# Patient Record
Sex: Female | Born: 1940
Health system: Southern US, Community
[De-identification: ages and names within clinical notes are randomized; demographics above are authoritative.]

## PROBLEM LIST (undated history)

## (undated) DIAGNOSIS — R490 Dysphonia: Secondary | ICD-10-CM

## (undated) DIAGNOSIS — E119 Type 2 diabetes mellitus without complications: Secondary | ICD-10-CM

## (undated) DIAGNOSIS — I469 Cardiac arrest, cause unspecified: Secondary | ICD-10-CM

## (undated) DIAGNOSIS — K529 Noninfective gastroenteritis and colitis, unspecified: Secondary | ICD-10-CM

## (undated) DIAGNOSIS — M1990 Unspecified osteoarthritis, unspecified site: Secondary | ICD-10-CM

## (undated) DIAGNOSIS — K219 Gastro-esophageal reflux disease without esophagitis: Secondary | ICD-10-CM

## (undated) DIAGNOSIS — N200 Calculus of kidney: Secondary | ICD-10-CM

## (undated) DIAGNOSIS — L409 Psoriasis, unspecified: Secondary | ICD-10-CM

## (undated) DIAGNOSIS — Z860100 Personal history of colon polyps, unspecified: Secondary | ICD-10-CM

## (undated) DIAGNOSIS — Z8679 Personal history of other diseases of the circulatory system: Secondary | ICD-10-CM

## (undated) DIAGNOSIS — E538 Deficiency of other specified B group vitamins: Secondary | ICD-10-CM

## (undated) DIAGNOSIS — G629 Polyneuropathy, unspecified: Secondary | ICD-10-CM

## (undated) DIAGNOSIS — K5909 Other constipation: Secondary | ICD-10-CM

## (undated) DIAGNOSIS — I10 Essential (primary) hypertension: Secondary | ICD-10-CM

## (undated) DIAGNOSIS — Z8719 Personal history of other diseases of the digestive system: Secondary | ICD-10-CM

## (undated) DIAGNOSIS — Z87442 Personal history of urinary calculi: Secondary | ICD-10-CM

## (undated) HISTORY — PX: ESOPHAGOGASTRODUODENOSCOPY: SHX1529

## (undated) HISTORY — PX: FLEXIBLE SIGMOIDOSCOPY: SHX1649

## (undated) HISTORY — PX: ABDOMINAL HYSTERECTOMY: SHX81

## (undated) HISTORY — DX: Gastro-esophageal reflux disease without esophagitis: K21.9

## (undated) HISTORY — PX: EYE SURGERY: SHX253

---

## 2003-01-29 HISTORY — PX: BREAST SURGERY: SHX581

## 2003-01-29 HISTORY — PX: BREAST BIOPSY: SHX20

## 2004-12-03 ENCOUNTER — Emergency Department: Payer: Self-pay | Admitting: Emergency Medicine

## 2004-12-10 ENCOUNTER — Ambulatory Visit: Payer: Self-pay | Admitting: Unknown Physician Specialty

## 2005-01-06 ENCOUNTER — Ambulatory Visit: Payer: Self-pay

## 2005-01-12 ENCOUNTER — Ambulatory Visit: Payer: Self-pay

## 2005-02-05 ENCOUNTER — Ambulatory Visit: Payer: Self-pay | Admitting: Oncology

## 2005-02-27 ENCOUNTER — Ambulatory Visit: Payer: Self-pay | Admitting: Oncology

## 2006-02-09 ENCOUNTER — Ambulatory Visit: Payer: Self-pay | Admitting: Unknown Physician Specialty

## 2006-07-13 ENCOUNTER — Ambulatory Visit: Payer: Self-pay | Admitting: Unknown Physician Specialty

## 2007-03-03 ENCOUNTER — Ambulatory Visit: Payer: Self-pay | Admitting: Unknown Physician Specialty

## 2008-02-10 ENCOUNTER — Ambulatory Visit: Payer: Self-pay | Admitting: Cardiology

## 2008-03-27 ENCOUNTER — Ambulatory Visit: Payer: Self-pay | Admitting: Unknown Physician Specialty

## 2008-08-14 ENCOUNTER — Ambulatory Visit: Payer: Self-pay | Admitting: Cardiology

## 2008-08-14 ENCOUNTER — Ambulatory Visit: Payer: Self-pay | Admitting: Ophthalmology

## 2008-08-28 ENCOUNTER — Ambulatory Visit: Payer: Self-pay | Admitting: Ophthalmology

## 2008-10-18 ENCOUNTER — Ambulatory Visit: Payer: Self-pay | Admitting: Ophthalmology

## 2008-10-22 ENCOUNTER — Ambulatory Visit: Payer: Self-pay | Admitting: Orthopedic Surgery

## 2008-11-27 ENCOUNTER — Ambulatory Visit: Payer: Self-pay | Admitting: Ophthalmology

## 2009-04-02 ENCOUNTER — Ambulatory Visit: Payer: Self-pay | Admitting: Unknown Physician Specialty

## 2009-05-07 ENCOUNTER — Ambulatory Visit: Payer: Self-pay | Admitting: Unknown Physician Specialty

## 2009-05-28 HISTORY — PX: HERNIA REPAIR: SHX51

## 2009-12-27 ENCOUNTER — Ambulatory Visit: Payer: Self-pay | Admitting: Unknown Physician Specialty

## 2010-04-22 ENCOUNTER — Ambulatory Visit: Payer: Self-pay | Admitting: Unknown Physician Specialty

## 2011-02-05 ENCOUNTER — Emergency Department: Payer: Self-pay | Admitting: Emergency Medicine

## 2011-03-31 HISTORY — PX: COLONOSCOPY: SHX174

## 2011-05-14 ENCOUNTER — Ambulatory Visit: Payer: Self-pay | Admitting: Unknown Physician Specialty

## 2011-05-25 ENCOUNTER — Ambulatory Visit: Payer: Self-pay | Admitting: Unknown Physician Specialty

## 2011-07-21 ENCOUNTER — Ambulatory Visit: Payer: Self-pay | Admitting: Unknown Physician Specialty

## 2011-09-10 ENCOUNTER — Ambulatory Visit: Payer: Self-pay | Admitting: Family Medicine

## 2012-03-30 HISTORY — PX: KNEE ARTHROSCOPY: SUR90

## 2012-04-14 ENCOUNTER — Ambulatory Visit: Payer: Self-pay | Admitting: General Practice

## 2012-05-05 ENCOUNTER — Ambulatory Visit: Payer: Self-pay | Admitting: General Practice

## 2012-05-05 ENCOUNTER — Other Ambulatory Visit: Payer: Self-pay | Admitting: Anesthesiology

## 2012-06-02 ENCOUNTER — Ambulatory Visit: Payer: Self-pay | Admitting: General Practice

## 2012-07-20 ENCOUNTER — Ambulatory Visit: Payer: Self-pay | Admitting: Family Medicine

## 2012-07-21 ENCOUNTER — Ambulatory Visit: Payer: Self-pay | Admitting: Family Medicine

## 2012-07-26 ENCOUNTER — Other Ambulatory Visit: Payer: Self-pay | Admitting: Unknown Physician Specialty

## 2012-08-09 ENCOUNTER — Other Ambulatory Visit: Payer: Self-pay

## 2012-08-09 ENCOUNTER — Encounter: Payer: Self-pay | Admitting: General Surgery

## 2012-08-09 ENCOUNTER — Ambulatory Visit (INDEPENDENT_AMBULATORY_CARE_PROVIDER_SITE_OTHER): Payer: Medicare Other | Admitting: General Surgery

## 2012-08-09 VITALS — BP 122/78 | HR 82 | Resp 14 | Ht 63.0 in | Wt 155.0 lb

## 2012-08-09 DIAGNOSIS — N644 Mastodynia: Secondary | ICD-10-CM

## 2012-08-09 DIAGNOSIS — R928 Other abnormal and inconclusive findings on diagnostic imaging of breast: Secondary | ICD-10-CM

## 2012-08-09 NOTE — Patient Instructions (Addendum)
Continue self breast exams. Call office for any new breast issues or concerns. Plan Left Breast biopsy The breast biopsy procedure was reviewed with the patient. The potential for bleeding, infection, and pain was reviewed. At this time, the benefits outweigh the risk and the patient is amenable to proceed.

## 2012-08-09 NOTE — Progress Notes (Signed)
Patient ID: Ashley Tanner, female   DOB: 09-15-1940, 72 y.o.   MRN: [id number]  Chief Complaint  Patient presents with  . Breast Problem    abnormal mammogram    HPI Ashley Tanner is a 72 y.o. female. Patient states that last year Feb 2013, there was thickening behind left nipple with mammograms and some pain and no biopsy was done. She states she is still having some left breast pain but no nipple drainage. No activities make the pain worse, at times "lighting bolt". Denies any injuries to the breast.  Denies family history of breast cancer. Denies any breast changes during her self checks. She has a known history of left breast stereotactic biopsy completed November 2004 showed fibrocystic changes and ductal adenosis.   The patient reports migratory discomfort throughout the left breast it's been going on for about 18 months. It does not appear to be increasing in severity or frequency. There was some concern about some mobile thickening behind and around the left areola.  The patient is accompanied today by a close friend, Rica Koyanagi, who was present for the interview and exam.   Brother with lung cancer.   HPI  Past Medical History  Diagnosis Date  . GERD (gastroesophageal reflux disease)     Past Surgical History  Procedure Laterality Date  . Hernia repair  March 2011    [facility]  . Knee arthroscopy Left 2014  . Colonoscopy  2013    Dr Mechele Collin  . Breast surgery Left November 2004    Fibrocystic changes, ductal adenosis, microcalcifications    No family history on file.  Social History History  Substance Use Topics  . Smoking status: Never Smoker   . Smokeless tobacco: Never Used  . Alcohol Use: No    No Known Allergies  Current Outpatient Prescriptions  Medication Sig Dispense Refill  . vitamin B-12 (CYANOCOBALAMIN) 1000 MCG tablet Take 1,000 mcg by mouth daily.      Marland Kitchen omeprazole (PRILOSEC) 40 MG capsule Take by mouth daily.        No current  facility-administered medications for this visit.    Review of Systems Review of Systems  Constitutional: Negative.   Respiratory: Negative.   Cardiovascular: Negative.     Blood pressure 122/78, pulse 82, resp. rate 14, height 5\' 3"  (1.6 m), weight 155 lb (70.308 kg).  Physical Exam Physical Exam  Constitutional: She is oriented to person, place, and time. She appears well-developed and well-nourished.  Cardiovascular: Normal rate and regular rhythm.   Pulmonary/Chest: Effort normal and breath sounds normal. Right breast exhibits no inverted nipple, no mass, no nipple discharge, no skin change and no tenderness. Left breast exhibits no inverted nipple, no mass, no nipple discharge, no skin change and no tenderness.  Lymphadenopathy:    She has no cervical adenopathy.    She has no axillary adenopathy.  Neurological: She is alert and oriented to person, place, and time.  Skin: Skin is warm and dry.  It was not possible to reproduce the patient's reported breast discomfort in either the seated or supine position.  No tenderness to AP or lateral chest wall compression.  Data Reviewed Bilateral mammograms dated 07/20/2012 were reviewed. There was reported asymmetrical density in the left breast for which additional views were requested. Focused ultrasound completed of the same day was unremarkable. The patient returned on 07/21/2012 for additional views. A persistent density was noted in the retroareolar area. Repeat focused ultrasound was completed showing an area of concern is  the 3:00 position which was reported as hypoechoic and some acoustic shadowing with an irregular border. This measured up to 0.75 cm in diameter. BI-RAD-4. Assessment    Migratory breast pain on the left without any clearly correlating mammographic, ultrasound or physical finding.  Abnormal left breast ultrasound with focal 7-8 mm hypoechoic nodule the 3:00 position. This was confirmed on an office ultrasound (no  charge)    Plan    Arrangements will be made for ultrasound guided biopsy of the area of concern of the left breast in the near future. The procedure was reviewed in detail with the patient and her friend.       Earline Mayotte 08/10/2012, 6:47 AM

## 2012-08-10 ENCOUNTER — Encounter: Payer: Self-pay | Admitting: General Surgery

## 2012-08-10 DIAGNOSIS — R928 Other abnormal and inconclusive findings on diagnostic imaging of breast: Secondary | ICD-10-CM | POA: Insufficient documentation

## 2012-08-23 ENCOUNTER — Ambulatory Visit: Payer: Medicare Other | Admitting: General Surgery

## 2012-09-08 ENCOUNTER — Ambulatory Visit: Payer: Self-pay | Admitting: Unknown Physician Specialty

## 2012-09-12 ENCOUNTER — Encounter: Payer: Self-pay | Admitting: General Surgery

## 2012-09-12 ENCOUNTER — Ambulatory Visit (INDEPENDENT_AMBULATORY_CARE_PROVIDER_SITE_OTHER): Payer: Medicare Other | Admitting: General Surgery

## 2012-09-12 VITALS — BP 140/84 | HR 80 | Resp 16 | Ht 63.0 in | Wt 156.0 lb

## 2012-09-12 DIAGNOSIS — N63 Unspecified lump in unspecified breast: Secondary | ICD-10-CM | POA: Insufficient documentation

## 2012-09-12 NOTE — Progress Notes (Signed)
Patient ID: Ashley Tanner, female   DOB: May 24, 1940, 72 y.o.   MRN: [id number]  Chief Complaint  Patient presents with  . Procedure    left breast biopsy    HPI Ashley Tanner is a 72 y.o. female here today for an left breast Encore biopsy. The patient was evaluated earlier this month and found to have an abnormal ultrasound and was felt to be a candidate for vacuum biopsy.                                         HPI  Past Medical History  Diagnosis Date  . GERD (gastroesophageal reflux disease)   . Breast mass 2014    Past Surgical History  Procedure Laterality Date  . Hernia repair  March 2011    [facility], intrathoracic hiatal hernia  . Knee arthroscopy Left 2014  . Colonoscopy  2013    Dr Mechele Collin  . Breast surgery Left November 2004    Fibrocystic changes, ductal adenosis, microcalcifications    History reviewed. No pertinent family history.  Social History History  Substance Use Topics  . Smoking status: Never Smoker   . Smokeless tobacco: Never Used  . Alcohol Use: No    No Known Allergies  Current Outpatient Prescriptions  Medication Sig Dispense Refill  . omeprazole (PRILOSEC) 40 MG capsule Take by mouth daily.       . vitamin B-12 (CYANOCOBALAMIN) 1000 MCG tablet Take 1,000 mcg by mouth daily.       No current facility-administered medications for this visit.    Review of Systems Review of Systems  Constitutional: Negative.   Respiratory: Negative.   Cardiovascular: Negative.     Blood pressure 140/84, pulse 80, resp. rate 16, height 5\' 3"  (1.6 m), weight 156 lb (70.761 kg).  Physical Exam Physical Exam The left breast is unremarkable palpation. Data Reviewed Ultrasound examination of the left breast again shows a hypoechoic mass in the 3:00 position 2 cm from the nipple measuring 0.46 x 0.63 x 0.66 cm.  The patient was omental biopsy. Consent was obtained. 10 cc of 0.5% Xylocaine with 0.25% Marcaine with 1-2 and 1000 of epinephrine was utilized well  tolerated. Chlor prep was applied to the skin. A 10-gauge Encor device was utilized. The area was completely removed during the biopsy process. A postbiopsy clip was placed. The skin defect was closed with benzoin and Steri-Strips followed by Telfa Tegaderm dressing. Postoperative instructions were provided.  Assessment    Left breast mass     Plan    The patient will be contacted with the biopsy results are available. She will return in one week for nursing check and will plan for a followup mammogram in 6 months.        Ashley Tanner 09/13/2012, 8:28 PM

## 2012-09-12 NOTE — Patient Instructions (Addendum)

## 2012-09-13 ENCOUNTER — Telehealth: Payer: Self-pay | Admitting: General Surgery

## 2012-09-13 ENCOUNTER — Encounter: Payer: Self-pay | Admitting: General Surgery

## 2012-09-13 LAB — PATHOLOGY

## 2012-09-13 NOTE — Telephone Encounter (Signed)
Patient notified pathology was benign. ( Calcifications, stromal fibrosis and sclerosis adenosis.)

## 2012-09-19 ENCOUNTER — Ambulatory Visit: Payer: Self-pay | Admitting: Unknown Physician Specialty

## 2012-09-20 ENCOUNTER — Ambulatory Visit (INDEPENDENT_AMBULATORY_CARE_PROVIDER_SITE_OTHER): Payer: Medicare Other | Admitting: *Deleted

## 2012-09-20 DIAGNOSIS — N63 Unspecified lump in unspecified breast: Secondary | ICD-10-CM

## 2012-09-20 NOTE — Progress Notes (Signed)
Patient here today for follow up post left breast biopsy.  Dressing removed, steristrip in place and aware it may come off in one week.  Minimal bruising noted.  The patient is aware that a heating pad may be used for comfort as needed.  Aware of pathology. Follow up as scheduled. 

## 2012-09-20 NOTE — Patient Instructions (Addendum)
tba

## 2012-09-21 LAB — PATHOLOGY REPORT

## 2012-09-27 ENCOUNTER — Encounter: Payer: Self-pay | Admitting: General Surgery

## 2012-11-07 ENCOUNTER — Other Ambulatory Visit: Payer: Self-pay | Admitting: Unknown Physician Specialty

## 2012-11-07 LAB — CLOSTRIDIUM DIFFICILE BY PCR

## 2013-01-02 ENCOUNTER — Other Ambulatory Visit: Payer: Self-pay | Admitting: Gastroenterology

## 2013-01-02 LAB — CLOSTRIDIUM DIFFICILE BY PCR

## 2013-01-04 LAB — STOOL CULTURE

## 2013-09-27 ENCOUNTER — Ambulatory Visit: Payer: Self-pay | Admitting: Family Medicine

## 2013-11-27 ENCOUNTER — Emergency Department: Payer: Self-pay | Admitting: Student

## 2013-11-27 LAB — CBC
HCT: 37.7 % (ref 35.0–47.0)
HGB: 12.7 g/dL (ref 12.0–16.0)
MCH: 31.2 pg (ref 26.0–34.0)
MCHC: 33.6 g/dL (ref 32.0–36.0)
MCV: 93 fL (ref 80–100)
Platelet: 240 10*3/uL (ref 150–440)
RBC: 4.07 10*6/uL (ref 3.80–5.20)
RDW: 12.7 % (ref 11.5–14.5)
WBC: 14 10*3/uL — ABNORMAL HIGH (ref 3.6–11.0)

## 2013-11-27 LAB — COMPREHENSIVE METABOLIC PANEL
ALBUMIN: 3.6 g/dL (ref 3.4–5.0)
ALK PHOS: 92 U/L
Anion Gap: 5 — ABNORMAL LOW (ref 7–16)
BUN: 16 mg/dL (ref 7–18)
Bilirubin,Total: 1.3 mg/dL — ABNORMAL HIGH (ref 0.2–1.0)
CO2: 27 mmol/L (ref 21–32)
Calcium, Total: 8.7 mg/dL (ref 8.5–10.1)
Chloride: 108 mmol/L — ABNORMAL HIGH (ref 98–107)
Creatinine: 0.93 mg/dL (ref 0.60–1.30)
EGFR (African American): >60
Glucose: 132 mg/dL — ABNORMAL HIGH (ref 65–99)
Osmolality: 282 (ref 275–301)
Potassium: 4.2 mmol/L (ref 3.5–5.1)
SGOT(AST): 21 U/L (ref 15–37)
SGPT (ALT): 13 U/L — ABNORMAL LOW
SODIUM: 140 mmol/L (ref 136–145)
Total Protein: 6.8 g/dL (ref 6.4–8.2)

## 2013-11-27 LAB — URINALYSIS, COMPLETE
BILIRUBIN, UR: NEGATIVE
Bacteria: NONE SEEN
Glucose,UR: NEGATIVE mg/dL (ref 0–75)
Leukocyte Esterase: NEGATIVE
NITRITE: NEGATIVE
PH: 5 (ref 4.5–8.0)
PROTEIN: NEGATIVE
Specific Gravity: 1.016 (ref 1.003–1.030)
Squamous Epithelial: 1
WBC UR: 1 /HPF (ref 0–5)

## 2013-11-27 LAB — LIPASE, BLOOD: Lipase: 52 U/L — ABNORMAL LOW (ref 73–393)

## 2014-01-29 ENCOUNTER — Encounter: Payer: Self-pay | Admitting: General Surgery

## 2014-03-11 ENCOUNTER — Emergency Department: Payer: Self-pay | Admitting: Emergency Medicine

## 2014-03-11 LAB — COMPREHENSIVE METABOLIC PANEL
ALBUMIN: 3.5 g/dL (ref 3.4–5.0)
ALK PHOS: 99 U/L
ANION GAP: 7 (ref 7–16)
BUN: 23 mg/dL — AB (ref 7–18)
Bilirubin,Total: 0.8 mg/dL (ref 0.2–1.0)
CO2: 27 mmol/L (ref 21–32)
CREATININE: 0.9 mg/dL (ref 0.60–1.30)
Calcium, Total: 8.7 mg/dL (ref 8.5–10.1)
Chloride: 107 mmol/L (ref 98–107)
EGFR (Non-African Amer.): >60
Glucose: 169 mg/dL — ABNORMAL HIGH (ref 65–99)
Osmolality: 289 (ref 275–301)
Potassium: 4 mmol/L (ref 3.5–5.1)
SGOT(AST): 16 U/L (ref 15–37)
SGPT (ALT): 15 U/L
Sodium: 141 mmol/L (ref 136–145)
TOTAL PROTEIN: 7.1 g/dL (ref 6.4–8.2)

## 2014-03-11 LAB — CBC WITH DIFFERENTIAL/PLATELET
BASOS PCT: 0.5 %
Basophil #: 0.1 10*3/uL (ref 0.0–0.1)
Eosinophil #: 0.2 10*3/uL (ref 0.0–0.7)
Eosinophil %: 1.5 %
HCT: 37.5 % (ref 35.0–47.0)
HGB: 12.7 g/dL (ref 12.0–16.0)
Lymphocyte #: 3.2 10*3/uL (ref 1.0–3.6)
Lymphocyte %: 28.8 %
MCH: 31.4 pg (ref 26.0–34.0)
MCHC: 33.8 g/dL (ref 32.0–36.0)
MCV: 93 fL (ref 80–100)
MONO ABS: 0.9 x10 3/mm (ref 0.2–0.9)
Monocyte %: 7.7 %
Neutrophil #: 6.8 10*3/uL — ABNORMAL HIGH (ref 1.4–6.5)
Neutrophil %: 61.5 %
Platelet: 268 10*3/uL (ref 150–440)
RBC: 4.03 10*6/uL (ref 3.80–5.20)
RDW: 12.9 % (ref 11.5–14.5)
WBC: 11 10*3/uL (ref 3.6–11.0)

## 2014-03-11 LAB — URINALYSIS, COMPLETE
BACTERIA: NONE SEEN
BILIRUBIN, UR: NEGATIVE
Nitrite: NEGATIVE
PH: 6 (ref 4.5–8.0)
PROTEIN: NEGATIVE
Specific Gravity: 1.016 (ref 1.003–1.030)
Squamous Epithelial: 1

## 2014-07-20 NOTE — Op Note (Signed)
PATIENT NAME:  Ashley Tanner, Ashley Tanner MR#:  [id number] DATE OF BIRTH:  04/27/1940  DATE OF PROCEDURE:  06/02/2012  PREOPERATIVE DIAGNOSIS:  Internal derangement of the left knee.   POSTOPERATIVE DIAGNOSES:   1.  Tear of the posterior horn, medial meniscus, left knee.  2.  Tear of the posterior horn, lateral meniscus, left knee.  3.  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   PROCEDURES PERFORMED:  Left knee arthroscopy, partial medial and lateral meniscectomies, and medial chondroplasty.   SURGEON:  Laurice Record. Hooten, M.D.   ANESTHESIA:  General.   ESTIMATED BLOOD LOSS:  Minimal.   TOURNIQUET TIME:  Not used.   DRAINS:  None.   INDICATIONS FOR SURGERY:  The patient is a 74 year old female who has been seen for complaints of left knee pain.  MRI demonstrated findings consistent with meniscal pathology.  After discussion of the risks and benefits of surgical intervention, the patient expressed understanding of the risks and benefits and agreed with plans for surgical intervention.   PROCEDURE IN DETAIL:  The patient was brought into the operating room and, after adequate general anesthesia was achieved, a tourniquet was placed on the patient's left thigh and leg was placed in a legholder.  All bony prominences were well-padded.  The patient's left knee and leg were cleaned and prepped with alcohol and DuraPrep, draped in the usual sterile fashion.  A timeout was performed as per usual protocol.  The anticipated portal sites were injected with 0.25% Marcaine with epinephrine.  An anterolateral portal was created and a cannula was inserted.  The scope was inserted and the knee was distended with fluid using the Stryker pump.  The scope was advanced down the medial gutter into the medial compartment of the knee.  And under visualization with the scope, an anteromedial portal was created and hook probe was inserted.  Inspection of the medial compartment demonstrated a flap-type tear  along the posterior horn of the medial meniscus that had flipped under the femoral condyle.  This was reduced using the probe and then the tear was debrided using meniscal punches, a 4.5 mm shaver.  Almost full thickness of the meniscus was involved with this tear and the areas were further debrided and contoured using the 4.5 mm shaver.  Transition zone along the lateral portion of the meniscus was shaped using the 50 degree ArthroCare wand.  The remaining rim of meniscus was visualized and probed and felt to be stable.  There were some grade 3 changes of chondromalacia involving the medial femoral condyle and these were debrided using the 50 degree ArthroCare wand.  The scope was removed from the anterolateral portal and reinserted via the anteromedial portal so as to better visualize the lateral compartment.  The ACL was significantly attenuated, although several bundles were still intact.  No gross anterior translation was appreciated performing Lachman's test under visualization.  The scope was then advanced into the lateral compartment.  Articular surface was in good condition.  There was a tear along the posterior horn of the lateral meniscus and this was debrided using meniscal punches and a 4.5 mm shaver.  Final contouring of the lesion was performed using the 50 degree ArthroCare wand.  Some fraying was also noted along the more lateral aspect of the meniscus and this too was debrided using the ArthroCare wand.  Finally, the scope was positioned so as to visualize the patellofemoral articulation.  Good patellar tracking was noted.  Reasonably good articular surface was  noted.   The knee was irrigated with copious amounts of fluid and then suctioned dry.  The anterolateral portal was reapproximated using #3-0 nylon.  A combination of 0.25% Marcaine with epinephrine and 4 mg of morphine was injected via the scope.  The scope was removed and the anteromedial portal was reapproximated using #3-0 nylon.  A  sterile dressing was applied followed by application of ice wrap.    The patient tolerated the procedure well. She was transported to the recovery room in stable condition.     ____________________________ Laurice Record. Holley Bouche., MD jph:ea D: 06/02/2012 18:11:24 ET T: 06/02/2012 20:20:38 ET JOB#: [id number]  cc: Laurice Record. Holley Bouche., MD, <Dictator> Laurice Record Holley Bouche MD ELECTRONICALLY SIGNED 06/07/2012 18:21

## 2014-08-20 ENCOUNTER — Emergency Department
Admission: EM | Admit: 2014-08-20 | Discharge: 2014-08-20 | Disposition: A | Discharge status: Home / Self Care | Payer: Medicare Other | Attending: Emergency Medicine | Admitting: Emergency Medicine

## 2014-08-20 ENCOUNTER — Encounter: Payer: Self-pay | Admitting: *Deleted

## 2014-08-20 ENCOUNTER — Emergency Department: Payer: Medicare Other

## 2014-08-20 DIAGNOSIS — N200 Calculus of kidney: Secondary | ICD-10-CM

## 2014-08-20 DIAGNOSIS — F419 Anxiety disorder, unspecified: Secondary | ICD-10-CM | POA: Insufficient documentation

## 2014-08-20 DIAGNOSIS — Z79899 Other long term (current) drug therapy: Secondary | ICD-10-CM | POA: Diagnosis not present

## 2014-08-20 DIAGNOSIS — R109 Unspecified abdominal pain: Secondary | ICD-10-CM | POA: Diagnosis present

## 2014-08-20 HISTORY — DX: Calculus of kidney: N20.0

## 2014-08-20 HISTORY — DX: Unspecified osteoarthritis, unspecified site: M19.90

## 2014-08-20 LAB — CBC WITH DIFFERENTIAL/PLATELET
BASOS ABS: 0.3 10*3/uL — AB (ref 0–0.1)
BASOS PCT: 3 %
EOS ABS: 0 10*3/uL (ref 0–0.7)
EOS PCT: 0 %
HCT: 40.5 % (ref 35.0–47.0)
Hemoglobin: 13.6 g/dL (ref 12.0–16.0)
Lymphocytes Relative: 6 %
Lymphs Abs: 0.7 10*3/uL — ABNORMAL LOW (ref 1.0–3.6)
MCH: 30.7 pg (ref 26.0–34.0)
MCHC: 33.5 g/dL (ref 32.0–36.0)
MCV: 91.6 fL (ref 80.0–100.0)
MONO ABS: 0.5 10*3/uL (ref 0.2–0.9)
MONOS PCT: 4 %
NEUTROS ABS: 10.3 10*3/uL — AB (ref 1.4–6.5)
NEUTROS PCT: 87 %
PLATELETS: 252 10*3/uL (ref 150–440)
RBC: 4.42 MIL/uL (ref 3.80–5.20)
RDW: 12.6 % (ref 11.5–14.5)
WBC: 11.9 10*3/uL — ABNORMAL HIGH (ref 3.6–11.0)

## 2014-08-20 LAB — COMPREHENSIVE METABOLIC PANEL
ALK PHOS: 92 U/L (ref 38–126)
ALT: 11 U/L — AB (ref 14–54)
AST: 14 U/L — ABNORMAL LOW (ref 15–41)
Albumin: 4.3 g/dL (ref 3.5–5.0)
Anion gap: 9 (ref 5–15)
BILIRUBIN TOTAL: 1.5 mg/dL — AB (ref 0.3–1.2)
BUN: 26 mg/dL — ABNORMAL HIGH (ref 6–20)
CHLORIDE: 103 mmol/L (ref 101–111)
CO2: 24 mmol/L (ref 22–32)
Calcium: 9.2 mg/dL (ref 8.9–10.3)
Creatinine, Ser: 1.12 mg/dL — ABNORMAL HIGH (ref 0.44–1.00)
GFR calc Af Amer: 55 mL/min — ABNORMAL LOW (ref >60)
GFR calc non Af Amer: 48 mL/min — ABNORMAL LOW (ref >60)
GLUCOSE: 141 mg/dL — AB (ref 65–99)
POTASSIUM: 4.2 mmol/L (ref 3.5–5.1)
Sodium: 136 mmol/L (ref 135–145)
TOTAL PROTEIN: 7.5 g/dL (ref 6.5–8.1)

## 2014-08-20 LAB — TROPONIN I: Troponin I: <0.03 ng/mL (ref <0.031)

## 2014-08-20 LAB — URINALYSIS COMPLETE WITH MICROSCOPIC (ARMC ONLY)
BILIRUBIN URINE: NEGATIVE
GLUCOSE, UA: NEGATIVE mg/dL
LEUKOCYTES UA: NEGATIVE
Nitrite: NEGATIVE
PH: 5 (ref 5.0–8.0)
PROTEIN: NEGATIVE mg/dL
SPECIFIC GRAVITY, URINE: 1.023 (ref 1.005–1.030)

## 2014-08-20 LAB — LIPASE, BLOOD: Lipase: 28 U/L (ref 22–51)

## 2014-08-20 MED ORDER — TAMSULOSIN HCL 0.4 MG PO CAPS: 0.4000 mg | ORAL_CAPSULE | Freq: Every day | ORAL | Status: DC

## 2014-08-20 MED ORDER — KETOROLAC TROMETHAMINE 30 MG/ML IJ SOLN
INTRAMUSCULAR | Status: AC
  Administered 2014-08-20: 30 mg via INTRAVENOUS
  Filled 2014-08-20: qty 1

## 2014-08-20 MED ORDER — FENTANYL CITRATE (PF) 100 MCG/2ML IJ SOLN: 50.0000 ug | Freq: Once | INTRAMUSCULAR | Status: DC

## 2014-08-20 MED ORDER — KETOROLAC TROMETHAMINE 30 MG/ML IJ SOLN
30.0000 mg | Freq: Once | INTRAMUSCULAR | Status: AC
  Administered 2014-08-20: 30 mg via INTRAVENOUS

## 2014-08-20 MED ORDER — ONDANSETRON HCL 4 MG/2ML IJ SOLN
4.0000 mg | Freq: Once | INTRAMUSCULAR | Status: AC
  Administered 2014-08-20: 4 mg via INTRAVENOUS

## 2014-08-20 MED ORDER — OXYCODONE-ACETAMINOPHEN 5-325 MG PO TABS
1.0000 | ORAL_TABLET | Freq: Once | ORAL | Status: AC
  Administered 2014-08-20: 1 via ORAL

## 2014-08-20 MED ORDER — FENTANYL CITRATE (PF) 100 MCG/2ML IJ SOLN
INTRAMUSCULAR | Status: AC
  Filled 2014-08-20: qty 2

## 2014-08-20 MED ORDER — SODIUM CHLORIDE 0.9 % IV BOLUS (SEPSIS)
1000.0000 mL | Freq: Once | INTRAVENOUS | Status: AC
  Administered 2014-08-20: 1000 mL via INTRAVENOUS

## 2014-08-20 MED ORDER — OXYCODONE-ACETAMINOPHEN 5-325 MG PO TABS: 1.0000 | ORAL_TABLET | Freq: Once | ORAL | Status: DC

## 2014-08-20 MED ORDER — ONDANSETRON 4 MG PO TBDP
4.0000 mg | ORAL_TABLET | Freq: Once | ORAL | Status: AC
  Administered 2014-08-20: 4 mg via ORAL

## 2014-08-20 MED ORDER — ONDANSETRON HCL 4 MG/2ML IJ SOLN
INTRAMUSCULAR | Status: AC
  Administered 2014-08-20: 4 mg via INTRAVENOUS
  Filled 2014-08-20: qty 2

## 2014-08-20 MED ORDER — ONDANSETRON HCL 4 MG PO TABS: 4.0000 mg | ORAL_TABLET | Freq: Every day | ORAL | Status: AC | PRN

## 2014-08-20 MED ORDER — OXYCODONE-ACETAMINOPHEN 5-325 MG PO TABS: 1.0000 | ORAL_TABLET | Freq: Four times a day (QID) | ORAL | Status: DC | PRN

## 2014-08-20 MED ORDER — OXYCODONE-ACETAMINOPHEN 5-325 MG PO TABS
ORAL_TABLET | ORAL | Status: AC
  Administered 2014-08-20: 1 via ORAL
  Filled 2014-08-20: qty 1

## 2014-08-20 MED ORDER — FENTANYL CITRATE (PF) 100 MCG/2ML IJ SOLN
50.0000 ug | Freq: Once | INTRAMUSCULAR | Status: AC
  Administered 2014-08-20: 50 ug via INTRAVENOUS

## 2014-08-20 MED ORDER — ONDANSETRON 4 MG PO TBDP
ORAL_TABLET | ORAL | Status: AC
  Administered 2014-08-20: 4 mg via ORAL
  Filled 2014-08-20: qty 1

## 2014-08-20 NOTE — Discharge Instructions (Signed)

## 2014-08-20 NOTE — ED Provider Notes (Signed)
[facility] Emergency Department Provider Note  ____________________________________________  Time seen: Approximately 6:44 AM  I have reviewed the triage vital signs and the nursing notes.   HISTORY  Chief Complaint Flank Pain    HPI Ashley Tanner is a 74 y.o. female who comes in tonight with left flank pain. The patient reports that the pain started one week ago and eased off but started back yesterday and has worsened. The patient reports that she's had kidney stones and this feels exactly like her previous kidney stones. The patient has had some nausea and vomiting. She denies noticing blood in her urine. The pain radiates from her left flank all the way around to her bladder. The patient denies burning with urination or fevers. Her pain is a 10 out of 10 in intensity. The patient denies other abdominal pain and swelling headache or dizziness. The patient has had kidney stones in the past with her last being in December 2015 but she denies ever receiving surgery to break up her kidney stones.   Past Medical History  Diagnosis Date  . GERD (gastroesophageal reflux disease)   . Breast mass 2014  . Kidney stone   . Arthritis     Patient Active Problem List   Diagnosis Date Noted  . Breast mass   . Abnormal mammogram 08/10/2012    Past Surgical History  Procedure Laterality Date  . Hernia repair  March 2011    [facility], intrathoracic hiatal hernia  . Knee arthroscopy Left 2014  . Colonoscopy  2013    Dr Vira Agar  . Breast surgery Left November 2004    Fibrocystic changes, ductal adenosis, microcalcifications    Current Outpatient Rx  Name  Route  Sig  Dispense  Refill  . omeprazole (PRILOSEC) 40 MG capsule   Oral   Take by mouth daily.          . vitamin B-12 (CYANOCOBALAMIN) 1000 MCG tablet   Oral   Take 1,000 mcg by mouth daily.           Allergies Morphine and related  History reviewed. No pertinent family history.  Social  History History  Substance Use Topics  . Smoking status: Never Smoker   . Smokeless tobacco: Never Used  . Alcohol Use: No    Review of Systems Constitutional: No fever/chills Eyes: No visual changes. ENT: No sore throat. Cardiovascular: Denies chest pain. Respiratory: Denies shortness of breath. Gastrointestinal: Nausea and vomiting No abdominal pain.   Genitourinary: Negative for dysuria. Musculoskeletal: Left flank pain Skin: Negative for rash. Neurological: Negative for headaches, focal weakness or numbness.  10-point ROS otherwise negative.  ____________________________________________   PHYSICAL EXAM:  VITAL SIGNS: ED Triage Vitals  Enc Vitals Group     BP 08/20/14 0601 173/90 mmHg     Pulse Rate 08/20/14 0601 80     Resp 08/20/14 0601 20     Temp 08/20/14 0601 97.6 F (36.4 C)     Temp Source 08/20/14 0601 Oral     SpO2 08/20/14 0601 99 %     Weight 08/20/14 0601 142 lb (64.411 kg)     Height 08/20/14 0601 5\' 3"  (1.6 m)     Head Cir --      Peak Flow --      Pain Score 08/20/14 0604 10     Pain Loc --      Pain Edu? --      Excl. in [place]? --     Constitutional: Alert  and oriented. Well appearing and in water it distress. Eyes: Conjunctivae are normal. PERRL. EOMI. Head: Atraumatic. Nose: No congestion/rhinnorhea. Mouth/Throat: Mucous membranes are moist.  Oropharynx non-erythematous. Cardiovascular: Normal rate, regular rhythm. Grossly normal heart sounds.  Good peripheral circulation. Respiratory: Normal respiratory effort.  No retractions. Lungs CTAB. Gastrointestinal: Soft and nontender. No distention. Left CVA tenderness to palpation Genitourinary: Deferred Musculoskeletal: No lower extremity tenderness nor edema.   Neurologic:  Normal speech and language. No gross focal neurologic deficits are appreciated.  Skin:  Skin is warm, dry and intact. No rash noted. Psychiatric: Mood and affect are normal.   ____________________________________________    LABS (all labs ordered are listed, but only abnormal results are displayed)  Labs Reviewed  CBC WITH DIFFERENTIAL/PLATELET - Abnormal; Notable for the following:    WBC 11.9 (*)    Neutro Abs 10.3 (*)    Lymphs Abs 0.7 (*)    Basophils Absolute 0.3 (*)    All other components within normal limits  COMPREHENSIVE METABOLIC PANEL - Abnormal; Notable for the following:    Glucose, Bld 141 (*)    BUN 26 (*)    Creatinine, Ser 1.12 (*)    AST 14 (*)    ALT 11 (*)    Total Bilirubin 1.5 (*)    GFR calc non Af Amer 48 (*)    GFR calc Af Amer 55 (*)    All other components within normal limits  URINALYSIS COMPLETEWITH MICROSCOPIC ([facility])  - Abnormal; Notable for the following:    Color, Urine YELLOW (*)    APPearance CLEAR (*)    Ketones, ur 1+ (*)    Hgb urine dipstick 2+ (*)    Bacteria, UA RARE (*)    Squamous Epithelial / LPF 0-5 (*)    All other components within normal limits  LIPASE, BLOOD  TROPONIN I   ____________________________________________  EKG  None ____________________________________________  RADIOLOGY  CT abdomen and pelvis: Mild left hydronephrosis and left hydroureter, mild left perinephric stranding, 4.4 mm calcified obstructive calculus in distal left ureter about 1.5 cm above UVJ, bilateral nonobstructive nephrolithiasis ____________________________________________   PROCEDURES  Procedure(s) performed: None  Critical Care performed: No  ____________________________________________   INITIAL IMPRESSION / ASSESSMENT AND PLAN / ED COURSE  Pertinent labs & imaging results that were available during my care of the patient were reviewed by me and considered in my medical decision making (see chart for details).  The patient is a 74 year old female who came in with left flank pain that has been there all week but has been worsening since yesterday. We will check the patient's urine as well as her creatinine and do a CT scan to determine where and  the size of the patient's kidney stone. The patient did receive a dose of fentanyl 50 g, Zofran and Toradol 30 mg IV 1.  ----------------------------------------- 8:19 AM on 08/20/2014 -----------------------------------------  The patient reports that her pain is a 3 out of 10 in intensity. I will give the patient a liter of normal saline as well as some Percocet and if the patient's pain continues to feel good I will discharge her home to follow-up with Dr. Elnoria Howard. ____________________________________________   FINAL CLINICAL IMPRESSION(S) / ED DIAGNOSES  Final diagnoses:  None      Loney Hering, MD 08/20/14 [phone]

## 2014-08-20 NOTE — ED Notes (Signed)
Pt c/o L flank pain starting last night at 1900, worsening over time. Pt states flank pain last week, but it decreased w/o intervention. Pt has hx of kidney stones. Pt is pale, nauseated, and visibly distressed in triage.

## 2014-08-20 NOTE — ED Provider Notes (Signed)
-----------------------------------------   8:57 AM on 08/20/2014 ----------------------------------------- Accepted from Dr. Dahlia Client. Awaiting pain control for patient to be discharged.  Patient is highly anxious about trying Percocet, because she is nauseated due to codeine. She is unsure what pain medication she's used in the past when she's had 4 prior kidney stones. She will try here prior to going home. Daughter at the bedside was also updated.  Patient discharged after fluids and pain control.  Diagnosis: Left kidney stone, acute  Ashley Roca, MD 08/20/14 [phone]

## 2014-08-20 NOTE — ED Notes (Signed)
Pt states her pain has improved since pain meds given.the patient denies any needs at present. Awaiting CT, will continue to monitor the pt.

## 2014-11-05 ENCOUNTER — Other Ambulatory Visit: Payer: Self-pay | Admitting: Family Medicine

## 2014-11-05 DIAGNOSIS — Z1231 Encounter for screening mammogram for malignant neoplasm of breast: Secondary | ICD-10-CM

## 2014-11-09 ENCOUNTER — Other Ambulatory Visit: Payer: Self-pay | Admitting: Family Medicine

## 2014-11-09 ENCOUNTER — Ambulatory Visit
Admission: RE | Admit: 2014-11-09 | Discharge: 2014-11-09 | Disposition: A | Discharge status: Home / Self Care | Payer: Medicare Other | Source: Ambulatory Visit | Attending: Family Medicine | Admitting: Family Medicine

## 2014-11-09 DIAGNOSIS — Z1231 Encounter for screening mammogram for malignant neoplasm of breast: Secondary | ICD-10-CM | POA: Diagnosis present

## 2015-06-10 DIAGNOSIS — Z0000 Encounter for general adult medical examination without abnormal findings: Secondary | ICD-10-CM | POA: Diagnosis not present

## 2015-06-10 DIAGNOSIS — Z7185 Encounter for immunization safety counseling: Secondary | ICD-10-CM | POA: Insufficient documentation

## 2015-06-14 DIAGNOSIS — Z0000 Encounter for general adult medical examination without abnormal findings: Secondary | ICD-10-CM | POA: Diagnosis not present

## 2015-06-17 DIAGNOSIS — R7303 Prediabetes: Secondary | ICD-10-CM | POA: Insufficient documentation

## 2015-10-10 DIAGNOSIS — F430 Acute stress reaction: Secondary | ICD-10-CM | POA: Diagnosis not present

## 2015-10-10 DIAGNOSIS — I10 Essential (primary) hypertension: Secondary | ICD-10-CM | POA: Diagnosis not present

## 2015-11-21 DIAGNOSIS — F430 Acute stress reaction: Secondary | ICD-10-CM | POA: Diagnosis not present

## 2015-11-21 DIAGNOSIS — I10 Essential (primary) hypertension: Secondary | ICD-10-CM | POA: Diagnosis not present

## 2015-11-21 DIAGNOSIS — R7303 Prediabetes: Secondary | ICD-10-CM | POA: Diagnosis not present

## 2015-12-10 ENCOUNTER — Other Ambulatory Visit: Payer: Self-pay | Admitting: Nurse Practitioner

## 2015-12-10 DIAGNOSIS — R490 Dysphonia: Secondary | ICD-10-CM | POA: Insufficient documentation

## 2015-12-10 DIAGNOSIS — K219 Gastro-esophageal reflux disease without esophagitis: Secondary | ICD-10-CM

## 2015-12-16 ENCOUNTER — Ambulatory Visit
Admission: RE | Admit: 2015-12-16 | Discharge: 2015-12-16 | Disposition: A | Discharge status: Home / Self Care | Payer: PPO | Source: Ambulatory Visit | Attending: Nurse Practitioner | Admitting: Nurse Practitioner

## 2015-12-16 DIAGNOSIS — Z9889 Other specified postprocedural states: Secondary | ICD-10-CM | POA: Diagnosis not present

## 2015-12-16 DIAGNOSIS — K219 Gastro-esophageal reflux disease without esophagitis: Secondary | ICD-10-CM | POA: Diagnosis not present

## 2015-12-16 DIAGNOSIS — K449 Diaphragmatic hernia without obstruction or gangrene: Secondary | ICD-10-CM | POA: Diagnosis not present

## 2016-01-10 ENCOUNTER — Other Ambulatory Visit: Payer: Self-pay | Admitting: Family Medicine

## 2016-01-10 DIAGNOSIS — Z1231 Encounter for screening mammogram for malignant neoplasm of breast: Secondary | ICD-10-CM

## 2016-01-30 DIAGNOSIS — K219 Gastro-esophageal reflux disease without esophagitis: Secondary | ICD-10-CM | POA: Diagnosis not present

## 2016-01-30 DIAGNOSIS — K449 Diaphragmatic hernia without obstruction or gangrene: Secondary | ICD-10-CM | POA: Diagnosis present

## 2016-01-30 DIAGNOSIS — R490 Dysphonia: Secondary | ICD-10-CM | POA: Diagnosis not present

## 2016-02-11 ENCOUNTER — Ambulatory Visit
Admission: RE | Admit: 2016-02-11 | Discharge: 2016-02-11 | Disposition: A | Discharge status: Home / Self Care | Payer: PPO | Source: Ambulatory Visit | Attending: Family Medicine | Admitting: Family Medicine

## 2016-02-11 DIAGNOSIS — Z1231 Encounter for screening mammogram for malignant neoplasm of breast: Secondary | ICD-10-CM | POA: Diagnosis not present

## 2016-02-12 DIAGNOSIS — I10 Essential (primary) hypertension: Secondary | ICD-10-CM | POA: Diagnosis not present

## 2016-02-12 DIAGNOSIS — R7303 Prediabetes: Secondary | ICD-10-CM | POA: Diagnosis not present

## 2016-02-19 DIAGNOSIS — R7303 Prediabetes: Secondary | ICD-10-CM | POA: Diagnosis not present

## 2016-02-19 DIAGNOSIS — K219 Gastro-esophageal reflux disease without esophagitis: Secondary | ICD-10-CM | POA: Diagnosis not present

## 2016-02-19 DIAGNOSIS — I10 Essential (primary) hypertension: Secondary | ICD-10-CM | POA: Diagnosis not present

## 2016-02-25 DIAGNOSIS — K219 Gastro-esophageal reflux disease without esophagitis: Secondary | ICD-10-CM | POA: Diagnosis not present

## 2016-02-25 DIAGNOSIS — R490 Dysphonia: Secondary | ICD-10-CM | POA: Diagnosis not present

## 2016-06-11 DIAGNOSIS — R7303 Prediabetes: Secondary | ICD-10-CM | POA: Diagnosis not present

## 2016-06-11 DIAGNOSIS — I10 Essential (primary) hypertension: Secondary | ICD-10-CM | POA: Diagnosis not present

## 2016-06-11 DIAGNOSIS — K219 Gastro-esophageal reflux disease without esophagitis: Secondary | ICD-10-CM | POA: Diagnosis not present

## 2016-06-16 DIAGNOSIS — F418 Other specified anxiety disorders: Secondary | ICD-10-CM | POA: Insufficient documentation

## 2016-06-16 DIAGNOSIS — I10 Essential (primary) hypertension: Secondary | ICD-10-CM | POA: Diagnosis not present

## 2016-06-16 DIAGNOSIS — Z0000 Encounter for general adult medical examination without abnormal findings: Secondary | ICD-10-CM | POA: Insufficient documentation

## 2016-06-16 DIAGNOSIS — R7303 Prediabetes: Secondary | ICD-10-CM | POA: Diagnosis not present

## 2016-12-21 DIAGNOSIS — J069 Acute upper respiratory infection, unspecified: Secondary | ICD-10-CM | POA: Diagnosis not present

## 2016-12-21 DIAGNOSIS — F4321 Adjustment disorder with depressed mood: Secondary | ICD-10-CM | POA: Diagnosis not present

## 2016-12-21 DIAGNOSIS — R05 Cough: Secondary | ICD-10-CM | POA: Diagnosis not present

## 2017-02-08 ENCOUNTER — Other Ambulatory Visit: Payer: Self-pay | Admitting: Family Medicine

## 2017-02-08 DIAGNOSIS — Z1231 Encounter for screening mammogram for malignant neoplasm of breast: Secondary | ICD-10-CM

## 2017-03-03 ENCOUNTER — Ambulatory Visit
Admission: RE | Admit: 2017-03-03 | Discharge: 2017-03-03 | Disposition: A | Discharge status: Home / Self Care | Payer: PPO | Source: Ambulatory Visit | Attending: Family Medicine | Admitting: Family Medicine

## 2017-03-03 DIAGNOSIS — Z1231 Encounter for screening mammogram for malignant neoplasm of breast: Secondary | ICD-10-CM | POA: Diagnosis not present

## 2017-03-09 ENCOUNTER — Other Ambulatory Visit: Payer: Self-pay | Admitting: Family Medicine

## 2017-03-09 DIAGNOSIS — R928 Other abnormal and inconclusive findings on diagnostic imaging of breast: Secondary | ICD-10-CM

## 2017-03-09 DIAGNOSIS — N6320 Unspecified lump in the left breast, unspecified quadrant: Secondary | ICD-10-CM

## 2017-03-18 ENCOUNTER — Ambulatory Visit
Admission: RE | Admit: 2017-03-18 | Discharge: 2017-03-18 | Disposition: A | Discharge status: Home / Self Care | Payer: PPO | Source: Ambulatory Visit | Attending: Family Medicine | Admitting: Family Medicine

## 2017-03-18 DIAGNOSIS — N6489 Other specified disorders of breast: Secondary | ICD-10-CM | POA: Diagnosis not present

## 2017-03-18 DIAGNOSIS — N6320 Unspecified lump in the left breast, unspecified quadrant: Secondary | ICD-10-CM | POA: Diagnosis not present

## 2017-03-18 DIAGNOSIS — R928 Other abnormal and inconclusive findings on diagnostic imaging of breast: Secondary | ICD-10-CM

## 2017-03-24 ENCOUNTER — Other Ambulatory Visit: Payer: Self-pay | Admitting: Family Medicine

## 2017-03-24 DIAGNOSIS — R928 Other abnormal and inconclusive findings on diagnostic imaging of breast: Secondary | ICD-10-CM

## 2017-03-24 DIAGNOSIS — N6320 Unspecified lump in the left breast, unspecified quadrant: Secondary | ICD-10-CM

## 2017-03-29 ENCOUNTER — Ambulatory Visit
Admission: RE | Admit: 2017-03-29 | Discharge: 2017-03-29 | Disposition: A | Discharge status: Home / Self Care | Payer: PPO | Source: Ambulatory Visit | Attending: Family Medicine | Admitting: Family Medicine

## 2017-03-29 DIAGNOSIS — N641 Fat necrosis of breast: Secondary | ICD-10-CM | POA: Insufficient documentation

## 2017-03-29 DIAGNOSIS — R928 Other abnormal and inconclusive findings on diagnostic imaging of breast: Secondary | ICD-10-CM

## 2017-03-29 DIAGNOSIS — N6320 Unspecified lump in the left breast, unspecified quadrant: Secondary | ICD-10-CM

## 2017-03-29 DIAGNOSIS — N6322 Unspecified lump in the left breast, upper inner quadrant: Secondary | ICD-10-CM | POA: Diagnosis not present

## 2017-03-29 HISTORY — PX: BREAST BIOPSY: SHX20

## 2017-04-01 LAB — SURGICAL PATHOLOGY

## 2017-06-14 DIAGNOSIS — I10 Essential (primary) hypertension: Secondary | ICD-10-CM | POA: Diagnosis not present

## 2017-06-14 DIAGNOSIS — Z136 Encounter for screening for cardiovascular disorders: Secondary | ICD-10-CM | POA: Diagnosis not present

## 2017-06-14 DIAGNOSIS — R7303 Prediabetes: Secondary | ICD-10-CM | POA: Diagnosis not present

## 2017-06-14 DIAGNOSIS — Z0000 Encounter for general adult medical examination without abnormal findings: Secondary | ICD-10-CM | POA: Diagnosis not present

## 2017-06-21 ENCOUNTER — Other Ambulatory Visit: Payer: Self-pay | Admitting: Family Medicine

## 2017-06-21 DIAGNOSIS — Z86010 Personal history of colonic polyps: Secondary | ICD-10-CM | POA: Diagnosis not present

## 2017-06-21 DIAGNOSIS — R1031 Right lower quadrant pain: Secondary | ICD-10-CM

## 2017-06-21 DIAGNOSIS — R7303 Prediabetes: Secondary | ICD-10-CM | POA: Diagnosis not present

## 2017-06-21 DIAGNOSIS — I10 Essential (primary) hypertension: Secondary | ICD-10-CM | POA: Diagnosis not present

## 2017-06-21 DIAGNOSIS — Z0000 Encounter for general adult medical examination without abnormal findings: Secondary | ICD-10-CM | POA: Diagnosis not present

## 2017-07-08 ENCOUNTER — Ambulatory Visit
Admission: RE | Admit: 2017-07-08 | Discharge: 2017-07-08 | Disposition: A | Discharge status: Home / Self Care | Payer: PPO | Source: Ambulatory Visit | Attending: Family Medicine | Admitting: Family Medicine

## 2017-07-08 DIAGNOSIS — N200 Calculus of kidney: Secondary | ICD-10-CM | POA: Diagnosis not present

## 2017-07-08 DIAGNOSIS — R1031 Right lower quadrant pain: Secondary | ICD-10-CM

## 2017-07-08 DIAGNOSIS — K8020 Calculus of gallbladder without cholecystitis without obstruction: Secondary | ICD-10-CM | POA: Diagnosis not present

## 2017-07-08 DIAGNOSIS — K449 Diaphragmatic hernia without obstruction or gangrene: Secondary | ICD-10-CM | POA: Insufficient documentation

## 2017-07-08 HISTORY — DX: Essential (primary) hypertension: I10

## 2017-07-08 MED ORDER — IOPAMIDOL (ISOVUE-300) INJECTION 61%
100.0000 mL | Freq: Once | INTRAVENOUS | Status: AC | PRN
  Administered 2017-07-08: 100 mL via INTRAVENOUS

## 2017-07-30 ENCOUNTER — Encounter: Payer: Self-pay | Admitting: Urology

## 2017-07-30 ENCOUNTER — Ambulatory Visit: Payer: PPO | Admitting: Urology

## 2017-07-30 VITALS — BP 125/82 | HR 80 | Ht 63.0 in | Wt 148.0 lb

## 2017-07-30 DIAGNOSIS — N200 Calculus of kidney: Secondary | ICD-10-CM | POA: Diagnosis not present

## 2017-07-30 NOTE — Progress Notes (Signed)
07/30/2017 12:15 PM   Ashley Tanner April 16, 1940 [id number]  Referring provider: Dion Body, MD [place] [facility] [place], [place] [address]  Chief Complaint  Patient presents with  . Nephrolithiasis    HPI: The patient is a 77 year old female who presents today for evaluation of a 1.1 cm right upper pole nonobstructing renal calculus.  This was seen on a CT abdomen pelvis with contrast in April 2019 that was performed for right lower quadrant abdominal pain.  No other stones were seen on the study.  There is no evidence of hydronephrosis.  Currently, the patient endorses no right flank pain.  Her right lower quadrant pain is resolved.  She denies problems with urination or dysuria.  No history of recurrent UTI.  She does have a history of nephrolithiasis x3.  She spontaneously passed all 3 stones and does not know stone composition.  Of note, the patient lost her husband approximately 8 months ago which has caused a large amount of stress in her life.   PMH: Past Medical History:  Diagnosis Date  . Arthritis   . GERD (gastroesophageal reflux disease)   . Hypertension   . Kidney stone     Surgical History: Past Surgical History:  Procedure Laterality Date  . BREAST BIOPSY Left 01/29/2003  . BREAST BIOPSY Left 03/29/2017   Affirm Bx- path pending  . BREAST SURGERY Left November 2004   Fibrocystic changes, ductal adenosis, microcalcifications  . COLONOSCOPY  2013   Dr Vira Agar  . HERNIA REPAIR  March 2011   [facility], intrathoracic hiatal hernia  . KNEE ARTHROSCOPY Left 2014    Home Medications:  Allergies as of 07/30/2017      Reactions   Codeine Nausea Only   Morphine And Related Nausea And Vomiting      Medication List        Accurate as of 07/30/17 12:15 PM. Always use your most recent med list.          calcium-vitamin D 500-200 MG-UNIT tablet Commonly known as:  OSCAL WITH D Take 1 tablet by mouth daily.   vitamin B-12 1000 MCG  tablet Commonly known as:  CYANOCOBALAMIN Take 1,000 mcg by mouth daily.       Allergies:  Allergies  Allergen Reactions  . Codeine Nausea Only  . Morphine And Related Nausea And Vomiting    Family History: Family History  Problem Relation Age of Onset  . Prostate cancer Neg Hx   . Bladder Cancer Neg Hx   . Kidney cancer Neg Hx     Social History:  reports that she has never smoked. She has never used smokeless tobacco. She reports that she does not drink alcohol or use drugs.  ROS: UROLOGY Frequent Urination?: No Hard to postpone urination?: No Burning/pain with urination?: No Get up at night to urinate?: Yes Leakage of urine?: No Urine stream starts and stops?: No Trouble starting stream?: No Do you have to strain to urinate?: No Blood in urine?: No Urinary tract infection?: No Sexually transmitted disease?: No Injury to kidneys or bladder?: No Painful intercourse?: No Weak stream?: No Currently pregnant?: No Vaginal bleeding?: No Last menstrual period?: n  Gastrointestinal Nausea?: No Vomiting?: No Indigestion/heartburn?: Yes Diarrhea?: No Constipation?: No  Constitutional Fever: No Night sweats?: Yes Weight loss?: No Fatigue?: No  Skin Skin rash/lesions?: No Itching?: No  Eyes Blurred vision?: No Double vision?: No  Ears/Nose/Throat Sore throat?: No Sinus problems?: No  Hematologic/Lymphatic Swollen glands?: No Easy bruising?:  No  Cardiovascular Leg swelling?: No Chest pain?: No  Respiratory Cough?: No Shortness of breath?: No  Endocrine Excessive thirst?: No  Musculoskeletal Back pain?: Yes Joint pain?: No  Neurological Headaches?: No Dizziness?: No  Psychologic Depression?: No Anxiety?: No  Physical Exam: BP 125/82 (BP Location: Right Arm, Patient Position: Sitting, Cuff Size: Normal)   Pulse 80   Ht 5\' 3"  (1.6 m)   Wt 148 lb (67.1 kg)   BMI 26.22 kg/m   Constitutional:  Alert and oriented, No acute  distress. HEENT: [place] AT, moist mucus membranes.  Trachea midline, no masses. Cardiovascular: No clubbing, cyanosis, or edema. Respiratory: Normal respiratory effort, no increased work of breathing. GI: Abdomen is soft, nontender, nondistended, no abdominal masses GU: No CVA tenderness.  Skin: No rashes, bruises or suspicious lesions. Lymph: No cervical or inguinal adenopathy. Neurologic: Grossly intact, no focal deficits, moving all 4 extremities. Psychiatric: Normal mood and affect.  Laboratory Data: Lab Results  Component Value Date   WBC 11.9 (H) 08/20/2014   HGB 13.6 08/20/2014   HCT 40.5 08/20/2014   MCV 91.6 08/20/2014   PLT 252 08/20/2014    Lab Results  Component Value Date   CREATININE 1.12 (H) 08/20/2014    No results found for: PSA  No results found for: TESTOSTERONE  No results found for: HGBA1C  Urinalysis    Component Value Date/Time   COLORURINE YELLOW (A) 08/20/2014 0637   APPEARANCEUR CLEAR (A) 08/20/2014 0637   APPEARANCEUR Clear 03/11/2014 2002   LABSPEC 1.023 08/20/2014 0637   LABSPEC 1.016 03/11/2014 2002   PHURINE 5.0 08/20/2014 0637   GLUCOSEU NEGATIVE 08/20/2014 0637   GLUCOSEU 50 mg/dL 03/11/2014 2002   HGBUR 2+ (A) 08/20/2014 [place] 08/20/2014 0637   BILIRUBINUR Negative 03/11/2014 2002   KETONESUR 1+ (A) 08/20/2014 0637   PROTEINUR NEGATIVE 08/20/2014 0637   NITRITE NEGATIVE 08/20/2014 0637   LEUKOCYTESUR NEGATIVE 08/20/2014 0637   LEUKOCYTESUR 1+ 03/11/2014 2002    Pertinent Imaging: CT abdomen and pelvis with contrast from April 2019 was reviewed (Tanner report and images).  Assessment & Plan:    1. Right 1.1 cm nonobstructing right upper pole calculus I discussed potential treatment options with the patient including active surveillance, lithotripsy, and ureteroscopy.  We discussed the risk, benefits, indications of all 3 options in great detail.  Specifically for active surveillance she was warned of the risk  of stone growth as well as the stone moving and becoming obstructive.  For now, given the stress in her life she would prefer not to undergo any surgical procedure.  We will have her follow-up in 6 months with a KUB prior to ensure the stone remains stable in size.  Return in about 6 months (around 01/30/2018) for KUB prior.  Nickie Retort, MD  [facility] [address], [place], [place] [address] [phone]

## 2017-07-30 NOTE — Addendum Note (Signed)
Addended by: Kyra Manges on: 07/30/2017 03:08 PM   Modules accepted: Orders

## 2017-08-17 DIAGNOSIS — K5901 Slow transit constipation: Secondary | ICD-10-CM | POA: Diagnosis not present

## 2017-08-17 DIAGNOSIS — Z86010 Personal history of colonic polyps: Secondary | ICD-10-CM | POA: Diagnosis not present

## 2017-08-19 DIAGNOSIS — K5901 Slow transit constipation: Secondary | ICD-10-CM | POA: Insufficient documentation

## 2017-08-25 ENCOUNTER — Other Ambulatory Visit: Payer: Self-pay | Admitting: Family Medicine

## 2017-08-25 DIAGNOSIS — N6321 Unspecified lump in the left breast, upper outer quadrant: Secondary | ICD-10-CM

## 2017-09-27 ENCOUNTER — Ambulatory Visit
Admission: RE | Admit: 2017-09-27 | Discharge: 2017-09-27 | Disposition: A | Discharge status: Home / Self Care | Payer: PPO | Source: Ambulatory Visit | Attending: Family Medicine | Admitting: Family Medicine

## 2017-09-27 DIAGNOSIS — N6321 Unspecified lump in the left breast, upper outer quadrant: Secondary | ICD-10-CM | POA: Diagnosis not present

## 2017-09-27 DIAGNOSIS — R922 Inconclusive mammogram: Secondary | ICD-10-CM | POA: Diagnosis not present

## 2017-10-28 ENCOUNTER — Encounter: Payer: Self-pay | Admitting: *Deleted

## 2017-10-29 ENCOUNTER — Ambulatory Visit: Payer: PPO | Admitting: Anesthesiology

## 2017-10-29 ENCOUNTER — Encounter
Admission: RE | Disposition: A | Discharge status: Home / Self Care | Payer: Self-pay | Source: Ambulatory Visit | Attending: Unknown Physician Specialty

## 2017-10-29 ENCOUNTER — Encounter: Payer: Self-pay | Admitting: *Deleted

## 2017-10-29 ENCOUNTER — Ambulatory Visit
Admission: RE | Admit: 2017-10-29 | Discharge: 2017-10-29 | Disposition: A | Discharge status: Home / Self Care | Payer: PPO | Source: Ambulatory Visit | Attending: Unknown Physician Specialty | Admitting: Unknown Physician Specialty

## 2017-10-29 DIAGNOSIS — E538 Deficiency of other specified B group vitamins: Secondary | ICD-10-CM | POA: Diagnosis not present

## 2017-10-29 DIAGNOSIS — D126 Benign neoplasm of colon, unspecified: Secondary | ICD-10-CM | POA: Diagnosis not present

## 2017-10-29 DIAGNOSIS — Z79899 Other long term (current) drug therapy: Secondary | ICD-10-CM | POA: Diagnosis not present

## 2017-10-29 DIAGNOSIS — K219 Gastro-esophageal reflux disease without esophagitis: Secondary | ICD-10-CM | POA: Insufficient documentation

## 2017-10-29 DIAGNOSIS — Z1211 Encounter for screening for malignant neoplasm of colon: Secondary | ICD-10-CM | POA: Insufficient documentation

## 2017-10-29 DIAGNOSIS — D122 Benign neoplasm of ascending colon: Secondary | ICD-10-CM | POA: Diagnosis not present

## 2017-10-29 DIAGNOSIS — E119 Type 2 diabetes mellitus without complications: Secondary | ICD-10-CM | POA: Insufficient documentation

## 2017-10-29 DIAGNOSIS — I10 Essential (primary) hypertension: Secondary | ICD-10-CM | POA: Insufficient documentation

## 2017-10-29 DIAGNOSIS — K635 Polyp of colon: Secondary | ICD-10-CM | POA: Diagnosis not present

## 2017-10-29 DIAGNOSIS — Z86010 Personal history of colonic polyps: Secondary | ICD-10-CM | POA: Diagnosis not present

## 2017-10-29 DIAGNOSIS — K640 First degree hemorrhoids: Secondary | ICD-10-CM | POA: Insufficient documentation

## 2017-10-29 DIAGNOSIS — K5790 Diverticulosis of intestine, part unspecified, without perforation or abscess without bleeding: Secondary | ICD-10-CM | POA: Diagnosis not present

## 2017-10-29 HISTORY — DX: Dysphonia: R49.0

## 2017-10-29 HISTORY — PX: COLONOSCOPY WITH PROPOFOL: SHX5780

## 2017-10-29 HISTORY — DX: Noninfective gastroenteritis and colitis, unspecified: K52.9

## 2017-10-29 HISTORY — DX: Personal history of other diseases of the circulatory system: Z86.79

## 2017-10-29 HISTORY — DX: Deficiency of other specified B group vitamins: E53.8

## 2017-10-29 HISTORY — DX: Personal history of colon polyps, unspecified: Z86.0100

## 2017-10-29 HISTORY — DX: Polyneuropathy, unspecified: G62.9

## 2017-10-29 HISTORY — DX: Unspecified osteoarthritis, unspecified site: M19.90

## 2017-10-29 HISTORY — DX: Personal history of other diseases of the digestive system: Z87.19

## 2017-10-29 HISTORY — DX: Type 2 diabetes mellitus without complications: E11.9

## 2017-10-29 HISTORY — DX: Psoriasis, unspecified: L40.9

## 2017-10-29 HISTORY — DX: Other constipation: K59.09

## 2017-10-29 HISTORY — DX: Personal history of colonic polyps: Z86.010

## 2017-10-29 SURGERY — COLONOSCOPY WITH PROPOFOL
Anesthesia: General

## 2017-10-29 MED ORDER — SODIUM CHLORIDE 0.9 % IV SOLN: INTRAVENOUS | Status: DC

## 2017-10-29 MED ORDER — PROPOFOL 500 MG/50ML IV EMUL
INTRAVENOUS | Status: DC | PRN
  Administered 2017-10-29: 120 ug/kg/min via INTRAVENOUS

## 2017-10-29 MED ORDER — PROPOFOL 500 MG/50ML IV EMUL
INTRAVENOUS | Status: AC
  Filled 2017-10-29: qty 50

## 2017-10-29 MED ORDER — LIDOCAINE HCL (PF) 2 % IJ SOLN
INTRAMUSCULAR | Status: AC
  Filled 2017-10-29: qty 10

## 2017-10-29 MED ORDER — SODIUM CHLORIDE 0.9 % IV SOLN
INTRAVENOUS | Status: DC
  Administered 2017-10-29: 1000 mL via INTRAVENOUS

## 2017-10-29 MED ORDER — PROPOFOL 10 MG/ML IV BOLUS
INTRAVENOUS | Status: DC | PRN
  Administered 2017-10-29: 33.1 mg via INTRAVENOUS
  Administered 2017-10-29: 20 mg via INTRAVENOUS

## 2017-10-29 MED ORDER — PROPOFOL 10 MG/ML IV BOLUS
INTRAVENOUS | Status: AC
  Filled 2017-10-29: qty 20

## 2017-10-29 NOTE — H&P (Signed)
Primary Care Physician:  Dion Body, MD Primary Gastroenterologist:  Dr. Vira Agar  Pre-Procedure History & Physical: HPI:  Ashley Tanner is a 77 y.o. female is here for an colonoscopy.  This for [place] colon polyps.   Past Medical History:  Diagnosis Date  . Arthritis   . B12 deficiency   . Chronic diarrhea   . Diabetes mellitus without complication ([place])   . GERD (gastroesophageal reflux disease)   . History of colon polyps   . History of hiatal hernia   . History of supraventricular tachycardia   . Hoarseness of voice   . Hypertension   . Kidney stone   . Osteoarthritis   . Other constipation   . Peripheral neuropathy   . Psoriasis     Past Surgical History:  Procedure Laterality Date  . ABDOMINAL HYSTERECTOMY    . BREAST BIOPSY Left 01/29/2003   Fibrocystic changes, ductal adenosis, microcalcifications  . BREAST BIOPSY Left 03/29/2017   Affirm Bx- SUPERIOR, CENTRAL, POSTERIOR; STEREOTACTIC BIOPSY: ORGANIZING FAT NECROSIS  . BREAST SURGERY Left November 2004   Fibrocystic changes, ductal adenosis, microcalcifications  . COLONOSCOPY  2013   Dr Vira Agar  . ESOPHAGOGASTRODUODENOSCOPY    . EYE SURGERY     Cataract surgery  . FLEXIBLE SIGMOIDOSCOPY    . HERNIA REPAIR  March 2011   [facility], intrathoracic hiatal hernia  . KNEE ARTHROSCOPY Left 2014    Prior to Admission medications   Medication Sig Start Date End Date Taking? Authorizing Provider  docusate sodium (COLACE) 100 MG capsule Take 100 mg by mouth 2 (two) times daily.   Yes [provider]  hydrochlorothiazide (HYDRODIURIL) 12.5 MG tablet Take 12.5 mg by mouth daily.   Yes [provider]  pantoprazole (PROTONIX) 40 MG tablet Take 40 mg by mouth daily.   Yes [provider]  calcium-vitamin D (OSCAL WITH D) 500-200 MG-UNIT per tablet Take 1 tablet by mouth daily.    [provider]  vitamin B-12 (CYANOCOBALAMIN) 1000 MCG tablet Take 1,000 mcg by mouth daily.    [provider]    Allergies as of 08/24/2017 - Review Complete 07/30/2017  Allergen Reaction Noted  . Codeine Nausea Only 08/20/2014  . Morphine and related Nausea And Vomiting 08/20/2014    Family History  Problem Relation Age of Onset  . Prostate cancer Neg Hx   . Bladder Cancer Neg Hx   . Kidney cancer Neg Hx     Social History   Socioeconomic History  . Marital status: Widowed    Spouse name: Not on file  . Number of children: Not on file  . Years of education: Not on file  . Highest education level: Not on file  Occupational History  . Not on file  Social Needs  . Financial resource strain: Not on file  . Food insecurity:    Worry: Not on file    Inability: Not on file  . Transportation needs:    Medical: Not on file    Non-medical: Not on file  Tobacco Use  . Smoking status: Never Smoker  . Smokeless tobacco: Never Used  Substance and Sexual Activity  . Alcohol use: No  . Drug use: No  . Sexual activity: Not on file    Comment: Married   Lifestyle  . Physical activity:    Days per week: Not on file    Minutes per session: Not on file  . Stress: Not on file  Relationships  . Social connections:  Talks on phone: Not on file    Gets together: Not on file    Attends religious service: Not on file    Active member of club or organization: Not on file    Attends meetings of clubs or organizations: Not on file    Relationship status: Not on file  . Intimate partner violence:    Fear of current or ex partner: Not on file    Emotionally abused: Not on file    Physically abused: Not on file    Forced sexual activity: Not on file  Other Topics Concern  . Not on file  Social History Narrative  . Not on file    Review of Systems: See HPI, otherwise negative ROS  Physical Exam: BP 129/72   Pulse 71   Temp (!) 97 F (36.1 C) (Tympanic)   Resp 16   Ht 5\' 3"  (1.6 m)   Wt 66.2 kg (146 lb)   SpO2 100%   BMI 25.86 kg/m  General:   Alert,  pleasant  and cooperative in NAD Head:  Normocephalic and atraumatic. Neck:  Supple; no masses or thyromegaly. Lungs:  Clear throughout to auscultation.    Heart:  Regular rate and rhythm. Abdomen:  Soft, nontender and nondistended. Normal bowel sounds, without guarding, and without rebound.   Neurologic:  Alert and  oriented x4;  grossly normal neurologically.  Impression/Plan: Ashley Tanner is here for an colonoscopy to be performed for [facility] clon polyps.  Risks, benefits, limitations, and alternatives regarding  colonoscopy have been reviewed with the patient.  Questions have been answered.  All parties agreeable.   Gaylyn Cheers, MD  10/29/2017, 11:40 AM

## 2017-10-29 NOTE — Anesthesia Preprocedure Evaluation (Signed)
Anesthesia Evaluation  Patient identified by MRN, date of birth, ID band Patient awake    Reviewed: Allergy & Precautions, NPO status , Patient's Chart, lab work & pertinent test results  History of Anesthesia Complications Negative for: history of anesthetic complications  Airway Mallampati: II       Dental   Pulmonary neg sleep apnea, neg COPD,           Cardiovascular hypertension, Pt. on medications (-) Past MI and (-) CHF (-) dysrhythmias (-) Valvular Problems/Murmurs     Neuro/Psych neg Seizures    GI/Hepatic Neg liver ROS, hiatal hernia, GERD  Medicated and Controlled,  Endo/Other  diabetes (borderline), Type 2  Renal/GU Renal disease (srtones)     Musculoskeletal   Abdominal   Peds  Hematology   Anesthesia Other Findings   Reproductive/Obstetrics                            Anesthesia Physical Anesthesia Plan  ASA: III  Anesthesia Plan: General   Post-op Pain Management:    Induction: Intravenous  PONV Risk Score and Plan: 3 and Propofol infusion, TIVA and Treatment may vary due to age or medical condition  Airway Management Planned: Nasal Cannula  Additional Equipment:   Intra-op Plan:   Post-operative Plan:   Informed Consent: I have reviewed the patients History and Physical, chart, labs and discussed the procedure including the risks, benefits and alternatives for the proposed anesthesia with the patient or authorized representative who has indicated his/her understanding and acceptance.     Plan Discussed with:   Anesthesia Plan Comments:         Anesthesia Quick Evaluation

## 2017-10-29 NOTE — Anesthesia Postprocedure Evaluation (Signed)
Anesthesia Post Note  Patient: Ashley Tanner  Procedure(s) Performed: COLONOSCOPY WITH PROPOFOL (N/A )  Patient location during evaluation: Endoscopy Anesthesia Type: General Level of consciousness: sedated Pain management: pain level controlled Vital Signs Assessment: post-procedure vital signs reviewed and stable Respiratory status: spontaneous breathing and respiratory function stable Cardiovascular status: stable Anesthetic complications: no     Last Vitals:  Vitals:   10/29/17 1112 10/29/17 1218  BP: 129/72   Pulse: 71   Resp: 16   Temp: (!) 36.1 C (!) 36 C  SpO2: 100%     Last Pain:  Vitals:   10/29/17 1218  TempSrc: Tympanic  PainSc: 0-No pain                 Shaylynne Lunt K

## 2017-10-29 NOTE — Op Note (Signed)
[facility] Patient Name: Ashley Tanner Procedure Date: 10/29/2017 11:34 AM MRN: [id number] Account #: [id number] Date of Birth: April 11, 1940 Admit Type: Outpatient Age: 77 Room: [facility] ENDO ROOM 1 Gender: Female Note Status: Finalized Procedure:            Colonoscopy Indications:          Screening for colorectal malignant neoplasm Providers:            Manya Silvas, MD Referring MD:         Dion Body (Referring MD) Medicines:            Propofol per Anesthesia Complications:        No immediate complications. Procedure:            Pre-Anesthesia Assessment:                       - After reviewing the risks and benefits, the patient                        was deemed in satisfactory condition to undergo the                        procedure.                       After obtaining informed consent, the colonoscope was                        passed under direct vision. Throughout the procedure,                        the patient's blood pressure, pulse, and oxygen                        saturations were monitored continuously. The                        Colonoscope was introduced through the anus and                        advanced to the the cecum, identified by appendiceal                        orifice and ileocecal valve. The colonoscopy was                        performed without difficulty. The patient tolerated the                        procedure well. The quality of the bowel preparation                        was good. Findings:      A diminutive polyp was found in the ascending colon. The polyp was       sessile. The polyp was removed with a jumbo cold forceps. Resection and       retrieval were complete.      A diminutive polyp was found in the ascending colon. The polyp was       sessile. The polyp was removed with a cold snare. Resection and  retrieval were complete.      A diminutive polyp was found in the  recto-sigmoid colon. The polyp was       sessile. The polyp was removed with a jumbo cold forceps. Resection and       retrieval were complete.      Internal hemorrhoids were found during endoscopy. The hemorrhoids were       small and Grade I (internal hemorrhoids that do not prolapse).      The exam was otherwise without abnormality. Impression:           - One diminutive polyp in the ascending colon, removed                        with a jumbo cold forceps. Resected and retrieved.                       - One diminutive polyp in the ascending colon, removed                        with a cold snare. Resected and retrieved.                       - One diminutive polyp at the recto-sigmoid colon,                        removed with a jumbo cold forceps. Resected and                        retrieved.                       - Internal hemorrhoids.                       - The examination was otherwise normal. Recommendation:       - Await pathology results. Manya Silvas, MD 10/29/2017 12:18:11 PM This report has been signed electronically. Number of Addenda: 0 Note Initiated On: 10/29/2017 11:34 AM Scope Withdrawal Time: 0 hours 13 minutes 50 seconds  Total Procedure Duration: 0 hours 26 minutes 5 seconds       [facility]

## 2017-10-29 NOTE — Transfer of Care (Signed)
Immediate Anesthesia Transfer of Care Note  Patient: Ashley Tanner  Procedure(s) Performed: COLONOSCOPY WITH PROPOFOL (N/A )  Patient Location: PACU and Endoscopy Unit  Anesthesia Type:General  Level of Consciousness: sedated  Airway & Oxygen Therapy: Patient Spontanous Breathing and Patient connected to nasal cannula oxygen  Post-op Assessment: Report given to RN and Post -op Vital signs reviewed and stable  Post vital signs: stable  Last Vitals:  Vitals Value Taken Time  BP 89/45 10/29/2017 12:17 PM  Temp    Pulse 64 10/29/2017 12:18 PM  Resp 14 10/29/2017 12:18 PM  SpO2 100 % 10/29/2017 12:18 PM  Vitals shown include unvalidated device data.  Last Pain:  Vitals:   10/29/17 1112  TempSrc: Tympanic  PainSc: 0-No pain         Complications: No apparent anesthesia complications

## 2017-10-29 NOTE — Anesthesia Post-op Follow-up Note (Signed)
Anesthesia QCDR form completed.        

## 2017-11-01 ENCOUNTER — Encounter: Payer: Self-pay | Admitting: Unknown Physician Specialty

## 2017-11-01 LAB — SURGICAL PATHOLOGY

## 2017-12-16 DIAGNOSIS — I10 Essential (primary) hypertension: Secondary | ICD-10-CM | POA: Diagnosis not present

## 2017-12-16 DIAGNOSIS — R7303 Prediabetes: Secondary | ICD-10-CM | POA: Diagnosis not present

## 2017-12-23 DIAGNOSIS — Z23 Encounter for immunization: Secondary | ICD-10-CM | POA: Diagnosis not present

## 2017-12-23 DIAGNOSIS — I10 Essential (primary) hypertension: Secondary | ICD-10-CM | POA: Diagnosis not present

## 2017-12-23 DIAGNOSIS — R7303 Prediabetes: Secondary | ICD-10-CM | POA: Diagnosis not present

## 2017-12-23 DIAGNOSIS — K219 Gastro-esophageal reflux disease without esophagitis: Secondary | ICD-10-CM | POA: Diagnosis not present

## 2018-02-02 ENCOUNTER — Ambulatory Visit
Admission: RE | Admit: 2018-02-02 | Discharge: 2018-02-02 | Disposition: A | Discharge status: Home / Self Care | Payer: PPO | Source: Ambulatory Visit | Attending: Urology | Admitting: Urology

## 2018-02-02 DIAGNOSIS — N200 Calculus of kidney: Secondary | ICD-10-CM

## 2018-02-03 ENCOUNTER — Encounter: Payer: Self-pay | Admitting: Urology

## 2018-02-03 ENCOUNTER — Ambulatory Visit: Payer: PPO | Admitting: Urology

## 2018-02-03 VITALS — BP 133/87 | HR 89 | Ht 63.0 in | Wt 144.6 lb

## 2018-02-03 DIAGNOSIS — N200 Calculus of kidney: Secondary | ICD-10-CM

## 2018-02-03 LAB — URINALYSIS, COMPLETE
BILIRUBIN UA: NEGATIVE
Glucose, UA: NEGATIVE
Ketones, UA: NEGATIVE
LEUKOCYTES UA: NEGATIVE
Nitrite, UA: NEGATIVE
PROTEIN UA: NEGATIVE
RBC UA: NEGATIVE
Specific Gravity, UA: 1.01 (ref 1.005–1.030)
Urobilinogen, Ur: 1 mg/dL (ref 0.2–1.0)
pH, UA: 5.5 (ref 5.0–7.5)

## 2018-02-03 LAB — MICROSCOPIC EXAMINATION
Epithelial Cells (non renal): NONE SEEN /hpf (ref 0–10)
RBC, UA: NONE SEEN /hpf (ref 0–2)

## 2018-02-03 NOTE — Progress Notes (Signed)
   02/03/2018 2:23 PM   Ashley Tanner [birth year] [id number]  Reason for visit: Follow up nephroilthiasis  HPI: I saw Ashley Tanner in urology clinic today in follow-up for nephrolithiasis.  She is a 77 year old female who was previously followed by Ashley Tanner.  She underwent a CT scan with contrast in April 2019 for abdominal pain which showed fecal impaction, as well as an incidental finding of a right nonobstructing 1.2 cm upper pole calculus.  She denies any flank pain or groin pain.  She unfortunately lost her husband within the last year which is been very difficult for her.  She does report a single episode of pink urine a few months ago that resolved spontaneously.  There were no blood clots.  She has never smoked before.  She has no history of any carcinogenic exposures.   ROS: Please see flowsheet from today's date for complete review of systems.  Physical Exam: BP 133/87 (BP Location: Left Arm, Patient Position: Sitting, Cuff Size: Normal)   Pulse 89   Ht 5\' 3"  (1.6 m)   Wt 144 lb 9.6 oz (65.6 kg)   BMI 25.61 kg/m    Constitutional:  Alert and oriented, No acute distress.  Elderly Respiratory: Normal respiratory effort, no increased work of breathing. GI: Abdomen is soft, nontender, nondistended, no abdominal masses GU: No CVA tenderness Skin: No rashes, bruises or suspicious lesions. Neurologic: Grossly intact, no focal deficits, moving all 4 extremities. Psychiatric: Normal mood and affect  Laboratory Data: Urinalysis today 0-5 WBCs, no RBCs, many bacteria, nitrite negative  Pertinent Imaging:  I have personally reviewed the KUB and prior CT.  Stable right 1.2 cm upper pole renal stone.  On CT, this has Hounsfield units of 1000, with skin to stone distance of 9 cm.  Clearly seen on KUB.  Assessment & Plan:   In summary, Ashley Tanner is a 77 year old female with non-obstructing and asymptomatic right 1.2 cm renal stone.  We discussed options for her stone including  surveillance, shockwave lithotripsy, and ureteroscopy.  She would like to continue with active surveillance, with plan for shockwave lithotripsy if this starts to enlarge or moves and causes her pain.  We discussed at length symptoms to watch for including flank pain, fever and chills, or recurrent hematuria.  We discussed common possible etiologies of hematuria including BPH, malignancy, urolithiasis, medical renal disease, and idiopathic. Standard workup recommended by the AUA includes imaging with CT urogram to assess the upper tracts, and cystoscopy. Cytology is performed on patient's with gross hematuria to look for malignant cells in the urine.  She elects to defer further work-up for gross hematuria at this time.  I think this is reasonable with her advanced age, absence of hematuria on exam today, and known upper pole stone.  We did discuss the small, but not 0, risk of missing a bladder tumor by deferring cystoscopy.  She understands these risks.  RTC in 1 year with KUB Call if she develops recurrent gross hematuria and re-discuss cystoscopy  [place], [place] [address] [place], [place] [address] [phone]

## 2018-02-16 ENCOUNTER — Other Ambulatory Visit: Payer: Self-pay | Admitting: Family Medicine

## 2018-02-16 DIAGNOSIS — Z1231 Encounter for screening mammogram for malignant neoplasm of breast: Secondary | ICD-10-CM

## 2018-04-06 ENCOUNTER — Ambulatory Visit
Admission: RE | Admit: 2018-04-06 | Discharge: 2018-04-06 | Disposition: A | Discharge status: Home / Self Care | Payer: PPO | Source: Ambulatory Visit | Attending: Family Medicine | Admitting: Family Medicine

## 2018-04-06 DIAGNOSIS — Z1231 Encounter for screening mammogram for malignant neoplasm of breast: Secondary | ICD-10-CM | POA: Diagnosis not present

## 2018-04-11 DIAGNOSIS — K449 Diaphragmatic hernia without obstruction or gangrene: Secondary | ICD-10-CM | POA: Diagnosis not present

## 2018-04-11 DIAGNOSIS — K219 Gastro-esophageal reflux disease without esophagitis: Secondary | ICD-10-CM | POA: Diagnosis not present

## 2018-04-19 ENCOUNTER — Emergency Department: Payer: PPO

## 2018-04-19 ENCOUNTER — Inpatient Hospital Stay
Admission: EM | Admit: 2018-04-19 | Discharge: 2018-04-21 | DRG: 392 | Disposition: A | Discharge status: Home / Self Care | Payer: PPO | Attending: Internal Medicine | Admitting: Internal Medicine

## 2018-04-19 ENCOUNTER — Other Ambulatory Visit: Payer: Self-pay

## 2018-04-19 DIAGNOSIS — Z86010 Personal history of colonic polyps: Secondary | ICD-10-CM | POA: Diagnosis not present

## 2018-04-19 DIAGNOSIS — E119 Type 2 diabetes mellitus without complications: Secondary | ICD-10-CM | POA: Diagnosis not present

## 2018-04-19 DIAGNOSIS — K5909 Other constipation: Secondary | ICD-10-CM | POA: Diagnosis not present

## 2018-04-19 DIAGNOSIS — E876 Hypokalemia: Secondary | ICD-10-CM | POA: Diagnosis not present

## 2018-04-19 DIAGNOSIS — Z888 Allergy status to other drugs, medicaments and biological substances status: Secondary | ICD-10-CM

## 2018-04-19 DIAGNOSIS — Z8719 Personal history of other diseases of the digestive system: Secondary | ICD-10-CM | POA: Diagnosis not present

## 2018-04-19 DIAGNOSIS — K5289 Other specified noninfective gastroenteritis and colitis: Secondary | ICD-10-CM | POA: Diagnosis not present

## 2018-04-19 DIAGNOSIS — Z8679 Personal history of other diseases of the circulatory system: Secondary | ICD-10-CM

## 2018-04-19 DIAGNOSIS — K6289 Other specified diseases of anus and rectum: Secondary | ICD-10-CM | POA: Diagnosis not present

## 2018-04-19 DIAGNOSIS — E1142 Type 2 diabetes mellitus with diabetic polyneuropathy: Secondary | ICD-10-CM | POA: Diagnosis present

## 2018-04-19 DIAGNOSIS — K5641 Fecal impaction: Secondary | ICD-10-CM | POA: Diagnosis present

## 2018-04-19 DIAGNOSIS — K449 Diaphragmatic hernia without obstruction or gangrene: Secondary | ICD-10-CM | POA: Diagnosis present

## 2018-04-19 DIAGNOSIS — I10 Essential (primary) hypertension: Secondary | ICD-10-CM | POA: Diagnosis present

## 2018-04-19 DIAGNOSIS — K5730 Diverticulosis of large intestine without perforation or abscess without bleeding: Secondary | ICD-10-CM | POA: Diagnosis not present

## 2018-04-19 DIAGNOSIS — Z885 Allergy status to narcotic agent status: Secondary | ICD-10-CM

## 2018-04-19 DIAGNOSIS — R52 Pain, unspecified: Secondary | ICD-10-CM | POA: Diagnosis not present

## 2018-04-19 DIAGNOSIS — K219 Gastro-esophageal reflux disease without esophagitis: Secondary | ICD-10-CM | POA: Diagnosis not present

## 2018-04-19 DIAGNOSIS — E538 Deficiency of other specified B group vitamins: Secondary | ICD-10-CM | POA: Diagnosis not present

## 2018-04-19 DIAGNOSIS — N200 Calculus of kidney: Secondary | ICD-10-CM | POA: Diagnosis not present

## 2018-04-19 DIAGNOSIS — Z79899 Other long term (current) drug therapy: Secondary | ICD-10-CM | POA: Diagnosis not present

## 2018-04-19 DIAGNOSIS — K529 Noninfective gastroenteritis and colitis, unspecified: Secondary | ICD-10-CM | POA: Diagnosis not present

## 2018-04-19 LAB — CBC
HCT: 38.6 % (ref 36.0–46.0)
Hemoglobin: 13.4 g/dL (ref 12.0–15.0)
MCH: 31.8 pg (ref 26.0–34.0)
MCHC: 34.7 g/dL (ref 30.0–36.0)
MCV: 91.5 fL (ref 80.0–100.0)
NRBC: 0 % (ref 0.0–0.2)
PLATELETS: 274 10*3/uL (ref 150–400)
RBC: 4.22 MIL/uL (ref 3.87–5.11)
RDW: 12.4 % (ref 11.5–15.5)
WBC: 12.8 10*3/uL — AB (ref 4.0–10.5)

## 2018-04-19 LAB — LIPASE, BLOOD: Lipase: 20 U/L (ref 11–51)

## 2018-04-19 LAB — COMPREHENSIVE METABOLIC PANEL
ALT: 10 U/L (ref 0–44)
ANION GAP: 9 (ref 5–15)
AST: 15 U/L (ref 15–41)
Albumin: 3.9 g/dL (ref 3.5–5.0)
Alkaline Phosphatase: 92 U/L (ref 38–126)
BUN: 16 mg/dL (ref 8–23)
CHLORIDE: 105 mmol/L (ref 98–111)
CO2: 25 mmol/L (ref 22–32)
Calcium: 8.8 mg/dL — ABNORMAL LOW (ref 8.9–10.3)
Creatinine, Ser: 0.78 mg/dL (ref 0.44–1.00)
GFR calc Af Amer: >60 mL/min (ref >60)
Glucose, Bld: 133 mg/dL — ABNORMAL HIGH (ref 70–99)
POTASSIUM: 3.1 mmol/L — AB (ref 3.5–5.1)
Sodium: 139 mmol/L (ref 135–145)
TOTAL PROTEIN: 7 g/dL (ref 6.5–8.1)
Total Bilirubin: 1.8 mg/dL — ABNORMAL HIGH (ref 0.3–1.2)

## 2018-04-19 LAB — C DIFFICILE QUICK SCREEN W PCR REFLEX
C DIFFICILE (CDIFF) TOXIN: NEGATIVE
C DIFFICLE (CDIFF) ANTIGEN: NEGATIVE
C Diff interpretation: NOT DETECTED

## 2018-04-19 MED ORDER — IOPAMIDOL (ISOVUE-300) INJECTION 61%
30.0000 mL | Freq: Once | INTRAVENOUS | Status: AC
  Administered 2018-04-19: 30 mL via ORAL

## 2018-04-19 MED ORDER — SODIUM CHLORIDE 0.9 % IV BOLUS
500.0000 mL | Freq: Once | INTRAVENOUS | Status: AC
  Administered 2018-04-19: 500 mL via INTRAVENOUS

## 2018-04-19 MED ORDER — IOHEXOL 300 MG/ML  SOLN
100.0000 mL | Freq: Once | INTRAMUSCULAR | Status: AC | PRN
  Administered 2018-04-19: 100 mL via INTRAVENOUS

## 2018-04-19 NOTE — H&P (Signed)
[place] [facility] [place] NAME: Ashley Tanner    MR#:  [id number]  DATE OF BIRTH:  09-21-1940  DATE OF ADMISSION:  04/19/2018  PRIMARY CARE PHYSICIAN: Dion Body, MD   REQUESTING/REFERRING PHYSICIAN: Jacqualine Code, MD  CHIEF COMPLAINT:   Chief Complaint  Patient presents with  . Diarrhea  . Rectal Bleeding    HISTORY OF PRESENT ILLNESS:  Ashley Tanner  is a 78 y.o. female who presents with chief complaint as above.  Patient presents to the ED with a complaint of extremely frequent episodes of diarrhea for the last 24-36 hours.  She states that she typically has a history of problems with constipation, and she takes daily medication such as stool softener for this.  She denies any abdominal pain, nausea or vomiting, fever, or other symptoms.  Here in the ED she has had several episodes of liquidy diarrhea.  C. difficile and GI panel were negative.  CT imaging shows stool ball in the distal colon with surrounding inflammation concerning for possible stercoral proctocolitis.  GI was contacted by ED physician and recommends patient be admitted for likely colonoscopy tomorrow.  Hospitalist called for the same  PAST MEDICAL HISTORY:   Past Medical History:  Diagnosis Date  . Arthritis   . B12 deficiency   . Chronic diarrhea   . Diabetes mellitus without complication ([place])   . GERD (gastroesophageal reflux disease)   . History of colon polyps   . History of hiatal hernia   . History of supraventricular tachycardia   . Hoarseness of voice   . Hypertension   . Kidney stone   . Osteoarthritis   . Other constipation   . Peripheral neuropathy   . Psoriasis      PAST SURGICAL HISTORY:   Past Surgical History:  Procedure Laterality Date  . ABDOMINAL HYSTERECTOMY    . BREAST BIOPSY Left 01/29/2003   Fibrocystic changes, ductal adenosis, microcalcifications  . BREAST BIOPSY Left 03/29/2017   Affirm Bx- SUPERIOR, CENTRAL, POSTERIOR;  STEREOTACTIC BIOPSY: ORGANIZING FAT NECROSIS  . BREAST SURGERY Left November 2004   Fibrocystic changes, ductal adenosis, microcalcifications  . COLONOSCOPY  2013   Dr Vira Agar  . COLONOSCOPY WITH PROPOFOL N/A 10/29/2017   Procedure: COLONOSCOPY WITH PROPOFOL;  Surgeon: Manya Silvas, MD;  Location: [facility] ENDOSCOPY;  Service: Endoscopy;  Laterality: N/A;  . ESOPHAGOGASTRODUODENOSCOPY    . EYE SURGERY     Cataract surgery  . FLEXIBLE SIGMOIDOSCOPY    . HERNIA REPAIR  March 2011   [facility], intrathoracic hiatal hernia  . KNEE ARTHROSCOPY Left 2014     SOCIAL HISTORY:   Social History   Tobacco Use  . Smoking status: Never Smoker  . Smokeless tobacco: Never Used  Substance Use Topics  . Alcohol use: No     FAMILY HISTORY:   Family History  Problem Relation Age of Onset  . Prostate cancer Neg Hx   . Bladder Cancer Neg Hx   . Kidney cancer Neg Hx      DRUG ALLERGIES:   Allergies  Allergen Reactions  . Codeine Nausea Only    Nausea and dizziness  . Lisinopril Other (See Comments)    Weakness and dizziness  . Morphine And Related Nausea And Vomiting  . Zoloft [Sertraline Hcl] Other (See Comments)    Dizziness    MEDICATIONS AT HOME:   Prior to Admission medications   Medication Sig Start Date End Date Taking? Authorizing Provider  calcium-vitamin D Darron Doom  WITH D) 500-200 MG-UNIT per tablet Take 1 tablet by mouth daily.   Yes [provider]  docusate sodium (COLACE) 100 MG capsule Take 100 mg by mouth 2 (two) times daily.   Yes [provider]  hydrochlorothiazide (HYDRODIURIL) 12.5 MG tablet Take 12.5 mg by mouth daily.   Yes [provider]  pantoprazole (PROTONIX) 40 MG tablet Take 40 mg by mouth daily. 04/11/18  Yes [provider]  vitamin B-12 (CYANOCOBALAMIN) 1000 MCG tablet Take 1,000 mcg by mouth daily.   Yes [provider]    REVIEW OF SYSTEMS:  Review of Systems  Constitutional: Negative for chills, fever,  malaise/fatigue and weight loss.  HENT: Negative for ear pain, hearing loss and tinnitus.   Eyes: Negative for blurred vision, double vision, pain and redness.  Respiratory: Negative for cough, hemoptysis and shortness of breath.   Cardiovascular: Negative for chest pain, palpitations, orthopnea and leg swelling.  Gastrointestinal: Positive for diarrhea. Negative for abdominal pain, constipation, nausea and vomiting.  Genitourinary: Negative for dysuria, frequency and hematuria.  Musculoskeletal: Negative for back pain, joint pain and neck pain.  Skin:       No acne, rash, or lesions  Neurological: Negative for dizziness, tremors, focal weakness and weakness.  Endo/Heme/Allergies: Negative for polydipsia. Does not bruise/bleed easily.  Psychiatric/Behavioral: Negative for depression. The patient is not nervous/anxious and does not have insomnia.      VITAL SIGNS:   Vitals:   04/19/18 2120 04/19/18 2123 04/19/18 2200  BP: 132/71  (!) 141/92  Pulse: 80  75  Resp: 18  19  Temp: 98.7 F (37.1 C)    TempSrc: Oral    SpO2: 100%  99%  Weight:  64.4 kg   Height:  5\' 3"  (1.6 m)    Wt Readings from Last 3 Encounters:  04/19/18 64.4 kg  02/03/18 65.6 kg  10/29/17 66.2 kg    PHYSICAL EXAMINATION:  Physical Exam  Vitals reviewed. Constitutional: She is oriented to person, place, and time. She appears well-developed and well-nourished. No distress.  HENT:  Head: Normocephalic and atraumatic.  Mouth/Throat: Oropharynx is clear and moist.  Eyes: Pupils are equal, round, and reactive to light. Conjunctivae and EOM are normal. No scleral icterus.  Neck: Normal range of motion. Neck supple. No JVD present. No thyromegaly present.  Cardiovascular: Normal rate, regular rhythm and intact distal pulses. Exam reveals no gallop and no friction rub.  No murmur heard. Respiratory: Effort normal and breath sounds normal. No respiratory distress. She has no wheezes. She has no rales.  GI: Soft.  Bowel sounds are normal. She exhibits no distension. There is no abdominal tenderness.  Musculoskeletal: Normal range of motion.        General: No edema.     Comments: No arthritis, no gout  Lymphadenopathy:    She has no cervical adenopathy.  Neurological: She is alert and oriented to person, place, and time. No cranial nerve deficit.  No dysarthria, no aphasia  Skin: Skin is warm and dry. No rash noted. No erythema.  Psychiatric: She has a normal mood and affect. Her behavior is normal. Judgment and thought content normal.    LABORATORY PANEL:   CBC Recent Labs  Lab 04/19/18 2216  WBC 12.8*  HGB 13.4  HCT 38.6  PLT 274   ------------------------------------------------------------------------------------------------------------------  Chemistries  Recent Labs  Lab 04/19/18 2216  NA 139  K 3.1*  CL 105  CO2 25  GLUCOSE 133*  BUN 16  CREATININE 0.78  CALCIUM 8.8*  AST 15  ALT 10  ALKPHOS 92  BILITOT 1.8*   ------------------------------------------------------------------------------------------------------------------  Cardiac Enzymes No results for input(s): TROPONINI in the last 168 hours. ------------------------------------------------------------------------------------------------------------------  RADIOLOGY:  Ct Abdomen Pelvis W Contrast  Result Date: 04/19/2018 CLINICAL DATA:  Rectal bleeding and diarrhea x2 days. EXAM: CT ABDOMEN AND PELVIS WITH CONTRAST TECHNIQUE: Multidetector CT imaging of the abdomen and pelvis was performed using the standard protocol following bolus administration of intravenous contrast. CONTRAST:  156mL OMNIPAQUE IOHEXOL 300 MG/ML  SOLN COMPARISON:  07/08/2017 FINDINGS: Lower chest: Large sliding hiatal hernia. Normal heart size without pericardial effusion to the extent included. Lung bases are clear. Hepatobiliary: No focal liver abnormality is seen. No gallstones, gallbladder wall thickening, or biliary dilatation. Pancreas:  Atrophic pancreas without mass or ductal dilatation. Spleen: Normal size spleen without focal mass. Adrenals/Urinary Tract: Normal bilateral adrenal glands. 14 x 8 x 10 mm nonobstructing right upper pole renal calculus. Mild cortical thinning of both kidneys. No enhancing mass. No hydroureteronephrosis. The urinary bladder is physiologically distended without focal mural thickening or calculus. Stomach/Bowel: Sliding hiatal hernia as described. Duodenal diverticulum off the third portion. Normal duodenal sweep otherwise. Ligament of Treitz in small bowel loops are unremarkable. Moderate stool retention throughout the colon with scattered colonic diverticulosis along the sigmoid. Mild transmural thickening of the rectum and distal sigmoid surrounding a moderate-sized stool ball raising concern for stercoral proctocolitis. Vascular/Lymphatic: Aortic atherosclerosis. No enlarged abdominal or pelvic lymph nodes. Reproductive: Status post hysterectomy. No adnexal masses. Other: No abdominal wall hernia or abnormality. No abdominopelvic ascites. Musculoskeletal: T12-L1 degenerative disc disease with posterior marginal osteophytes at L2 and L3 in particular. Facet arthropathy without pars defects or listhesis. Levoconvex curvature of the lumbar spine with apex at L2. IMPRESSION: 1. Mild transmural thickening of the rectum and distal sigmoid surrounding a moderate-sized stool ball raises concern for stercoral proctocolitis. Increased stool retention throughout the colon is otherwise noted. 2. 14 x 8 x 10 mm nonobstructing right upper pole renal calculus. 3. Large sliding hiatal hernia. 4. Duodenal diverticulum off the third portion. Electronically Signed   By: Ashley Royalty M.D.   On: 04/19/2018 23:09    EKG:   Orders placed or performed in visit on 05/05/12  . EKG 12-Lead    IMPRESSION AND PLAN:  Principal Problem:   Proctocolitis -infectious work-up is negative, will provide supportive treatment tonight and get a  GI consult for likely scope tomorrow. Active Problems:   Diabetes ([place]) -patient is not on medication for this at home, we will monitor glucose levels and treat only as needed   HTN (hypertension) -continue home dose antihypertensives   GERD (gastroesophageal reflux disease) -continue home dose PPI  Chart review performed and case discussed with ED provider. Labs, imaging and/or ECG reviewed by provider and discussed with patient/family. Management plans discussed with the patient and/or family.  DVT PROPHYLAXIS: SubQ lovenox   GI PROPHYLAXIS:  PPI   ADMISSION STATUS: Inpatient     CODE STATUS: Full  TOTAL TIME TAKING CARE OF THIS PATIENT: 45 minutes.   Ethlyn Daniels 04/19/2018, 11:56 PM  [facility]  Office  [phone]  CC: Primary care physician; Dion Body, MD  Note:  This document was prepared using Dragon voice recognition software and may include unintentional dictation errors.

## 2018-04-19 NOTE — ED Triage Notes (Signed)
Rectal bleeding and diarrhea x 2 days.

## 2018-04-19 NOTE — ED Notes (Signed)
Patient completes oral contrast. Assisted to commode multiple times. Patient had small amouts of yellowish foul smelling stools

## 2018-04-19 NOTE — ED Provider Notes (Signed)
[facility] Emergency Department Provider Note   ____________________________________________   First MD Initiated Contact with Patient 04/19/18 2129     (approximate)  I have reviewed the triage vital signs and the nursing notes.   HISTORY  Chief Complaint Diarrhea and Rectal Bleeding    HPI Ashley Tanner is a 78 y.o. female presents for evaluation of loose stools.  Patient reports she is had about 30-40 loose stools having to wear depends today and started yesterday.  She feels slightly fatigued, a little bit of discomfort in her lower abdomen, says lots of discomfort around her rectum where she feels like she has a lot of irritation and maybe is developed a hemorrhoid as there is pain in the region.  No nausea vomiting.  No fever.  Denies any sick contacts  Reports 30-40 very loose yellowish stools.  Unable to control bowels due to the severity of the looseness.  Saw couple flecks of blood in the stool earlier but really denies a black or bloody.   As she has seen Dr. Vira Agar in the past  Past Medical History:  Diagnosis Date  . Arthritis   . B12 deficiency   . Chronic diarrhea   . Diabetes mellitus without complication ([place])   . GERD (gastroesophageal reflux disease)   . History of colon polyps   . History of hiatal hernia   . History of supraventricular tachycardia   . Hoarseness of voice   . Hypertension   . Kidney stone   . Osteoarthritis   . Other constipation   . Peripheral neuropathy   . Psoriasis     Patient Active Problem List   Diagnosis Date Noted  . Diabetes ([place]) 04/19/2018  . HTN (hypertension) 04/19/2018  . GERD (gastroesophageal reflux disease) 04/19/2018  . Proctocolitis 04/19/2018  . Breast mass   . Abnormal mammogram 08/10/2012    Past Surgical History:  Procedure Laterality Date  . ABDOMINAL HYSTERECTOMY    . BREAST BIOPSY Left 01/29/2003   Fibrocystic changes, ductal adenosis, microcalcifications  . BREAST  BIOPSY Left 03/29/2017   Affirm Bx- SUPERIOR, CENTRAL, POSTERIOR; STEREOTACTIC BIOPSY: ORGANIZING FAT NECROSIS  . BREAST SURGERY Left November 2004   Fibrocystic changes, ductal adenosis, microcalcifications  . COLONOSCOPY  2013   Dr Vira Agar  . COLONOSCOPY WITH PROPOFOL N/A 10/29/2017   Procedure: COLONOSCOPY WITH PROPOFOL;  Surgeon: Manya Silvas, MD;  Location: [facility] ENDOSCOPY;  Service: Endoscopy;  Laterality: N/A;  . ESOPHAGOGASTRODUODENOSCOPY    . EYE SURGERY     Cataract surgery  . FLEXIBLE SIGMOIDOSCOPY    . HERNIA REPAIR  March 2011   [facility], intrathoracic hiatal hernia  . KNEE ARTHROSCOPY Left 2014    Prior to Admission medications   Medication Sig Start Date End Date Taking? Authorizing Provider  calcium-vitamin D (OSCAL WITH D) 500-200 MG-UNIT per tablet Take 1 tablet by mouth daily.   Yes [provider]  docusate sodium (COLACE) 100 MG capsule Take 100 mg by mouth 2 (two) times daily.   Yes [provider]  hydrochlorothiazide (HYDRODIURIL) 12.5 MG tablet Take 12.5 mg by mouth daily.   Yes [provider]  pantoprazole (PROTONIX) 40 MG tablet Take 40 mg by mouth daily. 04/11/18  Yes [provider]  vitamin B-12 (CYANOCOBALAMIN) 1000 MCG tablet Take 1,000 mcg by mouth daily.   Yes [provider]    Allergies Codeine; Lisinopril; Morphine and related; and Zoloft [sertraline hcl]  Family History  Problem Relation Age of Onset  .  Prostate cancer Neg Hx   . Bladder Cancer Neg Hx   . Kidney cancer Neg Hx     Social History Social History   Tobacco Use  . Smoking status: Never Smoker  . Smokeless tobacco: Never Used  Substance Use Topics  . Alcohol use: No  . Drug use: No    Review of Systems Constitutional: No fever/chills Eyes: No visual changes. ENT: No sore throat. Cardiovascular: Denies chest pain. Respiratory: Denies shortness of breath. Gastrointestinal: See HPI genitourinary: Negative for  dysuria. Musculoskeletal: Negative for back pain. Skin: Negative for rash. Neurological: Negative for headaches, areas of focal weakness or numbness.    ____________________________________________   PHYSICAL EXAM:  VITAL SIGNS: ED Triage Vitals  Enc Vitals Group     BP 04/19/18 2120 132/71     Pulse Rate 04/19/18 2120 80     Resp 04/19/18 2120 18     Temp 04/19/18 2120 98.7 F (37.1 C)     Temp Source 04/19/18 2120 Oral     SpO2 04/19/18 2120 100 %     Weight 04/19/18 2123 142 lb (64.4 kg)     Height 04/19/18 2123 5\' 3"  (1.6 m)     Head Circumference --      Peak Flow --      Pain Score 04/19/18 2120 4     Pain Loc --      Pain Edu? --      Excl. in [place]? --     Constitutional: Alert and oriented. Well appearing and in no acute distress.  She and her niece are both very pleasant. Eyes: Conjunctivae are normal. Head: Atraumatic. Nose: No congestion/rhinnorhea. Mouth/Throat: Mucous membranes are slightly dry. Neck: No stridor.  Cardiovascular: Normal rate, regular rhythm. Grossly normal heart sounds.  Good peripheral circulation. Respiratory: Normal respiratory effort.  No retractions. Lungs CTAB. Gastrointestinal: Soft and nontender except for some focal tenderness in the right flank and right lower quadrant with no rebound or guarding. No distention. Rectal examination: Performed with nurse Jeanett Schlein.  The perineum is slightly erythematous in the perirectal region likely from skin irritation.  There is no external hemorrhoid.  There is some tenderness to digital rectal exam.  There is obvious very loose yellow stool that tests heme negative with a positive control. Musculoskeletal: No lower extremity tenderness nor edema. Neurologic:  Normal speech and language. No gross focal neurologic deficits are appreciated.  Skin:  Skin is warm, dry and intact. No rash noted. Psychiatric: Mood and affect are normal. Speech and behavior are  normal.  ____________________________________________   LABS (all labs ordered are listed, but only abnormal results are displayed)  Labs Reviewed  CBC - Abnormal; Notable for the following components:      Result Value   WBC 12.8 (*)    All other components within normal limits  COMPREHENSIVE METABOLIC PANEL - Abnormal; Notable for the following components:   Potassium 3.1 (*)    Glucose, Bld 133 (*)    Calcium 8.8 (*)    Total Bilirubin 1.8 (*)    All other components within normal limits  BASIC METABOLIC PANEL - Abnormal; Notable for the following components:   Glucose, Bld 108 (*)    Calcium 8.5 (*)    All other components within normal limits  CBC - Abnormal; Notable for the following components:   WBC 10.8 (*)    RBC 3.80 (*)    HCT 35.1 (*)    All other components within normal limits  GLUCOSE, CAPILLARY -  Abnormal; Notable for the following components:   Glucose-Capillary 112 (*)    All other components within normal limits  GLUCOSE, CAPILLARY - Abnormal; Notable for the following components:   Glucose-Capillary 138 (*)    All other components within normal limits  GASTROINTESTINAL PANEL BY PCR, STOOL (REPLACES STOOL CULTURE)  C DIFFICILE QUICK SCREEN W PCR REFLEX  LIPASE, BLOOD   ____________________________________________  EKG   ____________________________________________  RADIOLOGY  Ct Abdomen Pelvis W Contrast  Result Date: 04/19/2018 CLINICAL DATA:  Rectal bleeding and diarrhea x2 days. EXAM: CT ABDOMEN AND PELVIS WITH CONTRAST TECHNIQUE: Multidetector CT imaging of the abdomen and pelvis was performed using the standard protocol following bolus administration of intravenous contrast. CONTRAST:  129mL OMNIPAQUE IOHEXOL 300 MG/ML  SOLN COMPARISON:  07/08/2017 FINDINGS: Lower chest: Large sliding hiatal hernia. Normal heart size without pericardial effusion to the extent included. Lung bases are clear. Hepatobiliary: No focal liver abnormality is seen.  No gallstones, gallbladder wall thickening, or biliary dilatation. Pancreas: Atrophic pancreas without mass or ductal dilatation. Spleen: Normal size spleen without focal mass. Adrenals/Urinary Tract: Normal bilateral adrenal glands. 14 x 8 x 10 mm nonobstructing right upper pole renal calculus. Mild cortical thinning of both kidneys. No enhancing mass. No hydroureteronephrosis. The urinary bladder is physiologically distended without focal mural thickening or calculus. Stomach/Bowel: Sliding hiatal hernia as described. Duodenal diverticulum off the third portion. Normal duodenal sweep otherwise. Ligament of Treitz in small bowel loops are unremarkable. Moderate stool retention throughout the colon with scattered colonic diverticulosis along the sigmoid. Mild transmural thickening of the rectum and distal sigmoid surrounding a moderate-sized stool ball raising concern for stercoral proctocolitis. Vascular/Lymphatic: Aortic atherosclerosis. No enlarged abdominal or pelvic lymph nodes. Reproductive: Status post hysterectomy. No adnexal masses. Other: No abdominal wall hernia or abnormality. No abdominopelvic ascites. Musculoskeletal: T12-L1 degenerative disc disease with posterior marginal osteophytes at L2 and L3 in particular. Facet arthropathy without pars defects or listhesis. Levoconvex curvature of the lumbar spine with apex at L2. IMPRESSION: 1. Mild transmural thickening of the rectum and distal sigmoid surrounding a moderate-sized stool ball raises concern for stercoral proctocolitis. Increased stool retention throughout the colon is otherwise noted. 2. 14 x 8 x 10 mm nonobstructing right upper pole renal calculus. 3. Large sliding hiatal hernia. 4. Duodenal diverticulum off the third portion. Electronically Signed   By: Ashley Royalty M.D.   On: 04/19/2018 23:09    CT scan reviewed by me and discussed with Dr. Vicente Males ____________________________________________   PROCEDURES  Procedure(s) performed:  None  Procedures  Critical Care performed: No  ____________________________________________   INITIAL IMPRESSION / ASSESSMENT AND PLAN / ED COURSE  Pertinent labs & imaging results that were available during my care of the patient were reviewed by me and considered in my medical decision making (see chart for details).  Differential diagnosis includes but is not limited to, abdominal perforation, aortic dissection, cholecystitis, appendicitis, diverticulitis, colitis, esophagitis/gastritis, kidney stone, pyelonephritis, urinary tract infection, aortic aneurysm. All are considered in decision and treatment plan. Based upon the patient's presentation and risk factors, symptoms appear to be primarily colonic in nature.  We will proceed with CT scan given discomfort and symptomatology.    Vision of the resents for evaluation of rectal pain.  Examination does not demonstrate an obvious reachable impaction, CT scan though does demonstrate proctitis and findings as noted on CT scan.  Discussed with gastroenterology, advises consultation and admission, further treatment based upon stool culture data.  Patient agreeable with plan for admission.  Supportive care at this time.  Dissipate admission for GI consult and further work-up.  Clinical Course as of Apr 20 2021  Tue Apr 19, 2018  2326 Dr. Vicente Males advises admit, GI consult and consider colonoscopy, and await stool studies. Nothing to do for acute intervention aside from supportive care as stool culture pends. GI will see in consult.    [MQ]    Clinical Course User Index [MQ] Delman Kitten, MD     ____________________________________________   FINAL CLINICAL IMPRESSION(S) / ED DIAGNOSES  Final diagnoses:  Proctitis  Fecal impaction ([place])        Note:  This document was prepared using Dragon voice recognition software and may include unintentional dictation errors       Delman Kitten, MD 04/20/18 2023

## 2018-04-20 LAB — CBC
HCT: 35.1 % — ABNORMAL LOW (ref 36.0–46.0)
Hemoglobin: 12 g/dL (ref 12.0–15.0)
MCH: 31.6 pg (ref 26.0–34.0)
MCHC: 34.2 g/dL (ref 30.0–36.0)
MCV: 92.4 fL (ref 80.0–100.0)
PLATELETS: 248 10*3/uL (ref 150–400)
RBC: 3.8 MIL/uL — AB (ref 3.87–5.11)
RDW: 12.3 % (ref 11.5–15.5)
WBC: 10.8 10*3/uL — ABNORMAL HIGH (ref 4.0–10.5)
nRBC: 0 % (ref 0.0–0.2)

## 2018-04-20 LAB — BASIC METABOLIC PANEL
Anion gap: 6 (ref 5–15)
BUN: 13 mg/dL (ref 8–23)
CO2: 26 mmol/L (ref 22–32)
Calcium: 8.5 mg/dL — ABNORMAL LOW (ref 8.9–10.3)
Chloride: 108 mmol/L (ref 98–111)
Creatinine, Ser: 0.7 mg/dL (ref 0.44–1.00)
GFR calc Af Amer: >60 mL/min (ref >60)
GFR calc non Af Amer: >60 mL/min (ref >60)
Glucose, Bld: 108 mg/dL — ABNORMAL HIGH (ref 70–99)
Potassium: 3.7 mmol/L (ref 3.5–5.1)
Sodium: 140 mmol/L (ref 135–145)

## 2018-04-20 LAB — GASTROINTESTINAL PANEL BY PCR, STOOL (REPLACES STOOL CULTURE)

## 2018-04-20 LAB — GLUCOSE, CAPILLARY
GLUCOSE-CAPILLARY: 112 mg/dL — AB (ref 70–99)
Glucose-Capillary: 138 mg/dL — ABNORMAL HIGH (ref 70–99)
Glucose-Capillary: 121 mg/dL — ABNORMAL HIGH (ref 70–99)

## 2018-04-20 MED ORDER — ACETAMINOPHEN 325 MG PO TABS
650.0000 mg | ORAL_TABLET | Freq: Four times a day (QID) | ORAL | Status: DC | PRN
  Administered 2018-04-20: 650 mg via ORAL
  Filled 2018-04-20 (×2): qty 2

## 2018-04-20 MED ORDER — INSULIN ASPART 100 UNIT/ML ~~LOC~~ SOLN
0.0000 [IU] | Freq: Three times a day (TID) | SUBCUTANEOUS | Status: DC
Start: 2018-04-20 — End: 2018-04-21

## 2018-04-20 MED ORDER — ACETAMINOPHEN 650 MG RE SUPP: 650.0000 mg | Freq: Four times a day (QID) | RECTAL | Status: DC | PRN

## 2018-04-20 MED ORDER — MORPHINE SULFATE (PF) 2 MG/ML IV SOLN
1.0000 mg | INTRAVENOUS | Status: DC | PRN
  Administered 2018-04-20 (×2): 1 mg via INTRAVENOUS
  Filled 2018-04-20 (×2): qty 1

## 2018-04-20 MED ORDER — PANTOPRAZOLE SODIUM 40 MG PO TBEC
40.0000 mg | DELAYED_RELEASE_TABLET | Freq: Every day | ORAL | Status: DC
  Administered 2018-04-20 – 2018-04-21 (×2): 40 mg via ORAL
  Filled 2018-04-20 (×2): qty 1

## 2018-04-20 MED ORDER — SENNA 8.6 MG PO TABS
1.0000 | ORAL_TABLET | Freq: Every day | ORAL | Status: DC
  Administered 2018-04-20 – 2018-04-21 (×2): 8.6 mg via ORAL
  Filled 2018-04-20 (×2): qty 1

## 2018-04-20 MED ORDER — DOCUSATE SODIUM 100 MG PO CAPS
100.0000 mg | ORAL_CAPSULE | Freq: Two times a day (BID) | ORAL | Status: DC
  Administered 2018-04-20 – 2018-04-21 (×3): 100 mg via ORAL
  Filled 2018-04-20 (×3): qty 1

## 2018-04-20 MED ORDER — MINERAL OIL RE ENEM: 1.0000 | ENEMA | Freq: Once | RECTAL | Status: DC

## 2018-04-20 MED ORDER — TRAMADOL HCL 50 MG PO TABS
50.0000 mg | ORAL_TABLET | Freq: Four times a day (QID) | ORAL | Status: DC | PRN
  Administered 2018-04-20 (×2): 50 mg via ORAL
  Filled 2018-04-20 (×3): qty 1

## 2018-04-20 MED ORDER — ONDANSETRON HCL 4 MG PO TABS: 4.0000 mg | ORAL_TABLET | Freq: Four times a day (QID) | ORAL | Status: DC | PRN

## 2018-04-20 MED ORDER — SODIUM CHLORIDE 0.9 % IV SOLN
INTRAVENOUS | Status: AC
  Administered 2018-04-20: 01:00:00 via INTRAVENOUS

## 2018-04-20 MED ORDER — HYDROCORTISONE 2.5 % RE CREA
TOPICAL_CREAM | Freq: Four times a day (QID) | RECTAL | Status: DC | PRN
  Filled 2018-04-20: qty 28.35

## 2018-04-20 MED ORDER — POLYETHYLENE GLYCOL 3350 17 G PO PACK
17.0000 g | PACK | Freq: Every day | ORAL | Status: DC
  Administered 2018-04-20 – 2018-04-21 (×2): 17 g via ORAL
  Filled 2018-04-20 (×2): qty 1

## 2018-04-20 MED ORDER — ENOXAPARIN SODIUM 40 MG/0.4ML ~~LOC~~ SOLN
40.0000 mg | SUBCUTANEOUS | Status: DC
  Administered 2018-04-20: 40 mg via SUBCUTANEOUS
  Filled 2018-04-20: qty 0.4

## 2018-04-20 MED ORDER — ONDANSETRON HCL 4 MG/2ML IJ SOLN: 4.0000 mg | Freq: Four times a day (QID) | INTRAMUSCULAR | Status: DC | PRN

## 2018-04-20 MED ORDER — HYDROCHLOROTHIAZIDE 25 MG PO TABS
12.5000 mg | ORAL_TABLET | Freq: Every day | ORAL | Status: DC
  Administered 2018-04-20 – 2018-04-21 (×2): 12.5 mg via ORAL
  Filled 2018-04-20 (×2): qty 1

## 2018-04-20 NOTE — Consult Note (Signed)
GI Inpatient Consult Note  Reason for Consult: Stercoral proctocolitis   Attending Requesting Consult: Dr. Benjie Karvonen  History of Present Illness: Ashley Tanner is a 78 y.o. female seen for evaluation of rectal pain, stercoral proctocolitis at the request of Dr. Benjie Karvonen. Pt presented to the ED last night for complaints of rectal pain and fecal incontinence over the past 2 days. CT abdomen performed showed mild transmural thickening of the rectum and distal sigmoid surrounding a moderate-sized stool ball, raising the concern for stercoral proctocolitis.   Pt seen and examined today resting in hospital bed. Her niece is present in room. Pt reports a hx of chronic constipation, for which she has been taking stool softener (Colace) twice daily. She has been taking miralax only over the past week as needed. Over the past 3 days, she reports issues with very frequent episodes of incontinence of passing mushy, liquidy pale stool. She reports rectal pain 10/10 today. She denies any abdominal pain or lower abdominal cramping. The only recent medication change has been restarting Protonix 20 mg daily for GERD. C difficile and GI panel were collected and negative. She reports a known hx of hemorrhoids and seeing a tiny bit of fresh red blood on toilet paper after wiping. She reports over the past 3 days she has gone through 35+ adult diapers. She has had several light brown liquid stools today.   Pt is established with [facility]. She was last seen by Dawson Bills, NP a week and a half ago. Last colonoscopy 10/2017 for colonoscopy for personal history of colon polyps notable for 3 diminutive polyps and internal hemorrhoids. Pathology returned tubular adenoma x2. Repeat colonoscopy 10/2022.    Last Colonoscopy: 10/2017 Last Endoscopy: N/A   Past Medical History:  Past Medical History:  Diagnosis Date  . Arthritis   . B12 deficiency   . Chronic diarrhea   . Diabetes mellitus without complication ([place])   . GERD  (gastroesophageal reflux disease)   . History of colon polyps   . History of hiatal hernia   . History of supraventricular tachycardia   . Hoarseness of voice   . Hypertension   . Kidney stone   . Osteoarthritis   . Other constipation   . Peripheral neuropathy   . Psoriasis     Problem List: Patient Active Problem List   Diagnosis Date Noted  . Diabetes ([place]) 04/19/2018  . HTN (hypertension) 04/19/2018  . GERD (gastroesophageal reflux disease) 04/19/2018  . Proctocolitis 04/19/2018  . Breast mass   . Abnormal mammogram 08/10/2012    Past Surgical History: Past Surgical History:  Procedure Laterality Date  . ABDOMINAL HYSTERECTOMY    . BREAST BIOPSY Left 01/29/2003   Fibrocystic changes, ductal adenosis, microcalcifications  . BREAST BIOPSY Left 03/29/2017   Affirm Bx- SUPERIOR, CENTRAL, POSTERIOR; STEREOTACTIC BIOPSY: ORGANIZING FAT NECROSIS  . BREAST SURGERY Left November 2004   Fibrocystic changes, ductal adenosis, microcalcifications  . COLONOSCOPY  2013   Dr Vira Agar  . COLONOSCOPY WITH PROPOFOL N/A 10/29/2017   Procedure: COLONOSCOPY WITH PROPOFOL;  Surgeon: Manya Silvas, MD;  Location: [facility] ENDOSCOPY;  Service: Endoscopy;  Laterality: N/A;  . ESOPHAGOGASTRODUODENOSCOPY    . EYE SURGERY     Cataract surgery  . FLEXIBLE SIGMOIDOSCOPY    . HERNIA REPAIR  March 2011   [facility], intrathoracic hiatal hernia  . KNEE ARTHROSCOPY Left 2014    Allergies: Allergies  Allergen Reactions  . Codeine Nausea Only    Nausea and dizziness  . Lisinopril  Other (See Comments)    Weakness and dizziness  . Morphine And Related Nausea And Vomiting  . Zoloft [Sertraline Hcl] Other (See Comments)    Dizziness    Home Medications: Medications Prior to Admission  Medication Sig Dispense Refill Last Dose  . calcium-vitamin D (OSCAL WITH D) 500-200 MG-UNIT per tablet Take 1 tablet by mouth daily.   unknown at unknown  . docusate sodium (COLACE) 100 MG capsule Take 100 mg by mouth 2  (two) times daily.   unknown at unknown  . hydrochlorothiazide (HYDRODIURIL) 12.5 MG tablet Take 12.5 mg by mouth daily.   unknown at unknown  . pantoprazole (PROTONIX) 40 MG tablet Take 40 mg by mouth daily.   unknown at unknown  . vitamin B-12 (CYANOCOBALAMIN) 1000 MCG tablet Take 1,000 mcg by mouth daily.   unknown at unknown   Home medication reconciliation was completed with the patient.   Scheduled Inpatient Medications:   . docusate sodium  100 mg Oral BID  . enoxaparin (LOVENOX) injection  40 mg Subcutaneous Q24H  . hydrochlorothiazide  12.5 mg Oral Daily  . insulin aspart  0-9 Units Subcutaneous TID WC  . pantoprazole  40 mg Oral Daily  . polyethylene glycol  17 g Oral Daily  . senna  1 tablet Oral Daily    Continuous Inpatient Infusions:    PRN Inpatient Medications:  acetaminophen **OR** acetaminophen, ondansetron **OR** ondansetron (ZOFRAN) IV, traMADol  Family History: family history is not on file.  The patient's family history is negative for inflammatory bowel disorders, GI malignancy, or solid organ transplantation.  Social History:   reports that she has never smoked. She has never used smokeless tobacco. She reports that she does not drink alcohol or use drugs. The patient denies ETOH, tobacco, or drug use.   Review of Systems: Constitutional: Weight is stable.  Eyes: No changes in vision. ENT: No oral lesions, sore throat.  GI: see HPI.  Heme/Lymph: No easy bruising.  CV: No chest pain.  GU: No hematuria.  Integumentary: No rashes.  Neuro: No headaches.  Psych: No depression/anxiety.  Endocrine: No heat/cold intolerance.  Allergic/Immunologic: No urticaria.  Resp: No cough, SOB.  Musculoskeletal: No joint swelling.    Physical Examination: BP 118/74   Pulse 71   Temp 98.2 F (36.8 C) (Oral)   Resp 18   Ht 5\' 3"  (1.6 m)   Wt 64.4 kg   SpO2 96%   BMI 25.15 kg/m  Gen: NAD, alert and oriented x 4 HEENT: PEERLA, EOMI, Neck: supple, no JVD or  thyromegaly Chest: CTA bilaterally, no wheezes, crackles, or other adventitious sounds CV: RRR, no m/g/c/r Abd: soft, NT, ND, +BS in all four quadrants; no HSM, guarding, ridigity, or rebound tenderness Rectal: external exam showed no obvious masses or lesions. Liquid stool surrounding perianal region. Internal exam with small non-bleeding internal hemorrhoids. No obvious fissure. Stool ball palpated and was broken into small pieces with right index finger in attempt to remove stool. Ext: no edema, well perfused with 2+ pulses, Skin: no rash or lesions noted Lymph: no LAD  Data: Lab Results  Component Value Date   WBC 10.8 (H) 04/20/2018   HGB 12.0 04/20/2018   HCT 35.1 (L) 04/20/2018   MCV 92.4 04/20/2018   PLT 248 04/20/2018   Recent Labs  Lab 04/19/18 2216 04/20/18 0326  HGB 13.4 12.0   Lab Results  Component Value Date   NA 140 04/20/2018   K 3.7 04/20/2018   CL 108 04/20/2018  CO2 26 04/20/2018   BUN 13 04/20/2018   CREATININE 0.70 04/20/2018   Lab Results  Component Value Date   ALT 10 04/19/2018   AST 15 04/19/2018   ALKPHOS 92 04/19/2018   BILITOT 1.8 (H) 04/19/2018   No results for input(s): APTT, INR, PTT in the last 168 hours. Assessment/Plan:  78 y/o Caucasian female with a PMH of Vit B12 deficiency, hx of chronic constipation, Type II DM, GERD, Hx of colon polyps, HTN, psoriasis admitted for rectal pain  1. Stercoral proctocolitis: - CT yesterday showing mild transmural thickening of the rectum and distal sigmoid surrounding a moderate-sized stool ball  - I evaluated patient today in room with niece and nurse, Terri, present. I performed manual disimpaction and attempted to break up stool ball. Appx 1/2-1 cup of pale brown stool was removed with my right index finger.  -1 fleet enema given while in room returned 2 BMs. We will give another fleet enema this afternoon and one mineral oil enema tonight - Continue to monitor patient's symptoms. We discussed  importance of good bowel regimen to help prevent constipation. Advised her to start 2 capfuls of miralax daily when she goes home. She may stop the stool softeners as they are not indicated anymore - No need for colonoscopy at this point given her recent colonoscopy 10/2017 - We will continue to follow. All of her questions were answered.   Recommendations:  1. 2 fleet enemas this afternoon. Mineral oil enema tonight 1900.  Thank you for the consult. Please call with questions or concerns.  Geanie Kenning, PA-C [facility]  [phone]

## 2018-04-20 NOTE — Progress Notes (Signed)
[place] [facility] [place] NAME: Zarya Lasseigne    MR#:  [id number]  DATE OF BIRTH:  September 01, 1940  SUBJECTIVE:   Patient presents with rectal pain  REVIEW OF SYSTEMS:    Review of Systems  Constitutional: Negative for fever, chills weight loss HENT: Negative for ear pain, nosebleeds, congestion, facial swelling, rhinorrhea, neck pain, neck stiffness and ear discharge.   Respiratory: Negative for cough, shortness of breath, wheezing  Cardiovascular: Negative for chest pain, palpitations and leg swelling.  Gastrointestinal: Negative for heartburn, abdominal pain, vomiting, diarrhea o++ rectal pain with consitpation Genitourinary: Negative for dysuria, urgency, frequency, hematuria Musculoskeletal: Negative for back pain or joint pain Neurological: Negative for dizziness, seizures, syncope, focal weakness,  numbness and headaches.  Hematological: Does not bruise/bleed easily.  Psychiatric/Behavioral: Negative for hallucinations, confusion, dysphoric mood    Tolerating Diet: npo      DRUG ALLERGIES:   Allergies  Allergen Reactions  . Codeine Nausea Only    Nausea and dizziness  . Lisinopril Other (See Comments)    Weakness and dizziness  . Morphine And Related Nausea And Vomiting  . Zoloft [Sertraline Hcl] Other (See Comments)    Dizziness    VITALS:  Blood pressure 116/68, pulse 70, temperature (!) 97.5 F (36.4 C), temperature source Oral, resp. rate 20, height 5\' 3"  (1.6 m), weight 64.4 kg, SpO2 99 %.  PHYSICAL EXAMINATION:  Constitutional: Appears well-developed and well-nourished. No distress. HENT: Normocephalic. Marland Kitchen Oropharynx is clear and moist.  Eyes: Conjunctivae and EOM are normal. PERRLA, no scleral icterus.  Neck: Normal ROM. Neck supple. No JVD. No tracheal deviation. CVS: RRR, S1/S2 +, no murmurs, no gallops, no carotid bruit.  Pulmonary: Effort and breath sounds normal, no stridor, rhonchi, wheezes, rales.  Abdominal:  Soft. BS +,  no distension, tenderness, rebound or guarding.  Musculoskeletal: Normal range of motion. No edema and no tenderness.  Neuro: Alert. CN 2-12 grossly intact. No focal deficits. Skin: Skin is warm and dry. No rash noted. Psychiatric: Normal mood and affect.      LABORATORY PANEL:   CBC Recent Labs  Lab 04/20/18 0326  WBC 10.8*  HGB 12.0  HCT 35.1*  PLT 248   ------------------------------------------------------------------------------------------------------------------  Chemistries  Recent Labs  Lab 04/19/18 2216 04/20/18 0326  NA 139 140  K 3.1* 3.7  CL 105 108  CO2 25 26  GLUCOSE 133* 108*  BUN 16 13  CREATININE 0.78 0.70  CALCIUM 8.8* 8.5*  AST 15  --   ALT 10  --   ALKPHOS 92  --   BILITOT 1.8*  --    ------------------------------------------------------------------------------------------------------------------  Cardiac Enzymes No results for input(s): TROPONINI in the last 168 hours. ------------------------------------------------------------------------------------------------------------------  RADIOLOGY:  Ct Abdomen Pelvis W Contrast  Result Date: 04/19/2018 CLINICAL DATA:  Rectal bleeding and diarrhea x2 days. EXAM: CT ABDOMEN AND PELVIS WITH CONTRAST TECHNIQUE: Multidetector CT imaging of the abdomen and pelvis was performed using the standard protocol following bolus administration of intravenous contrast. CONTRAST:  130mL OMNIPAQUE IOHEXOL 300 MG/ML  SOLN COMPARISON:  07/08/2017 FINDINGS: Lower chest: Large sliding hiatal hernia. Normal heart size without pericardial effusion to the extent included. Lung bases are clear. Hepatobiliary: No focal liver abnormality is seen. No gallstones, gallbladder wall thickening, or biliary dilatation. Pancreas: Atrophic pancreas without mass or ductal dilatation. Spleen: Normal size spleen without focal mass. Adrenals/Urinary Tract: Normal bilateral adrenal glands. 14 x 8 x 10 mm nonobstructing right  upper pole renal calculus. Mild cortical  thinning of both kidneys. No enhancing mass. No hydroureteronephrosis. The urinary bladder is physiologically distended without focal mural thickening or calculus. Stomach/Bowel: Sliding hiatal hernia as described. Duodenal diverticulum off the third portion. Normal duodenal sweep otherwise. Ligament of Treitz in small bowel loops are unremarkable. Moderate stool retention throughout the colon with scattered colonic diverticulosis along the sigmoid. Mild transmural thickening of the rectum and distal sigmoid surrounding a moderate-sized stool ball raising concern for stercoral proctocolitis. Vascular/Lymphatic: Aortic atherosclerosis. No enlarged abdominal or pelvic lymph nodes. Reproductive: Status post hysterectomy. No adnexal masses. Other: No abdominal wall hernia or abnormality. No abdominopelvic ascites. Musculoskeletal: T12-L1 degenerative disc disease with posterior marginal osteophytes at L2 and L3 in particular. Facet arthropathy without pars defects or listhesis. Levoconvex curvature of the lumbar spine with apex at L2. IMPRESSION: 1. Mild transmural thickening of the rectum and distal sigmoid surrounding a moderate-sized stool ball raises concern for stercoral proctocolitis. Increased stool retention throughout the colon is otherwise noted. 2. 14 x 8 x 10 mm nonobstructing right upper pole renal calculus. 3. Large sliding hiatal hernia. 4. Duodenal diverticulum off the third portion. Electronically Signed   By: Ashley Royalty M.D.   On: 04/19/2018 23:09     ASSESSMENT AND PLAN:   78 year old female with a history of hypertension and diet-controlled diabetes who presents with rectal pain.  1.  Proctocolitis from constipation: Plan for GI consultation and possible colonoscopy Start stool softeners  2.  Essential hypertension: Continue HCTZ  3.  Diet controlled diabetes on sliding scale  4.  Hypokalemia: This was repleted and has  improved.      Management plans discussed with the patient and she is in agreement.  CODE STATUS: full  TOTAL TIME TAKING CARE OF THIS PATIENT: 30 minutes.     POSSIBLE D/C tomorrow, DEPENDING ON CLINICAL CONDITION.   Ario Mcdiarmid M.D on 04/20/2018 at 11:09 AM  Between 7am to 6pm - Pager - [phone] After 6pm go to [email or web address] - password [email or web address] [facility]  Office  [phone]  CC: Primary care physician; Dion Body, MD  Note: This dictation was prepared with Dragon dictation along with smaller phrase technology. Any transcriptional errors that result from this process are unintentional.

## 2018-04-21 LAB — GLUCOSE, CAPILLARY: Glucose-Capillary: 85 mg/dL (ref 70–99)

## 2018-04-21 MED ORDER — POLYETHYLENE GLYCOL 3350 17 G PO PACK: 17.0000 g | PACK | Freq: Two times a day (BID) | ORAL | 0 refills | Status: DC

## 2018-04-21 NOTE — Progress Notes (Signed)
Patient cleared for discharge.   Education complete. AVS printed. Discharge instructions given. All questions answered for patient clarification.  Prescriptions given, pharmacy verified.  IV removed.  Discharged to home via POV     

## 2018-04-21 NOTE — Care Management Note (Signed)
Case Management Note  Patient Details  Name: Ashley Tanner MRN: [id number] Date of Birth: September 17, 1940    Patient discharged home today.  Patient declined all home health services at discharge.  Lives at home alone.  Nephews wife to transport at discharge.  PCP [facility].  Pharmacy [facility].  Patient denies any issues with transportation or obtaining medication.  Patient states she has a cane and RW in the home.  Subjective/Objective:                    Action/Plan:   Expected Discharge Date:  04/21/18               Expected Discharge Plan:  Home/Self Care  In-House Referral:     Discharge planning Services  CM Consult  Post Acute Care Choice:    Choice offered to:     DME Arranged:    DME Agency:     HH Arranged:  Patient Refused [place] Agency:     Status of Service:  Completed, signed off  If discussed at [employer] of Stay Meetings, dates discussed:    Additional Comments:  Beverly Sessions, RN 04/21/2018, 4:30 PM

## 2018-04-21 NOTE — Discharge Summary (Signed)
[facility] NAME: Ashley Tanner    MR#:  [id number]  DATE OF BIRTH:  December 20, 1940  DATE OF ADMISSION:  04/19/2018 ADMITTING PHYSICIAN: Lance Coon, MD  DATE OF DISCHARGE: 04/21/2018  PRIMARY CARE PHYSICIAN: Dion Body, MD    ADMISSION DIAGNOSIS:  Fecal impaction ([place]) [K56.41] Proctitis [K62.89]  DISCHARGE DIAGNOSIS:  Principal Problem:   Proctocolitis Active Problems:   Diabetes ([place])   HTN (hypertension)   GERD (gastroesophageal reflux disease)   SECONDARY DIAGNOSIS:   Past Medical History:  Diagnosis Date  . Arthritis   . B12 deficiency   . Chronic diarrhea   . Diabetes mellitus without complication ([place])   . GERD (gastroesophageal reflux disease)   . History of colon polyps   . History of hiatal hernia   . History of supraventricular tachycardia   . Hoarseness of voice   . Hypertension   . Kidney stone   . Osteoarthritis   . Other constipation   . Peripheral neuropathy   . Psoriasis     HOSPITAL COURSE:   78 year old female with a history of hypertension and diet-controlled diabetes who presents with rectal pain.  1.  Proctocolitis from constipation: Patient was evaluated by GI. Patient was manually disimpacted by GI physician.  Fleets enema and mineral oil enema were given.  Patient has had several bowel movements.  She will need to start 2 capsules of MiraLAX daily.    2.  Essential hypertension: Continue HCTZ  3.  Diet controlled diabetes   4.  Hypokalemia: This was repleted and has improved.    DISCHARGE CONDITIONS AND DIET:   Stable for discharge on regular diet  CONSULTS OBTAINED:  Treatment Team:  Efrain Sella, MD  DRUG ALLERGIES:   Allergies  Allergen Reactions  . Codeine Nausea Only    Nausea and dizziness  . Lisinopril Other (See Comments)    Weakness and dizziness  . Morphine And Related Nausea And Vomiting  . Zoloft [Sertraline Hcl] Other (See Comments)     Dizziness    DISCHARGE MEDICATIONS:   Allergies as of 04/21/2018      Reactions   Codeine Nausea Only   Nausea and dizziness   Lisinopril Other (See Comments)   Weakness and dizziness   Morphine And Related Nausea And Vomiting   Zoloft [sertraline Hcl] Other (See Comments)   Dizziness      Medication List    TAKE these medications   calcium-vitamin D 500-200 MG-UNIT tablet Commonly known as:  OSCAL WITH D Take 1 tablet by mouth daily.   docusate sodium 100 MG capsule Commonly known as:  COLACE Take 100 mg by mouth 2 (two) times daily.   hydrochlorothiazide 12.5 MG tablet Commonly known as:  HYDRODIURIL Take 12.5 mg by mouth daily.   pantoprazole 40 MG tablet Commonly known as:  PROTONIX Take 40 mg by mouth daily.   polyethylene glycol packet Commonly known as:  MIRALAX Take 17 g by mouth 2 (two) times daily.   vitamin B-12 1000 MCG tablet Commonly known as:  CYANOCOBALAMIN Take 1,000 mcg by mouth daily.         Today   CHIEF COMPLAINT:  Patient is doing well this morning.  No abdominal pain or nausea.   VITAL SIGNS:  Blood pressure 103/62, pulse (!) 55, temperature (!) 97.4 F (36.3 C), temperature source Oral, resp. rate 18, height 5\' 3"  (1.6 m), weight 64.4 kg, SpO2 99 %.   REVIEW OF SYSTEMS:  Review of Systems  Constitutional: Negative.  Negative for chills, fever and malaise/fatigue.  HENT: Negative.  Negative for ear discharge, ear pain, hearing loss, nosebleeds and sore throat.   Eyes: Negative.  Negative for blurred vision and pain.  Respiratory: Negative.  Negative for cough, hemoptysis, shortness of breath and wheezing.   Cardiovascular: Negative.  Negative for chest pain, palpitations and leg swelling.  Gastrointestinal: Negative.  Negative for abdominal pain, blood in stool, diarrhea, nausea and vomiting.  Genitourinary: Negative.  Negative for dysuria.  Musculoskeletal: Negative.  Negative for back pain.  Skin: Negative.    Neurological: Negative for dizziness, tremors, speech change, focal weakness, seizures and headaches.  Endo/Heme/Allergies: Negative.  Does not bruise/bleed easily.  Psychiatric/Behavioral: Negative.  Negative for depression, hallucinations and suicidal ideas.     PHYSICAL EXAMINATION:  GENERAL:  78 y.o.-year-old patient lying in the bed with no acute distress.  NECK:  Supple, no jugular venous distention. No thyroid enlargement, no tenderness.  LUNGS: Normal breath sounds bilaterally, no wheezing, rales,rhonchi  No use of accessory muscles of respiration.  CARDIOVASCULAR: S1, S2 normal. No murmurs, rubs, or gallops.  ABDOMEN: Soft, non-tender, non-distended. Bowel sounds present. No organomegaly or mass.  EXTREMITIES: No pedal edema, cyanosis, or clubbing.  PSYCHIATRIC: The patient is alert and oriented x 3.  SKIN: No obvious rash, lesion, or ulcer.   DATA REVIEW:   CBC Recent Labs  Lab 04/20/18 0326  WBC 10.8*  HGB 12.0  HCT 35.1*  PLT 248    Chemistries  Recent Labs  Lab 04/19/18 2216 04/20/18 0326  NA 139 140  K 3.1* 3.7  CL 105 108  CO2 25 26  GLUCOSE 133* 108*  BUN 16 13  CREATININE 0.78 0.70  CALCIUM 8.8* 8.5*  AST 15  --   ALT 10  --   ALKPHOS 92  --   BILITOT 1.8*  --     Cardiac Enzymes No results for input(s): TROPONINI in the last 168 hours.  Microbiology Results  @MICRORSLT48 @  RADIOLOGY:  Ct Abdomen Pelvis W Contrast  Result Date: 04/19/2018 CLINICAL DATA:  Rectal bleeding and diarrhea x2 days. EXAM: CT ABDOMEN AND PELVIS WITH CONTRAST TECHNIQUE: Multidetector CT imaging of the abdomen and pelvis was performed using the standard protocol following bolus administration of intravenous contrast. CONTRAST:  119mL OMNIPAQUE IOHEXOL 300 MG/ML  SOLN COMPARISON:  07/08/2017 FINDINGS: Lower chest: Large sliding hiatal hernia. Normal heart size without pericardial effusion to the extent included. Lung bases are clear. Hepatobiliary: No focal liver  abnormality is seen. No gallstones, gallbladder wall thickening, or biliary dilatation. Pancreas: Atrophic pancreas without mass or ductal dilatation. Spleen: Normal size spleen without focal mass. Adrenals/Urinary Tract: Normal bilateral adrenal glands. 14 x 8 x 10 mm nonobstructing right upper pole renal calculus. Mild cortical thinning of both kidneys. No enhancing mass. No hydroureteronephrosis. The urinary bladder is physiologically distended without focal mural thickening or calculus. Stomach/Bowel: Sliding hiatal hernia as described. Duodenal diverticulum off the third portion. Normal duodenal sweep otherwise. Ligament of Treitz in small bowel loops are unremarkable. Moderate stool retention throughout the colon with scattered colonic diverticulosis along the sigmoid. Mild transmural thickening of the rectum and distal sigmoid surrounding a moderate-sized stool ball raising concern for stercoral proctocolitis. Vascular/Lymphatic: Aortic atherosclerosis. No enlarged abdominal or pelvic lymph nodes. Reproductive: Status post hysterectomy. No adnexal masses. Other: No abdominal wall hernia or abnormality. No abdominopelvic ascites. Musculoskeletal: T12-L1 degenerative disc disease with posterior marginal osteophytes at L2 and L3 in particular. Facet arthropathy  without pars defects or listhesis. Levoconvex curvature of the lumbar spine with apex at L2. IMPRESSION: 1. Mild transmural thickening of the rectum and distal sigmoid surrounding a moderate-sized stool ball raises concern for stercoral proctocolitis. Increased stool retention throughout the colon is otherwise noted. 2. 14 x 8 x 10 mm nonobstructing right upper pole renal calculus. 3. Large sliding hiatal hernia. 4. Duodenal diverticulum off the third portion. Electronically Signed   By: Ashley Royalty M.D.   On: 04/19/2018 23:09      Allergies as of 04/21/2018      Reactions   Codeine Nausea Only   Nausea and dizziness   Lisinopril Other (See  Comments)   Weakness and dizziness   Morphine And Related Nausea And Vomiting   Zoloft [sertraline Hcl] Other (See Comments)   Dizziness      Medication List    TAKE these medications   calcium-vitamin D 500-200 MG-UNIT tablet Commonly known as:  OSCAL WITH D Take 1 tablet by mouth daily.   docusate sodium 100 MG capsule Commonly known as:  COLACE Take 100 mg by mouth 2 (two) times daily.   hydrochlorothiazide 12.5 MG tablet Commonly known as:  HYDRODIURIL Take 12.5 mg by mouth daily.   pantoprazole 40 MG tablet Commonly known as:  PROTONIX Take 40 mg by mouth daily.   polyethylene glycol packet Commonly known as:  MIRALAX Take 17 g by mouth 2 (two) times daily.   vitamin B-12 1000 MCG tablet Commonly known as:  CYANOCOBALAMIN Take 1,000 mcg by mouth daily.          Management plans discussed with the patient and she is in agreement. Stable for discharge home with [facility]  Patient should follow up with pcp  CODE STATUS:     Code Status Orders  (From admission, onward)         Start     Ordered   04/20/18 0043  Full code  Continuous     04/20/18 0042        Code Status History    This patient has a current code status but no historical code status.      TOTAL TIME TAKING CARE OF THIS PATIENT: 38 minutes.    Note: This dictation was prepared with Dragon dictation along with smaller phrase technology. Any transcriptional errors that result from this process are unintentional.  Harsimran Westman M.D on 04/21/2018 at 10:06 AM  Between 7am to 6pm - Pager - [phone] After 6pm go to [email or web address] - password [email or web address] [facility]  Office  [phone]  CC: Primary care physician; Dion Body, MD

## 2018-05-18 ENCOUNTER — Other Ambulatory Visit: Payer: Self-pay | Admitting: Pharmacist

## 2018-05-18 NOTE — Patient Outreach (Signed)
[facility]) Care Management  [place]   05/18/2018  Ashley Tanner [birth year] [id number]  Reason for referral: Medication Reconciliation Post Discharge  Referral source: [employer]   PMHx includes but not limited to: hx fecal impaction, proctitis, diabetes, HTN, GERD  Subjective:   Successful telephone call with patient. Admitted to [facility] 1/21-1/22/2020 for severe diarrhea, found to have fecal impaction. She notes that she is doing well since hospital discharge. She is hesitant to review medications over the phone, but agrees. Verifies that she has a PCP follow up at the end of March. Denies any further GI problems since discharge.   Objective: Lab Results  Component Value Date   CREATININE 0.70 04/20/2018   CREATININE 0.78 04/19/2018   CREATININE 1.12 (H) 08/20/2014    Hemoglobin A1c: A1c 6.0% (12/16/2017, Care Everywhere)  Lipid Panel  Cholesterol 2020 TG 99 HDL 64.8 LDL 117  (06/14/2017, Care Everywhere)   BP Readings from Last 3 Encounters:  04/21/18 103/62  02/03/18 133/87  10/29/17 105/71    Allergies  Allergen Reactions  . Codeine Nausea Only    Nausea and dizziness  . Lisinopril Other (See Comments)    Weakness and dizziness  . Morphine And Related Nausea And Vomiting  . Zoloft [Sertraline Hcl] Other (See Comments)    Dizziness    Medications Reviewed Today    Reviewed by De Hollingshead, [facility] (Pharmacist) on 05/18/18 at 61  Med List Status: <None>  Medication Order Taking? Sig Documenting Provider Last Dose Status Informant  calcium-vitamin D (OSCAL WITH D) 500-200 MG-UNIT per tablet [id number] Yes Take 1 tablet by mouth daily. [provider] Taking Active Pharmacy Records           Med Note Carola Frost Apr 19, 2018 10:22 PM)    docusate sodium (COLACE) 100 MG capsule [id number] Yes Take 100 mg by mouth 2 (two) times daily. [provider] Taking Active Pharmacy Records   hydrochlorothiazide (HYDRODIURIL) 12.5 MG tablet [id number] Yes Take 12.5 mg by mouth daily. [provider] Taking Active Pharmacy Records  pantoprazole (PROTONIX) 40 MG tablet [id number] Yes Take 40 mg by mouth daily. [provider] Taking Active Pharmacy Records  polyethylene glycol [facility]) packet [id number] Yes Take 17 g by mouth 2 (two) times daily. Bettey Costa, MD Taking Active            Med Note Darnelle Maffucci, Arville Lime   Wed May 18, 2018 10:22 AM) Taking once daily   vitamin B-12 (CYANOCOBALAMIN) 1000 MCG tablet [id number] Yes Take 1,000 mcg by mouth daily. [provider] Taking Active Pharmacy Records          Assessment:  Date Discharged from Hospital: 04/20/2018 Date Medication Reconciliation Performed: 05/18/2018  Medications:  New at Discharge: Marland Kitchen Miralax   Patient was recently discharged from hospital and all medications have been reviewed.  Medication Review Findings:  . Pantoprazole + calcium carbonate: PPIs may decrease absorption of calcium carbonate; recommend changing to calcium citrate formulation for optimal calcium absorption    Plan: - Patient denies any questions, concerns, or affordability problems at this time. She has my contact information for any future needs.  - Will route note to Dr. Netty Starring for notification  [place], PharmD, [place] PGY2 Ambulatory Care Pharmacy Resident, [place] Phone: [phone]

## 2018-11-14 DIAGNOSIS — K219 Gastro-esophageal reflux disease without esophagitis: Secondary | ICD-10-CM | POA: Diagnosis not present

## 2018-11-14 DIAGNOSIS — I10 Essential (primary) hypertension: Secondary | ICD-10-CM | POA: Diagnosis not present

## 2018-11-14 DIAGNOSIS — R7303 Prediabetes: Secondary | ICD-10-CM | POA: Diagnosis not present

## 2018-11-25 DIAGNOSIS — I10 Essential (primary) hypertension: Secondary | ICD-10-CM | POA: Diagnosis not present

## 2018-11-25 DIAGNOSIS — R7303 Prediabetes: Secondary | ICD-10-CM | POA: Diagnosis not present

## 2018-11-25 DIAGNOSIS — Z0000 Encounter for general adult medical examination without abnormal findings: Secondary | ICD-10-CM | POA: Diagnosis not present

## 2019-01-02 ENCOUNTER — Other Ambulatory Visit: Payer: Self-pay

## 2019-01-02 ENCOUNTER — Ambulatory Visit: Payer: PPO | Admitting: Physician Assistant

## 2019-01-02 ENCOUNTER — Encounter: Payer: Self-pay | Admitting: Physician Assistant

## 2019-01-02 VITALS — BP 124/76 | HR 102 | Ht 63.0 in | Wt 147.0 lb

## 2019-01-02 DIAGNOSIS — R319 Hematuria, unspecified: Secondary | ICD-10-CM | POA: Diagnosis not present

## 2019-01-02 DIAGNOSIS — R310 Gross hematuria: Secondary | ICD-10-CM | POA: Diagnosis not present

## 2019-01-02 NOTE — Progress Notes (Signed)
01/02/2019 1:35 PM   Ashley Tanner 24-May-1940 [id number]  CC: Hematuria  HPI: Ashley Tanner is a 78 y.o. female who presents today for evaluation of possible UTI. She is an established [facility] patient who last saw Dr. Diamantina Providence on 02/03/2018 for follow-up nephrolithiasis with one episode gross hematuria.  CT AP with contrast on 04/19/2018 with a 14 x 8 x 10 mm nonobstructing right upper pole renal calculus, previously identified per CT.  Today she reports a single episode of gross hematuria that occurred 3 days ago and has since resolved.  She describes the hematuria as brown in color.  She wonders if the bleeding may have originated in her reproductive organs.  She is s/p partial hysterectomy in 1990.  She reports she still has her ovaries and is unsure if she still has her cervix. She is not on anticoagulants.  She denies flank pain, dysuria, urgency, and frequency.  In-office UA today positive for trace-intact blood, nitrites, and 1+ leukocyte esterase; urine microscopy with 11-30 WBCs/HPF and many bacteria.  PMH: Past Medical History:  Diagnosis Date  . Arthritis   . B12 deficiency   . Chronic diarrhea   . Diabetes mellitus without complication ([place])   . GERD (gastroesophageal reflux disease)   . History of colon polyps   . History of hiatal hernia   . History of supraventricular tachycardia   . Hoarseness of voice   . Hypertension   . Kidney stone   . Osteoarthritis   . Other constipation   . Peripheral neuropathy   . Psoriasis     Surgical History: Past Surgical History:  Procedure Laterality Date  . ABDOMINAL HYSTERECTOMY    . BREAST BIOPSY Left 01/29/2003   Fibrocystic changes, ductal adenosis, microcalcifications  . BREAST BIOPSY Left 03/29/2017   Affirm Bx- SUPERIOR, CENTRAL, POSTERIOR; STEREOTACTIC BIOPSY: ORGANIZING FAT NECROSIS  . BREAST SURGERY Left November 2004   Fibrocystic changes, ductal adenosis, microcalcifications  . COLONOSCOPY  2013   Dr Vira Agar   . COLONOSCOPY WITH PROPOFOL N/A 10/29/2017   Procedure: COLONOSCOPY WITH PROPOFOL;  Surgeon: Manya Silvas, MD;  Location: [facility] ENDOSCOPY;  Service: Endoscopy;  Laterality: N/A;  . ESOPHAGOGASTRODUODENOSCOPY    . EYE SURGERY     Cataract surgery  . FLEXIBLE SIGMOIDOSCOPY    . HERNIA REPAIR  March 2011   [facility], intrathoracic hiatal hernia  . KNEE ARTHROSCOPY Left 2014    Home Medications:  Allergies as of 01/02/2019      Reactions   Codeine Nausea Only   Nausea and dizziness   Lisinopril Other (See Comments)   Weakness and dizziness   Morphine And Related Nausea And Vomiting   Zoloft [sertraline Hcl] Other (See Comments)   Dizziness      Medication List       Accurate as of January 02, 2019 11:59 PM. If you have any questions, ask your nurse or doctor.        calcium-vitamin D 500-200 MG-UNIT tablet Commonly known as: OSCAL WITH D Take 1 tablet by mouth daily.   docusate sodium 100 MG capsule Commonly known as: COLACE Take 100 mg by mouth 2 (two) times daily.   hydrochlorothiazide 12.5 MG tablet Commonly known as: HYDRODIURIL Take 12.5 mg by mouth daily.   pantoprazole 40 MG tablet Commonly known as: PROTONIX Take 40 mg by mouth daily.   polyethylene glycol 17 g packet Commonly known as: MiraLax Take 17 g by mouth 2 (two) times daily.   vitamin B-12 1000  MCG tablet Commonly known as: CYANOCOBALAMIN Take 1,000 mcg by mouth daily.       Allergies:  Allergies  Allergen Reactions  . Codeine Nausea Only    Nausea and dizziness  . Lisinopril Other (See Comments)    Weakness and dizziness  . Morphine And Related Nausea And Vomiting  . Zoloft [Sertraline Hcl] Other (See Comments)    Dizziness    Family History: Family History  Problem Relation Age of Onset  . Prostate cancer Neg Hx   . Bladder Cancer Neg Hx   . Kidney cancer Neg Hx     Social History:   reports that she has never smoked. She has never used smokeless tobacco. She reports that she  does not drink alcohol or use drugs.  ROS: UROLOGY Frequent Urination?: No Hard to postpone urination?: No Burning/pain with urination?: No Get up at night to urinate?: No Leakage of urine?: No Urine stream starts and stops?: No Trouble starting stream?: No Do you have to strain to urinate?: No Blood in urine?: Yes Urinary tract infection?: No Sexually transmitted disease?: No Injury to kidneys or bladder?: No Painful intercourse?: No Weak stream?: No Currently pregnant?: No Vaginal bleeding?: No Last menstrual period?: n  Gastrointestinal Nausea?: No Vomiting?: No Indigestion/heartburn?: No Diarrhea?: No Constipation?: No  Constitutional Fever: No Night sweats?: No Weight loss?: No Fatigue?: No  Skin Skin rash/lesions?: No Itching?: No  Eyes Blurred vision?: No Double vision?: No  Ears/Nose/Throat Sore throat?: No Sinus problems?: No  Hematologic/Lymphatic Swollen glands?: No Easy bruising?: No  Cardiovascular Leg swelling?: No Chest pain?: No  Respiratory Cough?: No Shortness of breath?: No  Endocrine Excessive thirst?: No  Musculoskeletal Back pain?: No Joint pain?: No  Neurological Headaches?: No Dizziness?: No  Psychologic Depression?: No Anxiety?: No  Physical Exam: BP 124/76 (BP Location: Left Arm, Patient Position: Sitting, Cuff Size: Normal)   Pulse (!) 102   Ht 5\' 3"  (1.6 m)   Wt 147 lb (66.7 kg)   BMI 26.04 kg/m   Constitutional:  Alert and oriented, no acute distress, nontoxic appearing HEENT: [place], AT Cardiovascular: No clubbing, cyanosis, or edema Respiratory: Normal respiratory effort, no increased work of breathing Skin: No rashes, bruises or suspicious lesions Neurologic: Grossly intact, no focal deficits, moving all 4 extremities, walks with a cane Psychiatric: Normal mood and affect  Laboratory Data: Results for orders placed or performed in visit on 01/02/19  Microscopic Examination   URINE  Result Value  Ref Range   WBC, UA 11-30 (A) 0 - 5 /hpf   RBC 0-2 0 - 2 /hpf   Epithelial Cells (non renal) 0-10 0 - 10 /hpf   Bacteria, UA Many (A) None seen/Few  Urinalysis, Complete  Result Value Ref Range   Specific Gravity, UA 1.025 1.005 - 1.030   pH, UA 5.0 5.0 - 7.5   Color, UA Yellow Yellow   Appearance Ur Cloudy (A) Clear   Leukocytes,UA 1+ (A) Negative   Protein,UA Negative Negative/Trace   Glucose, UA Negative Negative   Ketones, UA Negative Negative   RBC, UA Trace (A) Negative   Bilirubin, UA Negative Negative   Urobilinogen, Ur 0.2 0.2 - 1.0 mg/dL   Nitrite, UA Positive (A) Negative   Microscopic Examination See below:    Pertinent Imaging: CTAP with contrast, 04/19/2018: CLINICAL DATA:  Rectal bleeding and diarrhea x2 days.  EXAM: CT ABDOMEN AND PELVIS WITH CONTRAST  TECHNIQUE: Multidetector CT imaging of the abdomen and pelvis was performed using the standard  protocol following bolus administration of intravenous contrast.  CONTRAST:  141mL OMNIPAQUE IOHEXOL 300 MG/ML  SOLN  COMPARISON:  07/08/2017  FINDINGS: Lower chest: Large sliding hiatal hernia. Normal heart size without pericardial effusion to the extent included. Lung bases are clear.  Hepatobiliary: No focal liver abnormality is seen. No gallstones, gallbladder wall thickening, or biliary dilatation.  Pancreas: Atrophic pancreas without mass or ductal dilatation.  Spleen: Normal size spleen without focal mass.  Adrenals/Urinary Tract: Normal bilateral adrenal glands. 14 x 8 x 10 mm nonobstructing right upper pole renal calculus. Mild cortical thinning of both kidneys. No enhancing mass. No hydroureteronephrosis. The urinary bladder is physiologically distended without focal mural thickening or calculus.  Stomach/Bowel: Sliding hiatal hernia as described. Duodenal diverticulum off the third portion. Normal duodenal sweep otherwise. Ligament of Treitz in small bowel loops are unremarkable.  Moderate stool retention throughout the colon with scattered colonic diverticulosis along the sigmoid. Mild transmural thickening of the rectum and distal sigmoid surrounding a moderate-sized stool ball raising concern for stercoral proctocolitis.  Vascular/Lymphatic: Aortic atherosclerosis. No enlarged abdominal or pelvic lymph nodes.  Reproductive: Status post hysterectomy. No adnexal masses.  Other: No abdominal wall hernia or abnormality. No abdominopelvic ascites.  Musculoskeletal: T12-L1 degenerative disc disease with posterior marginal osteophytes at L2 and L3 in particular. Facet arthropathy without pars defects or listhesis. Levoconvex curvature of the lumbar spine with apex at L2.  IMPRESSION: 1. Mild transmural thickening of the rectum and distal sigmoid surrounding a moderate-sized stool ball raises concern for stercoral proctocolitis. Increased stool retention throughout the colon is otherwise noted. 2. 14 x 8 x 10 mm nonobstructing right upper pole renal calculus. 3. Large sliding hiatal hernia. 4. Duodenal diverticulum off the third portion.   Electronically Signed   By: Ashley Royalty M.D.   On: 04/19/2018 23:09  I personally reviewed the images referenced above and note the presence of a large nonobstructing right renal calculus.  Assessment & Plan:   1. Gross hematuria Patient with a single episode of gross hematuria that has resolved, with grossly infected urine per UA today.  She denies irritative voiding symptoms and has no MH per UA. Will send for culture.  Differential for gross hematuria includes acute cystitis, nephrolithiasis, lesions of the urinary tract, and pseudo-hematuria.  She elected to defer hematuria work-up with Dr. Diamantina Providence 1 year ago.  Absence of uterus reduces the likelihood of pseudo-hematuria, however it does not negate it entirely.  I believe her current symptom is most likely associated with her apparent urinary tract infection  and/or known nephrolithiasis.  Will treat infection per culture results and order further imaging pending symptom recurrence. - Urinalysis, Complete - CULTURE, URINE COMPREHENSIVE  Debroah Loop, PA-C  [facility] [address], [place], [place] [address] [phone]

## 2019-01-03 LAB — URINALYSIS, COMPLETE
Bilirubin, UA: NEGATIVE
Glucose, UA: NEGATIVE
Ketones, UA: NEGATIVE
Nitrite, UA: POSITIVE — AB
Protein,UA: NEGATIVE
Specific Gravity, UA: 1.025 (ref 1.005–1.030)
Urobilinogen, Ur: 0.2 mg/dL (ref 0.2–1.0)
pH, UA: 5 (ref 5.0–7.5)

## 2019-01-03 LAB — MICROSCOPIC EXAMINATION

## 2019-01-05 ENCOUNTER — Other Ambulatory Visit: Payer: Self-pay | Admitting: Physician Assistant

## 2019-01-05 LAB — CULTURE, URINE COMPREHENSIVE

## 2019-01-05 MED ORDER — SULFAMETHOXAZOLE-TRIMETHOPRIM 800-160 MG PO TABS: 1.0000 | ORAL_TABLET | Freq: Two times a day (BID) | ORAL | 0 refills | Status: AC

## 2019-01-05 NOTE — Progress Notes (Signed)
Patient's urine culture resulted with >100k CFUs/mL E coli. Bactrim DS BID x5 days sent to her pharmacy. Notified patient via telephone. She expressed understanding. I counseled her to call the office the next time she sees blood in her urine so that she can drop off a same-day urine sample for UA. She expressed understanding.

## 2019-02-06 ENCOUNTER — Ambulatory Visit
Admission: RE | Admit: 2019-02-06 | Discharge: 2019-02-06 | Disposition: A | Discharge status: Home / Self Care | Payer: PPO | Source: Ambulatory Visit | Attending: Urology | Admitting: Urology

## 2019-02-06 ENCOUNTER — Ambulatory Visit: Payer: PPO | Admitting: Urology

## 2019-02-06 ENCOUNTER — Other Ambulatory Visit: Payer: Self-pay

## 2019-02-06 ENCOUNTER — Encounter: Payer: Self-pay | Admitting: Urology

## 2019-02-06 ENCOUNTER — Other Ambulatory Visit: Payer: Self-pay | Admitting: Urology

## 2019-02-06 VITALS — BP 118/82 | HR 99 | Ht 63.0 in | Wt 146.0 lb

## 2019-02-06 DIAGNOSIS — N200 Calculus of kidney: Secondary | ICD-10-CM | POA: Insufficient documentation

## 2019-02-06 DIAGNOSIS — R310 Gross hematuria: Secondary | ICD-10-CM

## 2019-02-06 LAB — URINALYSIS, COMPLETE
Bilirubin, UA: NEGATIVE
Glucose, UA: NEGATIVE
Ketones, UA: NEGATIVE
Nitrite, UA: POSITIVE — AB
Specific Gravity, UA: 1.025 (ref 1.005–1.030)
Urobilinogen, Ur: 0.2 mg/dL (ref 0.2–1.0)
pH, UA: 5.5 (ref 5.0–7.5)

## 2019-02-06 LAB — MICROSCOPIC EXAMINATION
Epithelial Cells (non renal): NONE SEEN /hpf (ref 0–10)
RBC, Urine: NONE SEEN /hpf (ref 0–2)

## 2019-02-07 ENCOUNTER — Telehealth: Payer: Self-pay

## 2019-02-07 NOTE — Progress Notes (Signed)
   02/07/2019 7:58 AM   Ashley Tanner 1940/12/06 [id number]  Reason for visit: Follow up right upper pole stone  HPI: I saw Ashley Tanner back in urology clinic today for follow-up of nephrolithiasis. She is a 78 year old female previously followed by Dr. Pilar Jarvis. She has had a known right upper pole nonobstructing 1.2 cm asymptomatic stone since at least Apr 2019. She is not had any flank or groin pain. She has had mild gross hematuria in the past and deferred further work-up with cystoscopy. She had a recent episode of hematuria associated with UTI in Oct 2020, but otherwise denies any asymptomatic gross hematuria in the last year. She is a never smoker, and has no carcinogenic exposures. She lost her husband about a year ago, and continues to be very tearful about her loss. She denies any flank pain, fevers, or chills. She does not have significant recurrent UTIs. She denies any significant urinary symptoms. Overall, she feels like she is doing well.  KUB reviewed today, stable 1.2 cm right upper pole stone.  We discussed options for her asymptomatic nonobstructing right-sided upper pole stone including observation, shockwave lithotripsy, or ureteroscopy. As she has remained asymptomatic for over a year she would like to continue observation. We discussed the risks of renal colic, bleeding, infection, and sepsis. We again discussed the low, but not 0, risk of missing a bladder malignancy with her episodes of gross hematuria in the past and deferring cystoscopy, and she also understands these risks.   RTC 1 year with KUB, sooner if right-sided flank pain, recurrent gross hematuria, or recurrent UTIs  A total of 15 minutes were spent face-to-face with the patient, greater than 50% was spent in patient education, counseling, and coordination of care regarding nephrolithiasis and hematuria.  Billey Co, [facility] [address], [place], [place]  [phone]

## 2019-02-07 NOTE — Telephone Encounter (Signed)
-----   Message from Billey Co, MD sent at 02/07/2019  8:04 AM EST ----- Her renal stone is unchanged on xray this year, ok to continue observation. If she has right sided flank pain she should let us know right away, thanks  Nickolas Madrid, MD 02/07/2019

## 2019-02-07 NOTE — Telephone Encounter (Signed)
Patient aware of results and recommendations. °

## 2019-02-24 ENCOUNTER — Encounter: Payer: Self-pay | Admitting: Emergency Medicine

## 2019-02-24 ENCOUNTER — Emergency Department: Payer: PPO

## 2019-02-24 ENCOUNTER — Other Ambulatory Visit: Payer: Self-pay

## 2019-02-24 ENCOUNTER — Inpatient Hospital Stay
Admission: EM | Admit: 2019-02-24 | Discharge: 2019-02-27 | DRG: 659 | Disposition: A | Discharge status: Home Health | Payer: PPO | Attending: Family Medicine | Admitting: Family Medicine

## 2019-02-24 DIAGNOSIS — E538 Deficiency of other specified B group vitamins: Secondary | ICD-10-CM | POA: Diagnosis not present

## 2019-02-24 DIAGNOSIS — R55 Syncope and collapse: Secondary | ICD-10-CM | POA: Diagnosis not present

## 2019-02-24 DIAGNOSIS — Z79899 Other long term (current) drug therapy: Secondary | ICD-10-CM

## 2019-02-24 DIAGNOSIS — E876 Hypokalemia: Secondary | ICD-10-CM | POA: Diagnosis not present

## 2019-02-24 DIAGNOSIS — I2510 Atherosclerotic heart disease of native coronary artery without angina pectoris: Secondary | ICD-10-CM | POA: Diagnosis not present

## 2019-02-24 DIAGNOSIS — M1990 Unspecified osteoarthritis, unspecified site: Secondary | ICD-10-CM | POA: Diagnosis not present

## 2019-02-24 DIAGNOSIS — N390 Urinary tract infection, site not specified: Secondary | ICD-10-CM | POA: Diagnosis present

## 2019-02-24 DIAGNOSIS — K449 Diaphragmatic hernia without obstruction or gangrene: Secondary | ICD-10-CM | POA: Diagnosis not present

## 2019-02-24 DIAGNOSIS — Z23 Encounter for immunization: Secondary | ICD-10-CM | POA: Diagnosis not present

## 2019-02-24 DIAGNOSIS — R110 Nausea: Secondary | ICD-10-CM

## 2019-02-24 DIAGNOSIS — I469 Cardiac arrest, cause unspecified: Secondary | ICD-10-CM | POA: Diagnosis not present

## 2019-02-24 DIAGNOSIS — I4510 Unspecified right bundle-branch block: Secondary | ICD-10-CM | POA: Diagnosis not present

## 2019-02-24 DIAGNOSIS — R4020 Unspecified coma: Secondary | ICD-10-CM | POA: Diagnosis not present

## 2019-02-24 DIAGNOSIS — E119 Type 2 diabetes mellitus without complications: Secondary | ICD-10-CM

## 2019-02-24 DIAGNOSIS — G629 Polyneuropathy, unspecified: Secondary | ICD-10-CM | POA: Diagnosis present

## 2019-02-24 DIAGNOSIS — Z20828 Contact with and (suspected) exposure to other viral communicable diseases: Secondary | ICD-10-CM | POA: Diagnosis not present

## 2019-02-24 DIAGNOSIS — Z90710 Acquired absence of both cervix and uterus: Secondary | ICD-10-CM

## 2019-02-24 DIAGNOSIS — I700 Atherosclerosis of aorta: Secondary | ICD-10-CM | POA: Diagnosis not present

## 2019-02-24 DIAGNOSIS — M419 Scoliosis, unspecified: Secondary | ICD-10-CM | POA: Diagnosis not present

## 2019-02-24 DIAGNOSIS — N132 Hydronephrosis with renal and ureteral calculous obstruction: Secondary | ICD-10-CM | POA: Diagnosis not present

## 2019-02-24 DIAGNOSIS — B9620 Unspecified Escherichia coli [E. coli] as the cause of diseases classified elsewhere: Secondary | ICD-10-CM | POA: Diagnosis present

## 2019-02-24 DIAGNOSIS — D6959 Other secondary thrombocytopenia: Secondary | ICD-10-CM | POA: Diagnosis not present

## 2019-02-24 DIAGNOSIS — R1084 Generalized abdominal pain: Secondary | ICD-10-CM | POA: Diagnosis not present

## 2019-02-24 DIAGNOSIS — I10 Essential (primary) hypertension: Secondary | ICD-10-CM | POA: Diagnosis not present

## 2019-02-24 DIAGNOSIS — Z87442 Personal history of urinary calculi: Secondary | ICD-10-CM

## 2019-02-24 DIAGNOSIS — K5730 Diverticulosis of large intestine without perforation or abscess without bleeding: Secondary | ICD-10-CM | POA: Diagnosis not present

## 2019-02-24 DIAGNOSIS — K920 Hematemesis: Secondary | ICD-10-CM | POA: Diagnosis not present

## 2019-02-24 DIAGNOSIS — Z8719 Personal history of other diseases of the digestive system: Secondary | ICD-10-CM

## 2019-02-24 DIAGNOSIS — R310 Gross hematuria: Secondary | ICD-10-CM | POA: Diagnosis not present

## 2019-02-24 DIAGNOSIS — K219 Gastro-esophageal reflux disease without esophagitis: Secondary | ICD-10-CM | POA: Diagnosis present

## 2019-02-24 DIAGNOSIS — E1140 Type 2 diabetes mellitus with diabetic neuropathy, unspecified: Secondary | ICD-10-CM | POA: Diagnosis not present

## 2019-02-24 DIAGNOSIS — R079 Chest pain, unspecified: Secondary | ICD-10-CM | POA: Diagnosis not present

## 2019-02-24 DIAGNOSIS — R0602 Shortness of breath: Secondary | ICD-10-CM | POA: Diagnosis not present

## 2019-02-24 DIAGNOSIS — E1142 Type 2 diabetes mellitus with diabetic polyneuropathy: Secondary | ICD-10-CM | POA: Diagnosis present

## 2019-02-24 DIAGNOSIS — N200 Calculus of kidney: Secondary | ICD-10-CM | POA: Diagnosis not present

## 2019-02-24 DIAGNOSIS — D6489 Other specified anemias: Secondary | ICD-10-CM | POA: Diagnosis present

## 2019-02-24 DIAGNOSIS — Z03818 Encounter for observation for suspected exposure to other biological agents ruled out: Secondary | ICD-10-CM | POA: Diagnosis not present

## 2019-02-24 DIAGNOSIS — I213 ST elevation (STEMI) myocardial infarction of unspecified site: Secondary | ICD-10-CM | POA: Diagnosis not present

## 2019-02-24 DIAGNOSIS — R112 Nausea with vomiting, unspecified: Secondary | ICD-10-CM

## 2019-02-24 DIAGNOSIS — I499 Cardiac arrhythmia, unspecified: Secondary | ICD-10-CM | POA: Diagnosis not present

## 2019-02-24 LAB — MRSA PCR SCREENING: MRSA by PCR: NEGATIVE

## 2019-02-24 LAB — URINALYSIS, ROUTINE W REFLEX MICROSCOPIC
Bilirubin Urine: NEGATIVE
Glucose, UA: NEGATIVE mg/dL
Hgb urine dipstick: NEGATIVE
Ketones, ur: 5 mg/dL — AB
Nitrite: POSITIVE — AB
Protein, ur: NEGATIVE mg/dL
Specific Gravity, Urine: 1.031 — ABNORMAL HIGH (ref 1.005–1.030)
pH: 5 (ref 5.0–8.0)

## 2019-02-24 LAB — CBC WITH DIFFERENTIAL/PLATELET
Abs Immature Granulocytes: 0.1 10*3/uL — ABNORMAL HIGH (ref 0.00–0.07)
Basophils Absolute: 0 10*3/uL (ref 0.0–0.1)
Basophils Relative: 0 %
Eosinophils Absolute: 0 10*3/uL (ref 0.0–0.5)
Eosinophils Relative: 0 %
HCT: 36.7 % (ref 36.0–46.0)
Hemoglobin: 12.7 g/dL (ref 12.0–15.0)
Immature Granulocytes: 1 %
Lymphocytes Relative: 4 %
Lymphs Abs: 0.3 10*3/uL — ABNORMAL LOW (ref 0.7–4.0)
MCH: 31.4 pg (ref 26.0–34.0)
MCHC: 34.6 g/dL (ref 30.0–36.0)
MCV: 90.6 fL (ref 80.0–100.0)
Monocytes Absolute: 0 10*3/uL — ABNORMAL LOW (ref 0.1–1.0)
Monocytes Relative: 0 %
Neutro Abs: 7.1 10*3/uL (ref 1.7–7.7)
Neutrophils Relative %: 95 %
Platelets: 233 10*3/uL (ref 150–400)
RBC: 4.05 MIL/uL (ref 3.87–5.11)
RDW: 12.4 % (ref 11.5–15.5)
WBC: 7.6 10*3/uL (ref 4.0–10.5)
nRBC: 0 % (ref 0.0–0.2)

## 2019-02-24 LAB — COMPREHENSIVE METABOLIC PANEL
ALT: 11 U/L (ref 0–44)
AST: 18 U/L (ref 15–41)
Albumin: 3.5 g/dL (ref 3.5–5.0)
Alkaline Phosphatase: 70 U/L (ref 38–126)
Anion gap: 11 (ref 5–15)
BUN: 18 mg/dL (ref 8–23)
CO2: 21 mmol/L — ABNORMAL LOW (ref 22–32)
Calcium: 8.8 mg/dL — ABNORMAL LOW (ref 8.9–10.3)
Chloride: 105 mmol/L (ref 98–111)
Creatinine, Ser: 0.87 mg/dL (ref 0.44–1.00)
GFR calc Af Amer: >60 mL/min (ref >60)
GFR calc non Af Amer: >60 mL/min (ref >60)
Glucose, Bld: 168 mg/dL — ABNORMAL HIGH (ref 70–99)
Potassium: 2.7 mmol/L — CL (ref 3.5–5.1)
Sodium: 137 mmol/L (ref 135–145)
Total Bilirubin: 2.2 mg/dL — ABNORMAL HIGH (ref 0.3–1.2)
Total Protein: 6.3 g/dL — ABNORMAL LOW (ref 6.5–8.1)

## 2019-02-24 LAB — TSH: TSH: 5.45 u[IU]/mL — ABNORMAL HIGH (ref 0.350–4.500)

## 2019-02-24 LAB — TROPONIN I (HIGH SENSITIVITY)
Troponin I (High Sensitivity): 11 ng/L (ref <18)
Troponin I (High Sensitivity): 11 ng/L (ref <18)

## 2019-02-24 LAB — LIPASE, BLOOD: Lipase: 22 U/L (ref 11–51)

## 2019-02-24 LAB — TYPE AND SCREEN
ABO/RH(D): O NEG
Antibody Screen: NEGATIVE

## 2019-02-24 LAB — MAGNESIUM: Magnesium: 1.4 mg/dL — ABNORMAL LOW (ref 1.7–2.4)

## 2019-02-24 LAB — GLUCOSE, CAPILLARY: Glucose-Capillary: 139 mg/dL — ABNORMAL HIGH (ref 70–99)

## 2019-02-24 LAB — SARS CORONAVIRUS 2 BY RT PCR (HOSPITAL ORDER, PERFORMED IN ~~LOC~~ HOSPITAL LAB): SARS Coronavirus 2: NEGATIVE

## 2019-02-24 LAB — APTT: aPTT: 25 seconds (ref 24–36)

## 2019-02-24 LAB — PROTIME-INR
INR: 1 (ref 0.8–1.2)
Prothrombin Time: 13.1 seconds (ref 11.4–15.2)

## 2019-02-24 MED ORDER — ONDANSETRON HCL 4 MG/2ML IJ SOLN
4.0000 mg | Freq: Four times a day (QID) | INTRAMUSCULAR | Status: DC | PRN
  Administered 2019-02-24 – 2019-02-25 (×2): 4 mg via INTRAVENOUS
  Filled 2019-02-24: qty 2

## 2019-02-24 MED ORDER — MAGNESIUM SULFATE 2 GM/50ML IV SOLN
2.0000 g | Freq: Once | INTRAVENOUS | Status: AC
  Administered 2019-02-24: 16:00:00 2 g via INTRAVENOUS
  Filled 2019-02-24: qty 50

## 2019-02-24 MED ORDER — IOHEXOL 350 MG/ML SOLN
75.0000 mL | Freq: Once | INTRAVENOUS | Status: AC | PRN
  Administered 2019-02-24: 14:00:00 75 mL via INTRAVENOUS

## 2019-02-24 MED ORDER — ONDANSETRON HCL 4 MG PO TABS: 4.0000 mg | ORAL_TABLET | Freq: Four times a day (QID) | ORAL | Status: DC | PRN

## 2019-02-24 MED ORDER — SODIUM CHLORIDE 0.9 % IV SOLN
80.0000 mg | Freq: Once | INTRAVENOUS | Status: DC
  Administered 2019-02-24: 80 mg via INTRAVENOUS
  Filled 2019-02-24: qty 80

## 2019-02-24 MED ORDER — POTASSIUM CHLORIDE 10 MEQ/100ML IV SOLN
10.0000 meq | INTRAVENOUS | Status: AC
  Administered 2019-02-24 (×3): 10 meq via INTRAVENOUS
  Filled 2019-02-24 (×3): qty 100

## 2019-02-24 MED ORDER — ACETAMINOPHEN 650 MG RE SUPP: 650.0000 mg | Freq: Four times a day (QID) | RECTAL | Status: DC | PRN

## 2019-02-24 MED ORDER — POTASSIUM CHLORIDE 10 MEQ/100ML IV SOLN
10.0000 meq | INTRAVENOUS | Status: AC
  Administered 2019-02-24 (×2): 10 meq via INTRAVENOUS
  Filled 2019-02-24 (×3): qty 100

## 2019-02-24 MED ORDER — SENNOSIDES-DOCUSATE SODIUM 8.6-50 MG PO TABS: 1.0000 | ORAL_TABLET | Freq: Every evening | ORAL | Status: DC | PRN

## 2019-02-24 MED ORDER — POTASSIUM CHLORIDE 10 MEQ/100ML IV SOLN: 10.0000 meq | INTRAVENOUS | Status: DC

## 2019-02-24 MED ORDER — ENOXAPARIN SODIUM 40 MG/0.4ML ~~LOC~~ SOLN
40.0000 mg | SUBCUTANEOUS | Status: DC
  Administered 2019-02-24: 22:00:00 40 mg via SUBCUTANEOUS
  Filled 2019-02-24: qty 0.4

## 2019-02-24 MED ORDER — ACETAMINOPHEN 325 MG PO TABS: 650.0000 mg | ORAL_TABLET | Freq: Four times a day (QID) | ORAL | Status: DC | PRN

## 2019-02-24 MED ORDER — TAMSULOSIN HCL 0.4 MG PO CAPS
0.4000 mg | ORAL_CAPSULE | Freq: Every day | ORAL | Status: DC
  Filled 2019-02-24: qty 1

## 2019-02-24 MED ORDER — METOCLOPRAMIDE HCL 5 MG/ML IJ SOLN
10.0000 mg | Freq: Once | INTRAMUSCULAR | Status: AC
  Administered 2019-02-24: 13:00:00 10 mg via INTRAVENOUS
  Filled 2019-02-24: qty 2

## 2019-02-24 MED ORDER — SODIUM CHLORIDE 0.9 % IV SOLN
1.0000 g | INTRAVENOUS | Status: DC
  Administered 2019-02-24 – 2019-02-26 (×3): 1 g via INTRAVENOUS
  Filled 2019-02-24: qty 1
  Filled 2019-02-24: qty 10
  Filled 2019-02-24: qty 1
  Filled 2019-02-24: qty 10

## 2019-02-24 MED ORDER — SODIUM CHLORIDE 0.9 % IV SOLN
INTRAVENOUS | Status: AC
  Administered 2019-02-24 (×2): via INTRAVENOUS

## 2019-02-24 MED ORDER — POTASSIUM CHLORIDE CRYS ER 20 MEQ PO TBCR
40.0000 meq | EXTENDED_RELEASE_TABLET | ORAL | Status: DC
  Filled 2019-02-24: qty 2

## 2019-02-24 NOTE — ED Provider Notes (Signed)
[facility] Emergency Department Provider Note  ____________________________________________   First MD Initiated Contact with Patient 02/24/19 1231     (approximate)  I have reviewed the triage vital signs and the nursing notes.   HISTORY  Chief Complaint Abdominal Pain    HPI Ashley Tanner is a 78 y.o. female with diabetes, known kidney stones who presents for LOC.  There called out for abdominal pain.  Patient has right abdominal pain is severe, constant, onset yesterday, nothing makes it better, nothing makes it worse.  Associated with bloody emesis.  On route to the hospital patient bradycardia down went asystole and got CPR for approximately 20 seconds with an she woke up and was responsive again.  She has chronic hematuria followed by urology.  Denies any rectal bleeding that she knows of.          Past Medical History:  Diagnosis Date  . Arthritis   . B12 deficiency   . Chronic diarrhea   . Diabetes mellitus without complication ([place])   . GERD (gastroesophageal reflux disease)   . History of colon polyps   . History of hiatal hernia   . History of supraventricular tachycardia   . Hoarseness of voice   . Hypertension   . Kidney stone   . Osteoarthritis   . Other constipation   . Peripheral neuropathy   . Psoriasis     Patient Active Problem List   Diagnosis Date Noted  . Diabetes ([place]) 04/19/2018  . HTN (hypertension) 04/19/2018  . Proctocolitis 04/19/2018  . Slow transit constipation 08/19/2017  . Encounter for general adult medical examination without abnormal findings 06/16/2016  . Situational anxiety 06/16/2016  . Sliding hiatal hernia 01/30/2016  . Hoarseness of voice 12/10/2015  . Borderline diabetes mellitus 06/17/2015  . Vaccine counseling 06/10/2015  . Breast mass   . Abnormal mammogram 08/10/2012    Past Surgical History:  Procedure Laterality Date  . ABDOMINAL HYSTERECTOMY    . BREAST BIOPSY Left 01/29/2003    Fibrocystic changes, ductal adenosis, microcalcifications  . BREAST BIOPSY Left 03/29/2017   Affirm Bx- SUPERIOR, CENTRAL, POSTERIOR; STEREOTACTIC BIOPSY: ORGANIZING FAT NECROSIS  . BREAST SURGERY Left November 2004   Fibrocystic changes, ductal adenosis, microcalcifications  . COLONOSCOPY  2013   Dr Vira Agar  . COLONOSCOPY WITH PROPOFOL N/A 10/29/2017   Procedure: COLONOSCOPY WITH PROPOFOL;  Surgeon: Manya Silvas, MD;  Location: [facility] ENDOSCOPY;  Service: Endoscopy;  Laterality: N/A;  . ESOPHAGOGASTRODUODENOSCOPY    . EYE SURGERY     Cataract surgery  . FLEXIBLE SIGMOIDOSCOPY    . HERNIA REPAIR  March 2011   [facility], intrathoracic hiatal hernia  . KNEE ARTHROSCOPY Left 2014    Prior to Admission medications   Medication Sig Start Date End Date Taking? Authorizing Provider  calcium-vitamin D (OSCAL WITH D) 500-200 MG-UNIT per tablet Take 1 tablet by mouth daily.    [provider]  docusate sodium (COLACE) 100 MG capsule Take 100 mg by mouth 2 (two) times daily.    [provider]  hydrochlorothiazide (HYDRODIURIL) 12.5 MG tablet Take 12.5 mg by mouth daily.    [provider]  pantoprazole (PROTONIX) 40 MG tablet Take 40 mg by mouth daily. 04/11/18   [provider]  polyethylene glycol (MIRALAX) packet Take 17 g by mouth 2 (two) times daily. 04/21/18   Bettey Costa, MD  vitamin B-12 (CYANOCOBALAMIN) 1000 MCG tablet Take 1,000 mcg by mouth daily.    [provider]  Allergies Codeine, Lisinopril, Morphine and related, and Zoloft [sertraline hcl]  Family History  Problem Relation Age of Onset  . Prostate cancer Neg Hx   . Bladder Cancer Neg Hx   . Kidney cancer Neg Hx     Social History Social History   Tobacco Use  . Smoking status: Never Smoker  . Smokeless tobacco: Never Used  Substance Use Topics  . Alcohol use: No  . Drug use: No      Review of Systems Constitutional: No fever/chills Eyes: No visual changes. ENT:  No sore throat. Cardiovascular: Denies chest pain. Respiratory: Denies shortness of breath. Gastrointestinal: Positive abdominal pain and vomiting Genitourinary: Negative for dysuria. Musculoskeletal: Negative for back pain. Skin: Negative for rash. Neurological: Negative for headaches, focal weakness or numbness. All other ROS negative ____________________________________________   PHYSICAL EXAM:  VITAL SIGNS: Blood pressure 130/62, pulse 96, temperature 98.2 F (36.8 C), temperature source Oral, resp. rate 16, SpO2 98 %.  Constitutional: Alert and oriented. Well appearing and in no acute distress. Eyes: Conjunctivae are normal. EOMI. Head: Atraumatic. Nose: No congestion/rhinnorhea. Mouth/Throat: Mucous membranes are moist.  Some dried blood around her mouth. Neck: No stridor. Trachea Midline. FROM Cardiovascular: Normal rate, regular rhythm. Grossly normal heart sounds.  Good peripheral circulation. Respiratory: Normal respiratory effort.  No retractions. Lungs CTAB. Gastrointestinal: Tender in the lower abdomen.  No distention. No abdominal bruits.  Musculoskeletal: No lower extremity tenderness nor edema.  No joint effusions. Neurologic:  Normal speech and language. No gross focal neurologic deficits are appreciated.  Skin:  Skin is warm, dry and intact. No rash noted. Psychiatric: Mood and affect are normal. Speech and behavior are normal. GU: Deferred   ____________________________________________   LABS (all labs ordered are listed, but only abnormal results are displayed)  Labs Reviewed  CBC WITH DIFFERENTIAL/PLATELET - Abnormal; Notable for the following components:      Result Value   Lymphs Abs 0.3 (*)    Monocytes Absolute 0.0 (*)    Abs Immature Granulocytes 0.10 (*)    All other components within normal limits  COMPREHENSIVE METABOLIC PANEL - Abnormal; Notable for the following components:   Potassium 2.7 (*)    CO2 21 (*)    Glucose, Bld 168 (*)     Calcium 8.8 (*)    Total Protein 6.3 (*)    Total Bilirubin 2.2 (*)    All other components within normal limits  MAGNESIUM - Abnormal; Notable for the following components:   Magnesium 1.4 (*)    All other components within normal limits  TSH - Abnormal; Notable for the following components:   TSH 5.450 (*)    All other components within normal limits  URINE CULTURE  SARS CORONAVIRUS 2 (TAT 6-24 HRS)  LIPASE, BLOOD  PROTIME-INR  APTT  URINALYSIS, ROUTINE W REFLEX MICROSCOPIC  TYPE AND SCREEN  TROPONIN I (HIGH SENSITIVITY)  TROPONIN I (HIGH SENSITIVITY)   ____________________________________________   ED ECG REPORT I, Vanessa [place], the attending physician, personally viewed and interpreted this ECG.  EKG is normal sinus rate of 88, no ST elevations, T wave inversions in lead III, aVF, V3, right bundle branch block.  Right bundle branch block appears new from prior.  ____________________________________________  [place], personally viewed and evaluated these images (plain radiographs) as part of my medical decision making, as well as reviewing the written report by the radiologist.  ED MD interpretation: Chest x-ray no pneumonia  Official radiology report(s): Dg Chest Portable 1  View  Result Date: 02/24/2019 CLINICAL DATA:  Shortness of breath EXAM: PORTABLE CHEST 1 VIEW COMPARISON:  12/16/2015, 02/10/2008 FINDINGS: Heart size appears mildly enlarged. Atherosclerotic calcification of the aortic knob. There is a large retrocardiac hiatal hernia. No focal airspace consolidation, pleural effusion, or pneumothorax. No acute osseous findings. IMPRESSION: 1. Large hiatal hernia. 2. No acute cardiopulmonary findings. 3. Mild cardiomegaly. Electronically Signed   By: Davina Poke M.D.   On: 02/24/2019 12:49    ____________________________________________   PROCEDURES  Procedure(s) performed (including Critical Care):  Procedures    ____________________________________________   INITIAL IMPRESSION / ASSESSMENT AND PLAN / ED COURSE  SARAGRACE SPROLES was evaluated in Emergency Department on 02/24/2019 for the symptoms described in the history of present illness. She was evaluated in the context of the global COVID-19 pandemic, which necessitated consideration that the patient might be at risk for infection with the SARS-CoV-2 virus that causes COVID-19. Institutional protocols and algorithms that pertain to the evaluation of patients at risk for COVID-19 are in a state of rapid change based on information released by regulatory bodies including the CDC and federal and state organizations. These policies and algorithms were followed during the patient's care in the ED.    Patient presents with abdominal pain, vomiting and LOC requiring CPR for 15 to 20 seconds.  Will get EKG to evaluate for arrhythmia and keep on the cardiac monitor.  Will get CT PE to evaluate for pulmonary embolism given she was coughing up or throwing up blood.  Will get CT abdomen to evaluate for abdominal infection.  Will get labs to evaluate for anemia, electrolyte abnormalities, AKI.  Rectal exam showed brown stool and Hemoccult negative.  Hemoglobin was also normal therefore think this is less likely secondary to a GI bleed.  Troponin was only 11 but will need to continue to trend these out.  Potassium was 2.7.  Will give 30 of IV K.  Will add on magnesium.  Dr Diamantina Providence from urology aware of pt and will come see her.   Per Urology- Looks like that stone is causing some obstruction of the upper pole and is probably the cause of her pain and n/V. Plan for ureteroscopy if cardiac workup today negative  D/w medicine and they will admit patient.    ____________________________________________   FINAL CLINICAL IMPRESSION(S) / ED DIAGNOSES   Final diagnoses:  Syncope and collapse  Hypokalemia  Hypomagnesemia  Kidney stone on right side       MEDICATIONS GIVEN DURING THIS VISIT:  Medications  pantoprazole (PROTONIX) 80 mg in sodium chloride 0.9 % 100 mL IVPB (has no administration in time range)  potassium chloride 10 mEq in 100 mL IVPB (has no administration in time range)  magnesium sulfate IVPB 2 g 50 mL (has no administration in time range)  potassium chloride SA (KLOR-CON) CR tablet 40 mEq (has no administration in time range)  0.9 %  sodium chloride infusion (has no administration in time range)  tamsulosin (FLOMAX) capsule 0.4 mg (has no administration in time range)  enoxaparin (LOVENOX) injection 40 mg (has no administration in time range)  acetaminophen (TYLENOL) tablet 650 mg (has no administration in time range)    Or  acetaminophen (TYLENOL) suppository 650 mg (has no administration in time range)  senna-docusate (Senokot-S) tablet 1 tablet (has no administration in time range)  ondansetron (ZOFRAN) tablet 4 mg (has no administration in time range)    Or  ondansetron (ZOFRAN) injection 4 mg (has no administration in  time range)  metoCLOPramide (REGLAN) injection 10 mg (10 mg Intravenous Given 02/24/19 1242)  iohexol (OMNIPAQUE) 350 MG/ML injection 75 mL (75 mLs Intravenous Contrast Given 02/24/19 1403)     ED Discharge Orders    None       Note:  This document was prepared using Dragon voice recognition software and may include unintentional dictation errors.   Vanessa [place], MD 02/24/19 1534

## 2019-02-24 NOTE — ED Triage Notes (Signed)
Pt via EMS from home c/o R sided pain that radiates to her abd. Pt c/o of NV. Pt also states that she had blood in her urine for about a couple months. Pt also has blood in vomit. Pt was given 8 of Zofran en route.   While EMS was en route, pt was asystole on the monitor and didn't have pulse. EMS began CPR and pulses returned after approx 10-15 seconds.

## 2019-02-24 NOTE — Progress Notes (Signed)
Nurse notified me Patient once again went into asystole this time lasting for almost a minute. ER provider went to the bedside and evaluated the patient. Brief code less than a min.   Now she is awake again. Vitals are stable.   Will aggressively replete lytes and notify cardiology.   Ankit Amin TRH

## 2019-02-24 NOTE — Consult Note (Signed)
Ashley Tanner is a 78 y.o. female  [id number]  Primary Cardiologist: Neoma Laming Reason for Consultation: Preoperative clearance  HPI: This is a 78 year old white female who has no past cardiac problems presented to the hospital with abdominal pain and nausea and vomiting and was found to have kidney stones.  Patient apparently had undocumented 22 seconds asystole and I was asked to evaluate the patient prior to having cystoscopy tomorrow.   Review of Systems: Patient denies any chest pain or shortness of breath and is only complaining of nausea vomiting and abdominal pain.   Past Medical History:  Diagnosis Date  . Arthritis   . B12 deficiency   . Chronic diarrhea   . Diabetes mellitus without complication ([place])   . GERD (gastroesophageal reflux disease)   . History of colon polyps   . History of hiatal hernia   . History of supraventricular tachycardia   . Hoarseness of voice   . Hypertension   . Kidney stone   . Osteoarthritis   . Other constipation   . Peripheral neuropathy   . Psoriasis     (Not in a hospital admission)    . enoxaparin (LOVENOX) injection  40 mg Subcutaneous Q24H  . potassium chloride  40 mEq Oral Q4H  . tamsulosin  0.4 mg Oral QPC supper    Infusions: . sodium chloride    . cefTRIAXone (ROCEPHIN)  IV    . potassium chloride 10 mEq (02/24/19 1643)    Allergies  Allergen Reactions  . Codeine Nausea Only    Nausea and dizziness  . Lisinopril Other (See Comments)    Weakness and dizziness  . Morphine And Related Nausea And Vomiting  . Zoloft [Sertraline Hcl] Other (See Comments)    Dizziness    Social History   Socioeconomic History  . Marital status: Widowed    Spouse name: Not on file  . Number of children: Not on file  . Years of education: Not on file  . Highest education level: Not on file  Occupational History  . Not on file  Social Needs  . Financial resource strain: Not on file  . Food insecurity    Worry: Not on  file    Inability: Not on file  . Transportation needs    Medical: Not on file    Non-medical: Not on file  Tobacco Use  . Smoking status: Never Smoker  . Smokeless tobacco: Never Used  Substance and Sexual Activity  . Alcohol use: No  . Drug use: No  . Sexual activity: Not Currently    Birth control/protection: Post-menopausal    Comment: Married   Lifestyle  . Physical activity    Days per week: Not on file    Minutes per session: Not on file  . Stress: Not on file  Relationships  . Social Herbalist on phone: Not on file    Gets together: Not on file    Attends religious service: Not on file    Active member of club or organization: Not on file    Attends meetings of clubs or organizations: Not on file    Relationship status: Not on file  . Intimate partner violence    Fear of current or ex partner: Not on file    Emotionally abused: Not on file    Physically abused: Not on file    Forced sexual activity: Not on file  Other Topics Concern  . Not on file  Social  History Narrative  . Not on file    Family History  Problem Relation Age of Onset  . Prostate cancer Neg Hx   . Bladder Cancer Neg Hx   . Kidney cancer Neg Hx     PHYSICAL EXAM: Vitals:   02/24/19 1330 02/24/19 1630  BP: 130/62 113/65  Pulse: 96 91  Resp: 16 19  Temp:    SpO2: 98% 96%     Intake/Output Summary (Last 24 hours) at 02/24/2019 1729 Last data filed at 02/24/2019 1638 Gross per 24 hour  Intake 300 ml  Output -  Net 300 ml    General:  Well appearing. No respiratory difficulty HEENT: normal Neck: supple. no JVD. Carotids 2+ bilat; no bruits. No lymphadenopathy or thryomegaly appreciated. Cor: PMI nondisplaced. Regular rate & rhythm. No rubs, gallops or murmurs. Lungs: clear Abdomen: soft, nontender, nondistended. No hepatosplenomegaly. No bruits or masses. Good bowel sounds. Extremities: no cyanosis, clubbing, rash, edema Neuro: alert & oriented x 3, cranial nerves  grossly intact. moves all 4 extremities w/o difficulty. Affect pleasant.  ECG: Normal sinus rhythm with right bundle branch block and nonspecific ST-T changes  Results for orders placed or performed during the hospital encounter of 02/24/19 (from the past 24 hour(s))  CBC with Differential     Status: Abnormal   Collection Time: 02/24/19 12:32 PM  Result Value Ref Range   WBC 7.6 4.0 - 10.5 K/uL   RBC 4.05 3.87 - 5.11 MIL/uL   Hemoglobin 12.7 12.0 - 15.0 g/dL   HCT 36.7 36.0 - 46.0 %   MCV 90.6 80.0 - 100.0 fL   MCH 31.4 26.0 - 34.0 pg   MCHC 34.6 30.0 - 36.0 g/dL   RDW 12.4 11.5 - 15.5 %   Platelets 233 150 - 400 K/uL   nRBC 0.0 0.0 - 0.2 %   Neutrophils Relative % 95 %   Neutro Abs 7.1 1.7 - 7.7 K/uL   Lymphocytes Relative 4 %   Lymphs Abs 0.3 (L) 0.7 - 4.0 K/uL   Monocytes Relative 0 %   Monocytes Absolute 0.0 (L) 0.1 - 1.0 K/uL   Eosinophils Relative 0 %   Eosinophils Absolute 0.0 0.0 - 0.5 K/uL   Basophils Relative 0 %   Basophils Absolute 0.0 0.0 - 0.1 K/uL   Immature Granulocytes 1 %   Abs Immature Granulocytes 0.10 (H) 0.00 - 0.07 K/uL  Comprehensive metabolic panel     Status: Abnormal   Collection Time: 02/24/19 12:32 PM  Result Value Ref Range   Sodium 137 135 - 145 mmol/L   Potassium 2.7 (LL) 3.5 - 5.1 mmol/L   Chloride 105 98 - 111 mmol/L   CO2 21 (L) 22 - 32 mmol/L   Glucose, Bld 168 (H) 70 - 99 mg/dL   BUN 18 8 - 23 mg/dL   Creatinine, Ser 0.87 0.44 - 1.00 mg/dL   Calcium 8.8 (L) 8.9 - 10.3 mg/dL   Total Protein 6.3 (L) 6.5 - 8.1 g/dL   Albumin 3.5 3.5 - 5.0 g/dL   AST 18 15 - 41 U/L   ALT 11 0 - 44 U/L   Alkaline Phosphatase 70 38 - 126 U/L   Total Bilirubin 2.2 (H) 0.3 - 1.2 mg/dL   GFR calc non Af Amer >60 >60 mL/min   GFR calc Af Amer >60 >60 mL/min   Anion gap 11 5 - 15  Lipase, blood     Status: None   Collection Time: 02/24/19 12:32 PM  Result Value Ref Range   Lipase 22 11 - 51 U/L  Protime-INR     Status: None   Collection Time: 02/24/19  12:32 PM  Result Value Ref Range   Prothrombin Time 13.1 11.4 - 15.2 seconds   INR 1.0 0.8 - 1.2  APTT     Status: None   Collection Time: 02/24/19 12:32 PM  Result Value Ref Range   aPTT 25 24 - 36 seconds  Troponin I (High Sensitivity)     Status: None   Collection Time: 02/24/19 12:32 PM  Result Value Ref Range   Troponin I (High Sensitivity) 11 <18 ng/L  TSH     Status: Abnormal   Collection Time: 02/24/19 12:32 PM  Result Value Ref Range   TSH 5.450 (H) 0.350 - 4.500 uIU/mL  Type and screen [place]     Status: None   Collection Time: 02/24/19 12:36 PM  Result Value Ref Range   ABO/RH(D) O NEG    Antibody Screen NEG    Sample Expiration      02/27/2019,2359 Performed at [facility], [address] [place], [place] [address]   Magnesium     Status: Abnormal   Collection Time: 02/24/19  1:54 PM  Result Value Ref Range   Magnesium 1.4 (L) 1.7 - 2.4 mg/dL  Urinalysis, Routine w reflex microscopic     Status: Abnormal   Collection Time: 02/24/19  3:25 PM  Result Value Ref Range   Color, Urine YELLOW (A) YELLOW   APPearance CLEAR (A) CLEAR   Specific Gravity, Urine 1.031 (H) 1.005 - 1.030   pH 5.0 5.0 - 8.0   Glucose, UA NEGATIVE NEGATIVE mg/dL   Hgb urine dipstick NEGATIVE NEGATIVE   Bilirubin Urine NEGATIVE NEGATIVE   Ketones, ur 5 (A) NEGATIVE mg/dL   Protein, ur NEGATIVE NEGATIVE mg/dL   Nitrite POSITIVE (A) NEGATIVE   Leukocytes,Ua TRACE (A) NEGATIVE   RBC / HPF 0-5 0 - 5 RBC/hpf   WBC, UA 11-20 0 - 5 WBC/hpf   Bacteria, UA FEW (A) NONE SEEN   Squamous Epithelial / LPF 0-5 0 - 5   Mucus PRESENT    Hyaline Casts, UA PRESENT   Troponin I (High Sensitivity)     Status: None   Collection Time: 02/24/19  3:25 PM  Result Value Ref Range   Troponin I (High Sensitivity) 11 <18 ng/L   Ct Head Wo Contrast  Result Date: 02/24/2019 CLINICAL DATA:  Right-sided pain. Blood in urine. Blood in vomit. Altered consciousness. EXAM: CT HEAD  WITHOUT CONTRAST TECHNIQUE: Contiguous axial images were obtained from the base of the skull through the vertex without intravenous contrast. COMPARISON:  None. FINDINGS: Brain: No subdural, epidural, or subarachnoid hemorrhage. Brainstem, basal cisterns, and cerebellum are normal. Ventricles and sulci are unremarkable. No mass effect or midline shift. No acute cortical ischemia or infarct. Scattered mild white matter changes are identified. Vascular: No hyperdense vessel or unexpected calcification. Skull: Normal. Negative for fracture or focal lesion. Sinuses/Orbits: No acute finding. Other: None. IMPRESSION: 1. No acute intracranial abnormalities identified. Scattered white matter changes. Electronically Signed   By: Dorise Bullion III M.D   On: 02/24/2019 14:32   Ct Angio Chest Pe W And/or Wo Contrast  Result Date: 02/24/2019 CLINICAL DATA:  Acute generalized abdominal pain. Chest pain. EXAM: CT ANGIOGRAPHY CHEST CT ABDOMEN AND PELVIS WITH CONTRAST TECHNIQUE: Multidetector CT imaging of the chest was performed using the standard protocol during bolus administration of intravenous  contrast. Multiplanar CT image reconstructions and MIPs were obtained to evaluate the vascular anatomy. Multidetector CT imaging of the abdomen and pelvis was performed using the standard protocol during bolus administration of intravenous contrast. CONTRAST:  38mL OMNIPAQUE IOHEXOL 350 MG/ML SOLN COMPARISON:  April 19, 2018. February 10, 2008. FINDINGS: CTA CHEST FINDINGS Cardiovascular: Satisfactory opacification of the pulmonary arteries to the segmental level. No evidence of pulmonary embolism. Normal heart size. No pericardial effusion. Atherosclerosis of thoracic aorta is noted without aneurysm or dissection. Coronary artery calcifications are noted. Mediastinum/Nodes: Large sliding-type hiatal hernia is noted. Thyroid gland is unremarkable. No adenopathy is noted. Lungs/Pleura: Lungs are clear. No pleural effusion or  pneumothorax. Musculoskeletal: No chest wall abnormality. No acute or significant osseous findings. Review of the MIP images confirms the above findings. CT ABDOMEN and PELVIS FINDINGS Hepatobiliary: Probable gallstones are noted. No gallbladder wall thickening or biliary dilatation is noted. The liver is unremarkable. Pancreas: Unremarkable. No pancreatic ductal dilatation or surrounding inflammatory changes. Spleen: Small splenic cyst is noted. No other abnormality is noted. Adrenals/Urinary Tract: Adrenal glands appear normal. Left kidney and ureter are unremarkable. Urinary bladder is unremarkable. 16 mm calculus is seen in upper pole of right kidney which appears to be within the infundibulum in resulting in mild upper pole caliectasis. Stomach/Bowel: Stomach is within normal limits. Appendix appears normal. No evidence of bowel wall thickening, distention, or inflammatory changes. Sigmoid diverticulosis is noted without inflammation. Vascular/Lymphatic: Aortic atherosclerosis. No enlarged abdominal or pelvic lymph nodes. Reproductive: Status post hysterectomy. No adnexal masses. Other: No abdominal wall hernia or abnormality. No abdominopelvic ascites. Musculoskeletal: No acute or significant osseous findings. Review of the MIP images confirms the above findings. IMPRESSION: 1. No definite evidence of pulmonary embolus. 2. Coronary artery calcifications are noted suggesting coronary artery disease. 3. Large sliding-type hiatal hernia is noted. 4. Probable cholelithiasis without inflammation. 5. 16 mm calculus is seen in upper pole of right kidney which appears to be within the infundibulum in resulting in mild upper pole caliectasis and obstruction. 6. Sigmoid diverticulosis is noted without inflammation. 7. Aortic atherosclerosis. Aortic Atherosclerosis (ICD10-I70.0). Electronically Signed   By: Marijo Conception M.D.   On: 02/24/2019 14:40   Ct Abdomen Pelvis W Contrast  Result Date: 02/24/2019 CLINICAL  DATA:  Acute generalized abdominal pain. Chest pain. EXAM: CT ANGIOGRAPHY CHEST CT ABDOMEN AND PELVIS WITH CONTRAST TECHNIQUE: Multidetector CT imaging of the chest was performed using the standard protocol during bolus administration of intravenous contrast. Multiplanar CT image reconstructions and MIPs were obtained to evaluate the vascular anatomy. Multidetector CT imaging of the abdomen and pelvis was performed using the standard protocol during bolus administration of intravenous contrast. CONTRAST:  36mL OMNIPAQUE IOHEXOL 350 MG/ML SOLN COMPARISON:  April 19, 2018. February 10, 2008. FINDINGS: CTA CHEST FINDINGS Cardiovascular: Satisfactory opacification of the pulmonary arteries to the segmental level. No evidence of pulmonary embolism. Normal heart size. No pericardial effusion. Atherosclerosis of thoracic aorta is noted without aneurysm or dissection. Coronary artery calcifications are noted. Mediastinum/Nodes: Large sliding-type hiatal hernia is noted. Thyroid gland is unremarkable. No adenopathy is noted. Lungs/Pleura: Lungs are clear. No pleural effusion or pneumothorax. Musculoskeletal: No chest wall abnormality. No acute or significant osseous findings. Review of the MIP images confirms the above findings. CT ABDOMEN and PELVIS FINDINGS Hepatobiliary: Probable gallstones are noted. No gallbladder wall thickening or biliary dilatation is noted. The liver is unremarkable. Pancreas: Unremarkable. No pancreatic ductal dilatation or surrounding inflammatory changes. Spleen: Small splenic cyst is noted. No other  abnormality is noted. Adrenals/Urinary Tract: Adrenal glands appear normal. Left kidney and ureter are unremarkable. Urinary bladder is unremarkable. 16 mm calculus is seen in upper pole of right kidney which appears to be within the infundibulum in resulting in mild upper pole caliectasis. Stomach/Bowel: Stomach is within normal limits. Appendix appears normal. No evidence of bowel wall  thickening, distention, or inflammatory changes. Sigmoid diverticulosis is noted without inflammation. Vascular/Lymphatic: Aortic atherosclerosis. No enlarged abdominal or pelvic lymph nodes. Reproductive: Status post hysterectomy. No adnexal masses. Other: No abdominal wall hernia or abnormality. No abdominopelvic ascites. Musculoskeletal: No acute or significant osseous findings. Review of the MIP images confirms the above findings. IMPRESSION: 1. No definite evidence of pulmonary embolus. 2. Coronary artery calcifications are noted suggesting coronary artery disease. 3. Large sliding-type hiatal hernia is noted. 4. Probable cholelithiasis without inflammation. 5. 16 mm calculus is seen in upper pole of right kidney which appears to be within the infundibulum in resulting in mild upper pole caliectasis and obstruction. 6. Sigmoid diverticulosis is noted without inflammation. 7. Aortic atherosclerosis. Aortic Atherosclerosis (ICD10-I70.0). Electronically Signed   By: Marijo Conception M.D.   On: 02/24/2019 14:40   Dg Chest Portable 1 View  Result Date: 02/24/2019 CLINICAL DATA:  Shortness of breath EXAM: PORTABLE CHEST 1 VIEW COMPARISON:  12/16/2015, 02/10/2008 FINDINGS: Heart size appears mildly enlarged. Atherosclerotic calcification of the aortic knob. There is a large retrocardiac hiatal hernia. No focal airspace consolidation, pleural effusion, or pneumothorax. No acute osseous findings. IMPRESSION: 1. Large hiatal hernia. 2. No acute cardiopulmonary findings. 3. Mild cardiomegaly. Electronically Signed   By: Davina Poke M.D.   On: 02/24/2019 12:49     ASSESSMENT AND PLAN: Asystole secondary to electrolyte disturbance with potassium being 2.7 and magnesium 1.4.  Advise giving 2 g of magnesium IV x1 and also giving total of total of 100 mEq of KCl IV since patient cannot take by mouth to bring the potassium level to 4 and magnesium level to 2.  After that proceed with surgery /cystoscopy.  Patient  is low risk for this procedure and the procedure itself is considered low risk with no prior history of cardiac disease.  However ideally electrolytes should be corrected as much as possible prior to the procedure. KHAN,SHAUKAT A

## 2019-02-24 NOTE — ED Notes (Signed)
Date and time results received: 02/24/19 1340  Test: Potassium  Critical Value: 2.7  Name of Provider Notified: Dr. Jari Pigg, MD

## 2019-02-24 NOTE — ED Notes (Signed)
To room to check on pt who stated she felt sick and like she was going to pass out.  Pt was pale and diaphoretic.  Pt became unresponsive and noted what appeared to be asystole on the monitor.  CPR started, called for assistance.  Pt regained consciousness and noted ST on monitor within approximately 45-90 secs.  Dr. Kerman Passey to bedside, Dr. Reesa Chew notified.  Will continue to monitor pt closely at this time.

## 2019-02-24 NOTE — ED Notes (Signed)
Pt c/o being cold and nauseous.  Zofran ordered, will administer.

## 2019-02-24 NOTE — Consult Note (Signed)
   02/24/19 5:53 PM   Wayne Both 19-Mar-1941 [id number]   CC: Abdominal Pain  HPI: I was asked to see Ms. Gruner in consultation for abdominal pain from Dr. Jari Pigg.  She is a 78 year old female I have followed as an outpatient for a 1.5 cm right renal stone.  She has refused intervention multiple times over the last 18 month as she was asymptomatic.  She presents today with 2 days of abdominal pain, nausea, and gross hematuria.  CT showed likely partial obstruction from the 1.5 cm upper pole stone with some dilation of the upper pole calyx.  She also reportedly had a 20 second episode of asystole with EMS.  Electrolytes were abnormal with low magnesium and potassium.  Cardiology has been consulted.  She reportedly had another 1 minute episode of asystole this evening.  She is afebrile with no leukocytosis, urinalysis notable for few bacteria, 11-20 WBCs, trace leukocytes, nitrite positive.  Suspect the nitrite positivity may be secondary to gross hematuria.  -Agree with ongoing cardiology work-up of multiple episodes of asystole  -Tentatively plan for right ureteroscopy, laser lithotripsy, stent placement tomorrow if cleared from a cardiology perspective -With her upper pole stone location, highly doubt I would be able to place a ureteral stent for drainage without performing ureteroscopy to laser at least some of the obstructing stone.  Will need a staged procedure if any intraoperative signs of infection or fevers.  Full consult note to follow   [place], [place] [address] [place], [place] [address] [phone]

## 2019-02-24 NOTE — H&P (Signed)
History and Physical    Ashley Tanner [id number] DOB: 08-Mar-1941 DOA: 02/24/2019  PCP: Dion Body, MD Patient coming from: Home  Chief Complaint: Nausea vomiting  HPI: Ashley Tanner is a 78 y.o. female with medical history significant of diet-controlled DM 2, essential hypertension, renal stones presented to the hospital with complaints of nausea and vomiting.  Patient states she has been feeling right-sided abdominal pain for the past 2 days along with nausea and vomiting.  This became severe today therefore she called EMS.  Along route to the hospital, she became unconscious and thought she was in asystole, about 20 seconds CPR was performed. She has been stable since she has been brought to the ER but does report of off-and-on right-sided abdominal pain.  CT of the abdomen pelvis showed 57mm renal stone, partially obstructing.  Urology consulted who plans on performing cystoscopy once cleared from cardiac standpoint.  When I saw her at bedside, her niece was present as well.  Patient denied any complaints and did not have any similar issues in the past.  She has dealt with renal stones in the past but this was the most severe.   Review of Systems: As per HPI otherwise 10 point review of systems negative.  Review of Systems Otherwise negative except as per HPI, including: General: Denies fever, chills, night sweats or unintended weight loss. Resp: Denies cough, wheezing, shortness of breath. Cardiac: Denies chest pain, palpitations, orthopnea, paroxysmal nocturnal dyspnea. GI: Deniesdiarrhea or constipation GU: Denies dysuria, frequency, hesitancy or incontinence MS: Denies muscle aches, joint pain or swelling Neuro: Denies headache, neurologic deficits (focal weakness, numbness, tingling), abnormal gait Psych: Denies anxiety, depression, SI/HI/AVH Skin: Denies new rashes or lesions ID: Denies sick contacts, exotic exposures, travel  Past Medical History:    Diagnosis Date   Arthritis    B12 deficiency    Chronic diarrhea    Diabetes mellitus without complication (HCC)    GERD (gastroesophageal reflux disease)    History of colon polyps    History of hiatal hernia    History of supraventricular tachycardia    Hoarseness of voice    Hypertension    Kidney stone    Osteoarthritis    Other constipation    Peripheral neuropathy    Psoriasis     Past Surgical History:  Procedure Laterality Date   ABDOMINAL HYSTERECTOMY     BREAST BIOPSY Left 01/29/2003   Fibrocystic changes, ductal adenosis, microcalcifications   BREAST BIOPSY Left 03/29/2017   Affirm Bx- SUPERIOR, CENTRAL, POSTERIOR; STEREOTACTIC BIOPSY: ORGANIZING FAT NECROSIS   BREAST SURGERY Left November 2004   Fibrocystic changes, ductal adenosis, microcalcifications   COLONOSCOPY  2013   Dr Vira Agar   COLONOSCOPY WITH PROPOFOL N/A 10/29/2017   Procedure: COLONOSCOPY WITH PROPOFOL;  Surgeon: Manya Silvas, MD;  Location: [facility] ENDOSCOPY;  Service: Endoscopy;  Laterality: N/A;   ESOPHAGOGASTRODUODENOSCOPY     EYE SURGERY     Cataract surgery   FLEXIBLE SIGMOIDOSCOPY     HERNIA REPAIR  March 2011   [facility], intrathoracic hiatal hernia   KNEE ARTHROSCOPY Left 2014    SOCIAL HISTORY:  reports that she has never smoked. She has never used smokeless tobacco. She reports that she does not drink alcohol or use drugs.  Allergies  Allergen Reactions   Codeine Nausea Only    Nausea and dizziness   Lisinopril Other (See Comments)    Weakness and dizziness   Morphine And Related Nausea And Vomiting   Zoloft [Sertraline Hcl]  Other (See Comments)    Dizziness    FAMILY HISTORY: Family History  Problem Relation Age of Onset   Prostate cancer Neg Hx    Bladder Cancer Neg Hx    Kidney cancer Neg Hx      Prior to Admission medications   Medication Sig Start Date End Date Taking? Authorizing Provider  calcium-vitamin D (OSCAL WITH D) 500-200  MG-UNIT per tablet Take 1 tablet by mouth daily.   Yes [provider]  hydrochlorothiazide (HYDRODIURIL) 12.5 MG tablet Take 12.5 mg by mouth daily.   Yes [provider]  pantoprazole (PROTONIX) 40 MG tablet Take 40 mg by mouth daily. 04/11/18  Yes [provider]  polyethylene glycol (MIRALAX) packet Take 17 g by mouth 2 (two) times daily. 04/21/18  Yes Mody, Ulice Bold, MD  vitamin B-12 (CYANOCOBALAMIN) 1000 MCG tablet Take 1,000 mcg by mouth daily.   Yes [provider]  docusate sodium (COLACE) 100 MG capsule Take 100 mg by mouth 2 (two) times daily.    [provider]    Physical Exam: Vitals:   02/24/19 1230 02/24/19 1235 02/24/19 1300 02/24/19 1330  BP: 131/66 131/66 125/61 130/62  Pulse: 87 85 91 96  Resp: 20 20 (!) 24 16  Temp:  98.2 F (36.8 C)    TempSrc:  Oral    SpO2: 93% 94% 96% 98%      Constitutional: NAD, calm, comfortable Eyes: PERRL, lids and conjunctivae normal ENMT: Mucous membranes are moist. Posterior pharynx clear of any exudate or lesions.Normal dentition.  Neck: normal, supple, no masses, no thyromegaly Respiratory: clear to auscultation bilaterally, no wheezing, no crackles. Normal respiratory effort. No accessory muscle use.  Cardiovascular: Regular rate and rhythm, no murmurs / rubs / gallops. No extremity edema. 2+ pedal pulses. No carotid bruits.  Abdomen: no tenderness, no masses palpated. No hepatosplenomegaly. Bowel sounds positive.  Musculoskeletal: no clubbing / cyanosis. No joint deformity upper and lower extremities. Good ROM, no contractures. Normal muscle tone.  Skin: no rashes, lesions, ulcers. No induration Neurologic: CN 2-12 grossly intact. Sensation intact, DTR normal. Strength 5/5 in all 4.  Psychiatric: Normal judgment and insight. Alert and oriented x 3. Normal mood.     Labs on Admission: I have personally reviewed following labs and imaging studies  CBC: Recent Labs  Lab 02/24/19 1232    WBC 7.6  NEUTROABS 7.1  HGB 12.7  HCT 36.7  MCV 90.6  PLT 0000000   Basic Metabolic Panel: Recent Labs  Lab 02/24/19 1232 02/24/19 1354  NA 137  --   K 2.7*  --   CL 105  --   CO2 21*  --   GLUCOSE 168*  --   BUN 18  --   CREATININE 0.87  --   CALCIUM 8.8*  --   MG  --  1.4*   GFR: CrCl cannot be calculated (Unknown ideal weight.). Liver Function Tests: Recent Labs  Lab 02/24/19 1232  AST 18  ALT 11  ALKPHOS 70  BILITOT 2.2*  PROT 6.3*  ALBUMIN 3.5   Recent Labs  Lab 02/24/19 1232  LIPASE 22   No results for input(s): AMMONIA in the last 168 hours. Coagulation Profile: Recent Labs  Lab 02/24/19 1232  INR 1.0   Cardiac Enzymes: No results for input(s): CKTOTAL, CKMB, CKMBINDEX, TROPONINI in the last 168 hours. BNP (last 3 results) No results for input(s): PROBNP in the last 8760 hours. HbA1C: No results for input(s): HGBA1C in the last 72  hours. CBG: No results for input(s): GLUCAP in the last 168 hours. Lipid Profile: No results for input(s): CHOL, HDL, LDLCALC, TRIG, CHOLHDL, LDLDIRECT in the last 72 hours. Thyroid Function Tests: Recent Labs    02/24/19 1232  TSH 5.450*   Anemia Panel: No results for input(s): VITAMINB12, FOLATE, FERRITIN, TIBC, IRON, RETICCTPCT in the last 72 hours. Urine analysis:    Component Value Date/Time   COLORURINE YELLOW (A) 02/24/2019 1525   APPEARANCEUR CLEAR (A) 02/24/2019 1525   APPEARANCEUR Cloudy (A) 02/06/2019 1346   LABSPEC 1.031 (H) 02/24/2019 1525   LABSPEC 1.016 03/11/2014 2002   PHURINE 5.0 02/24/2019 1525   GLUCOSEU NEGATIVE 02/24/2019 1525   GLUCOSEU 50 mg/dL 03/11/2014 2002   HGBUR NEGATIVE 02/24/2019 1525   BILIRUBINUR NEGATIVE 02/24/2019 1525   BILIRUBINUR Negative 02/06/2019 1346   BILIRUBINUR Negative 03/11/2014 2002   KETONESUR 5 (A) 02/24/2019 1525   PROTEINUR NEGATIVE 02/24/2019 1525   NITRITE POSITIVE (A) 02/24/2019 1525   LEUKOCYTESUR TRACE (A) 02/24/2019 1525   LEUKOCYTESUR 1+  03/11/2014 2002   Sepsis Labs: !!!!!!!!!!!!!!!!!!!!!!!!!!!!!!!!!!!!!!!!!!!! @LABRCNTIP (procalcitonin:4,lacticidven:4) )No results found for this or any previous visit (from the past 240 hour(s)).   Radiological Exams on Admission: Ct Head Wo Contrast  Result Date: 02/24/2019 CLINICAL DATA:  Right-sided pain. Blood in urine. Blood in vomit. Altered consciousness. EXAM: CT HEAD WITHOUT CONTRAST TECHNIQUE: Contiguous axial images were obtained from the base of the skull through the vertex without intravenous contrast. COMPARISON:  None. FINDINGS: Brain: No subdural, epidural, or subarachnoid hemorrhage. Brainstem, basal cisterns, and cerebellum are normal. Ventricles and sulci are unremarkable. No mass effect or midline shift. No acute cortical ischemia or infarct. Scattered mild white matter changes are identified. Vascular: No hyperdense vessel or unexpected calcification. Skull: Normal. Negative for fracture or focal lesion. Sinuses/Orbits: No acute finding. Other: None. IMPRESSION: 1. No acute intracranial abnormalities identified. Scattered white matter changes. Electronically Signed   By: Dorise Bullion III M.D   On: 02/24/2019 14:32   Ct Angio Chest Pe W And/or Wo Contrast  Result Date: 02/24/2019 CLINICAL DATA:  Acute generalized abdominal pain. Chest pain. EXAM: CT ANGIOGRAPHY CHEST CT ABDOMEN AND PELVIS WITH CONTRAST TECHNIQUE: Multidetector CT imaging of the chest was performed using the standard protocol during bolus administration of intravenous contrast. Multiplanar CT image reconstructions and MIPs were obtained to evaluate the vascular anatomy. Multidetector CT imaging of the abdomen and pelvis was performed using the standard protocol during bolus administration of intravenous contrast. CONTRAST:  31mL OMNIPAQUE IOHEXOL 350 MG/ML SOLN COMPARISON:  April 19, 2018. February 10, 2008. FINDINGS: CTA CHEST FINDINGS Cardiovascular: Satisfactory opacification of the pulmonary arteries to the  segmental level. No evidence of pulmonary embolism. Normal heart size. No pericardial effusion. Atherosclerosis of thoracic aorta is noted without aneurysm or dissection. Coronary artery calcifications are noted. Mediastinum/Nodes: Large sliding-type hiatal hernia is noted. Thyroid gland is unremarkable. No adenopathy is noted. Lungs/Pleura: Lungs are clear. No pleural effusion or pneumothorax. Musculoskeletal: No chest wall abnormality. No acute or significant osseous findings. Review of the MIP images confirms the above findings. CT ABDOMEN and PELVIS FINDINGS Hepatobiliary: Probable gallstones are noted. No gallbladder wall thickening or biliary dilatation is noted. The liver is unremarkable. Pancreas: Unremarkable. No pancreatic ductal dilatation or surrounding inflammatory changes. Spleen: Small splenic cyst is noted. No other abnormality is noted. Adrenals/Urinary Tract: Adrenal glands appear normal. Left kidney and ureter are unremarkable. Urinary bladder is unremarkable. 16 mm calculus is seen in upper pole of right kidney which appears to  be within the infundibulum in resulting in mild upper pole caliectasis. Stomach/Bowel: Stomach is within normal limits. Appendix appears normal. No evidence of bowel wall thickening, distention, or inflammatory changes. Sigmoid diverticulosis is noted without inflammation. Vascular/Lymphatic: Aortic atherosclerosis. No enlarged abdominal or pelvic lymph nodes. Reproductive: Status post hysterectomy. No adnexal masses. Other: No abdominal wall hernia or abnormality. No abdominopelvic ascites. Musculoskeletal: No acute or significant osseous findings. Review of the MIP images confirms the above findings. IMPRESSION: 1. No definite evidence of pulmonary embolus. 2. Coronary artery calcifications are noted suggesting coronary artery disease. 3. Large sliding-type hiatal hernia is noted. 4. Probable cholelithiasis without inflammation. 5. 16 mm calculus is seen in upper pole of  right kidney which appears to be within the infundibulum in resulting in mild upper pole caliectasis and obstruction. 6. Sigmoid diverticulosis is noted without inflammation. 7. Aortic atherosclerosis. Aortic Atherosclerosis (ICD10-I70.0). Electronically Signed   By: Marijo Conception M.D.   On: 02/24/2019 14:40   Ct Abdomen Pelvis W Contrast  Result Date: 02/24/2019 CLINICAL DATA:  Acute generalized abdominal pain. Chest pain. EXAM: CT ANGIOGRAPHY CHEST CT ABDOMEN AND PELVIS WITH CONTRAST TECHNIQUE: Multidetector CT imaging of the chest was performed using the standard protocol during bolus administration of intravenous contrast. Multiplanar CT image reconstructions and MIPs were obtained to evaluate the vascular anatomy. Multidetector CT imaging of the abdomen and pelvis was performed using the standard protocol during bolus administration of intravenous contrast. CONTRAST:  88mL OMNIPAQUE IOHEXOL 350 MG/ML SOLN COMPARISON:  April 19, 2018. February 10, 2008. FINDINGS: CTA CHEST FINDINGS Cardiovascular: Satisfactory opacification of the pulmonary arteries to the segmental level. No evidence of pulmonary embolism. Normal heart size. No pericardial effusion. Atherosclerosis of thoracic aorta is noted without aneurysm or dissection. Coronary artery calcifications are noted. Mediastinum/Nodes: Large sliding-type hiatal hernia is noted. Thyroid gland is unremarkable. No adenopathy is noted. Lungs/Pleura: Lungs are clear. No pleural effusion or pneumothorax. Musculoskeletal: No chest wall abnormality. No acute or significant osseous findings. Review of the MIP images confirms the above findings. CT ABDOMEN and PELVIS FINDINGS Hepatobiliary: Probable gallstones are noted. No gallbladder wall thickening or biliary dilatation is noted. The liver is unremarkable. Pancreas: Unremarkable. No pancreatic ductal dilatation or surrounding inflammatory changes. Spleen: Small splenic cyst is noted. No other abnormality is  noted. Adrenals/Urinary Tract: Adrenal glands appear normal. Left kidney and ureter are unremarkable. Urinary bladder is unremarkable. 16 mm calculus is seen in upper pole of right kidney which appears to be within the infundibulum in resulting in mild upper pole caliectasis. Stomach/Bowel: Stomach is within normal limits. Appendix appears normal. No evidence of bowel wall thickening, distention, or inflammatory changes. Sigmoid diverticulosis is noted without inflammation. Vascular/Lymphatic: Aortic atherosclerosis. No enlarged abdominal or pelvic lymph nodes. Reproductive: Status post hysterectomy. No adnexal masses. Other: No abdominal wall hernia or abnormality. No abdominopelvic ascites. Musculoskeletal: No acute or significant osseous findings. Review of the MIP images confirms the above findings. IMPRESSION: 1. No definite evidence of pulmonary embolus. 2. Coronary artery calcifications are noted suggesting coronary artery disease. 3. Large sliding-type hiatal hernia is noted. 4. Probable cholelithiasis without inflammation. 5. 16 mm calculus is seen in upper pole of right kidney which appears to be within the infundibulum in resulting in mild upper pole caliectasis and obstruction. 6. Sigmoid diverticulosis is noted without inflammation. 7. Aortic atherosclerosis. Aortic Atherosclerosis (ICD10-I70.0). Electronically Signed   By: Marijo Conception M.D.   On: 02/24/2019 14:40   Dg Chest Portable 1 View  Result Date: 02/24/2019  CLINICAL DATA:  Shortness of breath EXAM: PORTABLE CHEST 1 VIEW COMPARISON:  12/16/2015, 02/10/2008 FINDINGS: Heart size appears mildly enlarged. Atherosclerotic calcification of the aortic knob. There is a large retrocardiac hiatal hernia. No focal airspace consolidation, pleural effusion, or pneumothorax. No acute osseous findings. IMPRESSION: 1. Large hiatal hernia. 2. No acute cardiopulmonary findings. 3. Mild cardiomegaly. Electronically Signed   By: Davina Poke M.D.   On:  02/24/2019 12:49     All images have been reviewed by me personally.  EKG-no acute ST-T changes, right bundle branch block  Assessment/Plan Principal Problem:   Cardiac arrest ([place]) Active Problems:   Diabetes ([place])   HTN (hypertension)   Right renal stone   Nausea & vomiting   Syncope   Hypokalemia   Acute lower UTI    Asystole/cardiac arrest -Unknown exact etiology.  Check TSH, electrolytes.  Stat echocardiogram ordered.  Cardiology team consulted.  Nausea vomiting secondary right-sided renal stone, 69mm Urinary tract infection with mild hematuria -IV fluid, Flomax, antiemetics, pain control -Urology consulted, once cleared by cardiology patient will need surgical intervention. -Urine cultures, IV Rocephin  Hypokalemia/hypomagnesemia -Repletion ordered  Diet controlled diabetes mellitus type 2 -Daily checks  Essential hypertension -Hold off on home medications  GERD -PPI   DVT prophylaxis: Lovenox Code Status: Full code Family Communication: Niece at bedside Disposition Plan: To be determined Consults called: Neurology, cardiology Admission status: Admit inpatient given patient's renal stone, cardiac arrest.  She will need cardiac clearance followed by surgical intervention for stone removal.   Time Spent: 65 minutes.  >50% of the time was devoted to discussing the patients care, assessment, plan and disposition with other care givers along with counseling the patient about the risks and benefits of treatment.    Dannia Snook Arsenio Loader MD [facility]  If 7PM-7AM, please contact night-coverage   02/24/2019, 4:51 PM

## 2019-02-25 ENCOUNTER — Inpatient Hospital Stay: Payer: PPO | Admitting: Anesthesiology

## 2019-02-25 ENCOUNTER — Inpatient Hospital Stay: Payer: PPO

## 2019-02-25 ENCOUNTER — Other Ambulatory Visit: Payer: Self-pay

## 2019-02-25 ENCOUNTER — Encounter
Admission: EM | Disposition: A | Discharge status: Home Health | Payer: Self-pay | Source: Home / Self Care | Attending: Internal Medicine

## 2019-02-25 ENCOUNTER — Inpatient Hospital Stay
Admit: 2019-02-25 | Discharge: 2019-02-25 | Disposition: A | Discharge status: Home / Self Care | Payer: PPO | Attending: Cardiovascular Disease | Admitting: Cardiovascular Disease

## 2019-02-25 ENCOUNTER — Encounter: Payer: Self-pay | Admitting: Anesthesiology

## 2019-02-25 DIAGNOSIS — I469 Cardiac arrest, cause unspecified: Secondary | ICD-10-CM

## 2019-02-25 DIAGNOSIS — E876 Hypokalemia: Secondary | ICD-10-CM

## 2019-02-25 DIAGNOSIS — I10 Essential (primary) hypertension: Secondary | ICD-10-CM

## 2019-02-25 DIAGNOSIS — N200 Calculus of kidney: Principal | ICD-10-CM

## 2019-02-25 DIAGNOSIS — N390 Urinary tract infection, site not specified: Secondary | ICD-10-CM

## 2019-02-25 DIAGNOSIS — N132 Hydronephrosis with renal and ureteral calculous obstruction: Secondary | ICD-10-CM | POA: Diagnosis not present

## 2019-02-25 HISTORY — PX: CYSTOSCOPY/URETEROSCOPY/HOLMIUM LASER/STENT PLACEMENT: SHX6546

## 2019-02-25 LAB — COMPREHENSIVE METABOLIC PANEL
ALT: 13 U/L (ref 0–44)
AST: 16 U/L (ref 15–41)
Albumin: 3 g/dL — ABNORMAL LOW (ref 3.5–5.0)
Alkaline Phosphatase: 57 U/L (ref 38–126)
Anion gap: 8 (ref 5–15)
BUN: 17 mg/dL (ref 8–23)
CO2: 22 mmol/L (ref 22–32)
Calcium: 8.4 mg/dL — ABNORMAL LOW (ref 8.9–10.3)
Chloride: 109 mmol/L (ref 98–111)
Creatinine, Ser: 0.86 mg/dL (ref 0.44–1.00)
GFR calc Af Amer: >60 mL/min (ref >60)
GFR calc non Af Amer: >60 mL/min (ref >60)
Glucose, Bld: 112 mg/dL — ABNORMAL HIGH (ref 70–99)
Potassium: 3.6 mmol/L (ref 3.5–5.1)
Sodium: 139 mmol/L (ref 135–145)
Total Bilirubin: 1.4 mg/dL — ABNORMAL HIGH (ref 0.3–1.2)
Total Protein: 5.9 g/dL — ABNORMAL LOW (ref 6.5–8.1)

## 2019-02-25 LAB — BASIC METABOLIC PANEL
Anion gap: 11 (ref 5–15)
BUN: 16 mg/dL (ref 8–23)
CO2: 18 mmol/L — ABNORMAL LOW (ref 22–32)
Calcium: 7.7 mg/dL — ABNORMAL LOW (ref 8.9–10.3)
Chloride: 109 mmol/L (ref 98–111)
Creatinine, Ser: 0.91 mg/dL (ref 0.44–1.00)
GFR calc Af Amer: >60 mL/min (ref >60)
GFR calc non Af Amer: >60 mL/min (ref >60)
Glucose, Bld: 139 mg/dL — ABNORMAL HIGH (ref 70–99)
Potassium: 3.1 mmol/L — ABNORMAL LOW (ref 3.5–5.1)
Sodium: 138 mmol/L (ref 135–145)

## 2019-02-25 LAB — CBC
HCT: 31.8 % — ABNORMAL LOW (ref 36.0–46.0)
Hemoglobin: 11.5 g/dL — ABNORMAL LOW (ref 12.0–15.0)
MCH: 31.4 pg (ref 26.0–34.0)
MCHC: 36.2 g/dL — ABNORMAL HIGH (ref 30.0–36.0)
MCV: 86.9 fL (ref 80.0–100.0)
Platelets: 180 10*3/uL (ref 150–400)
RBC: 3.66 MIL/uL — ABNORMAL LOW (ref 3.87–5.11)
RDW: 12.6 % (ref 11.5–15.5)
WBC: 26.7 10*3/uL — ABNORMAL HIGH (ref 4.0–10.5)
nRBC: 0 % (ref 0.0–0.2)

## 2019-02-25 LAB — GLUCOSE, CAPILLARY
Glucose-Capillary: 105 mg/dL — ABNORMAL HIGH (ref 70–99)
Glucose-Capillary: 116 mg/dL — ABNORMAL HIGH (ref 70–99)

## 2019-02-25 LAB — MAGNESIUM: Magnesium: 2.1 mg/dL (ref 1.7–2.4)

## 2019-02-25 LAB — SURGICAL PCR SCREEN
MRSA, PCR: NEGATIVE
Staphylococcus aureus: NEGATIVE

## 2019-02-25 SURGERY — CYSTOSCOPY/URETEROSCOPY/HOLMIUM LASER/STENT PLACEMENT
Anesthesia: General | Laterality: Right

## 2019-02-25 MED ORDER — PROPOFOL 10 MG/ML IV BOLUS
INTRAVENOUS | Status: AC
  Filled 2019-02-25: qty 20

## 2019-02-25 MED ORDER — SUCCINYLCHOLINE CHLORIDE 20 MG/ML IJ SOLN
INTRAMUSCULAR | Status: DC | PRN
  Administered 2019-02-25: 100 mg via INTRAVENOUS

## 2019-02-25 MED ORDER — SEVOFLURANE IN SOLN
RESPIRATORY_TRACT | Status: AC
  Filled 2019-02-25: qty 250

## 2019-02-25 MED ORDER — FENTANYL CITRATE (PF) 100 MCG/2ML IJ SOLN: 25.0000 ug | INTRAMUSCULAR | Status: DC | PRN

## 2019-02-25 MED ORDER — ROCURONIUM BROMIDE 100 MG/10ML IV SOLN
INTRAVENOUS | Status: DC | PRN
  Administered 2019-02-25: 20 mg via INTRAVENOUS

## 2019-02-25 MED ORDER — DEXAMETHASONE SODIUM PHOSPHATE 10 MG/ML IJ SOLN
INTRAMUSCULAR | Status: DC | PRN
  Administered 2019-02-25: 10 mg via INTRAVENOUS

## 2019-02-25 MED ORDER — SUGAMMADEX SODIUM 500 MG/5ML IV SOLN
INTRAVENOUS | Status: DC | PRN
  Administered 2019-02-25: 400 mg via INTRAVENOUS

## 2019-02-25 MED ORDER — CHLORHEXIDINE GLUCONATE CLOTH 2 % EX PADS
6.0000 | MEDICATED_PAD | Freq: Every day | CUTANEOUS | Status: DC
  Administered 2019-02-25 – 2019-02-26 (×2): 6 via TOPICAL

## 2019-02-25 MED ORDER — INFLUENZA VAC A&B SA ADJ QUAD 0.5 ML IM PRSY
0.5000 mL | PREFILLED_SYRINGE | INTRAMUSCULAR | Status: AC
  Administered 2019-02-26: 09:00:00 0.5 mL via INTRAMUSCULAR
  Filled 2019-02-25: qty 0.5

## 2019-02-25 MED ORDER — LIDOCAINE HCL (CARDIAC) PF 100 MG/5ML IV SOSY
PREFILLED_SYRINGE | INTRAVENOUS | Status: DC | PRN
  Administered 2019-02-25: 100 mg via INTRAVENOUS

## 2019-02-25 MED ORDER — PROPOFOL 10 MG/ML IV BOLUS
INTRAVENOUS | Status: DC | PRN
  Administered 2019-02-25 (×2): 100 mg via INTRAVENOUS

## 2019-02-25 MED ORDER — ONDANSETRON HCL 4 MG/2ML IJ SOLN
INTRAMUSCULAR | Status: AC
  Filled 2019-02-25: qty 2

## 2019-02-25 MED ORDER — PHENYLEPHRINE HCL (PRESSORS) 10 MG/ML IV SOLN
INTRAVENOUS | Status: DC | PRN
  Administered 2019-02-25 (×2): 100 ug via INTRAVENOUS
  Administered 2019-02-25: 200 ug via INTRAVENOUS
  Administered 2019-02-25 (×3): 100 ug via INTRAVENOUS

## 2019-02-25 MED ORDER — ONDANSETRON HCL 4 MG/2ML IJ SOLN
4.0000 mg | Freq: Once | INTRAMUSCULAR | Status: AC | PRN
  Administered 2019-02-25: 14:00:00 4 mg via INTRAVENOUS

## 2019-02-25 MED ORDER — SUGAMMADEX SODIUM 500 MG/5ML IV SOLN: INTRAVENOUS | Status: DC | PRN

## 2019-02-25 MED ORDER — SODIUM CHLORIDE 0.9 % IV BOLUS
1000.0000 mL | Freq: Once | INTRAVENOUS | Status: AC
  Administered 2019-02-25: 17:00:00 1000 mL via INTRAVENOUS

## 2019-02-25 MED ORDER — LACTATED RINGERS IV SOLN
INTRAVENOUS | Status: DC | PRN
  Administered 2019-02-25: 12:00:00 via INTRAVENOUS

## 2019-02-25 MED ORDER — CIPROFLOXACIN IN D5W 400 MG/200ML IV SOLN
400.0000 mg | Freq: Once | INTRAVENOUS | Status: AC
  Administered 2019-02-25: 400 mg via INTRAVENOUS
  Filled 2019-02-25: qty 200

## 2019-02-25 MED ORDER — LACTATED RINGERS IV BOLUS
500.0000 mL | Freq: Once | INTRAVENOUS | Status: AC
  Administered 2019-02-25: 09:00:00 500 mL via INTRAVENOUS

## 2019-02-25 MED ORDER — SODIUM CHLORIDE 0.9 % IV SOLN
INTRAVENOUS | Status: AC
  Administered 2019-02-25 – 2019-02-26 (×2): via INTRAVENOUS

## 2019-02-25 MED ORDER — PNEUMOCOCCAL VAC POLYVALENT 25 MCG/0.5ML IJ INJ
0.5000 mL | INJECTION | INTRAMUSCULAR | Status: AC
  Administered 2019-02-26: 09:00:00 0.5 mL via INTRAMUSCULAR
  Filled 2019-02-25: qty 0.5

## 2019-02-25 MED ORDER — FENTANYL CITRATE (PF) 100 MCG/2ML IJ SOLN
INTRAMUSCULAR | Status: AC
  Filled 2019-02-25: qty 2

## 2019-02-25 SURGICAL SUPPLY — 32 items
BAG DRAIN CYSTO-URO LG1000N (MISCELLANEOUS) ×3 IMPLANT
BAG URINE DRAIN 2000ML AR STRL (UROLOGICAL SUPPLIES) ×3 IMPLANT
BRUSH SCRUB EZ 1% IODOPHOR (MISCELLANEOUS) ×3 IMPLANT
CATH FOL 2WAY LX 18X30 (CATHETERS) ×3 IMPLANT
CATH URETL 5X70 OPEN END (CATHETERS) IMPLANT
CNTNR SPEC 2.5X3XGRAD LEK (MISCELLANEOUS)
CONT SPEC 4OZ STER OR WHT (MISCELLANEOUS)
CONTAINER SPEC 2.5X3XGRAD LEK (MISCELLANEOUS) IMPLANT
DRAPE UTILITY 15X26 TOWEL STRL (DRAPES) ×3 IMPLANT
FIBER LASER TRAC TIP (UROLOGICAL SUPPLIES) IMPLANT
GLOVE BIOGEL PI IND STRL 7.5 (GLOVE) ×3 IMPLANT
GLOVE BIOGEL PI INDICATOR 7.5 (GLOVE) ×6
GOWN STRL REUS W/ TWL LRG LVL3 (GOWN DISPOSABLE) ×2 IMPLANT
GOWN STRL REUS W/ TWL XL LVL3 (GOWN DISPOSABLE) ×1 IMPLANT
GOWN STRL REUS W/TWL LRG LVL3 (GOWN DISPOSABLE) ×4
GOWN STRL REUS W/TWL XL LVL3 (GOWN DISPOSABLE) ×2
GUIDEWIRE GREEN .038 145CM (MISCELLANEOUS) ×3 IMPLANT
GUIDEWIRE STR DUAL SENSOR (WIRE) ×3 IMPLANT
INFUSOR MANOMETER BAG 3000ML (MISCELLANEOUS) ×3 IMPLANT
INTRODUCER DILATOR DOUBLE (INTRODUCER) IMPLANT
KIT TURNOVER CYSTO (KITS) ×3 IMPLANT
PACK CYSTO AR (MISCELLANEOUS) ×3 IMPLANT
SET CYSTO W/LG BORE CLAMP LF (SET/KITS/TRAYS/PACK) ×3 IMPLANT
SHEATH URETERAL 12FRX35CM (MISCELLANEOUS) IMPLANT
SOL .9 NS 3000ML IRR  AL (IV SOLUTION) ×2
SOL .9 NS 3000ML IRR UROMATIC (IV SOLUTION) ×1 IMPLANT
STENT URET 6FRX24 CONTOUR (STENTS) IMPLANT
STENT URET 6FRX26 CONTOUR (STENTS) ×3 IMPLANT
SURGILUBE 2OZ TUBE FLIPTOP (MISCELLANEOUS) ×3 IMPLANT
SYR 10ML LL (SYRINGE) ×3 IMPLANT
VALVE UROSEAL ADJ ENDO (VALVE) ×3 IMPLANT
WATER STERILE IRR 1000ML POUR (IV SOLUTION) ×3 IMPLANT

## 2019-02-25 NOTE — Anesthesia Preprocedure Evaluation (Signed)
Anesthesia Evaluation  Patient identified by MRN, date of birth, ID band Patient awake    Reviewed: Allergy & Precautions, NPO status , Patient's Chart, lab work & pertinent test results, reviewed documented beta blocker date and time   Airway Mallampati: II  TM Distance: >3 FB     Dental  (+) Chipped   Pulmonary           Cardiovascular hypertension, Pt. on medications + dysrhythmias Supra Ventricular Tachycardia      Neuro/Psych Anxiety  Neuromuscular disease    GI/Hepatic hiatal hernia, GERD  Controlled,  Endo/Other  diabetes, Type 2  Renal/GU Renal disease     Musculoskeletal  (+) Arthritis ,   Abdominal   Peds  Hematology   Anesthesia Other Findings Had asystole for 30 secs- 1 minute. Cardiologist has seen her with echo which was ok. He thinks it could have been from hypokalemia or low Mg. Does not think she is allergic to morphine. EKG shows Rbbb.  Reproductive/Obstetrics                             Anesthesia Physical Anesthesia Plan  ASA: III  Anesthesia Plan: General   Post-op Pain Management:    Induction: Intravenous  PONV Risk Score and Plan:   Airway Management Planned: Oral ETT and LMA  Additional Equipment:   Intra-op Plan:   Post-operative Plan:   Informed Consent: I have reviewed the patients History and Physical, chart, labs and discussed the procedure including the risks, benefits and alternatives for the proposed anesthesia with the patient or authorized representative who has indicated his/her understanding and acceptance.       Plan Discussed with: CRNA  Anesthesia Plan Comments:         Anesthesia Quick Evaluation

## 2019-02-25 NOTE — Progress Notes (Signed)
PT Cancellation Note  Patient Details Name: Ashley Tanner MRN: [id number] DOB: January 08, 1941   Cancelled Treatment:    Reason Eval/Treat Not Completed: Patient at procedure or test/unavailable Pt out of room for surgery, will attempt again tomorrow.  Of note, pt had very brief (~1 minute) code 11/27.  Kreg Shropshire, DPT 02/25/2019, 11:59 AM

## 2019-02-25 NOTE — Op Note (Signed)
Date of procedure: 02/25/19  Preoperative diagnosis:  1. Obstructing right 1.6 cm upper pole infundibular stone, UTI  Postoperative diagnosis:  1. Same  Procedure: 1. Cystoscopy, right retrograde pyelogram with intraoperative interpretation, right ureteral stent placement  Surgeon: Nickolas Madrid, MD  Anesthesia: General  Complications: None  Intraoperative findings:  1.  Right upper pole infundibular stone pushed back into the upper pole, excellent stent curl in the upper pole 2.  Narrow UPJ on attempted ureteroscopy  EBL: None  Specimens: None  Drains: Right 6 French by 26 cm ureteral stent, 18 French Foley  Indication: Ashley Tanner is a 78 y.o. patient that presented with right-sided abdominal pain as well as electrolyte abnormalities and some runs of asystole.  CT showed a 1.5 cm right upper pole infundibular stone that appeared to be causing some obstruction.  Urinalysis was questionable, and she was afebrile with no leukocytosis and we elected to first work-up for cardiac issues.  Cardiac work-up was negative and resolved with electrolyte repletion, echo was normal, and she spiked a leukocytosis to 27K this morning.  We elected to proceed with urgent attempted right ureteral stent placement.  After reviewing the management options for treatment, they elected to proceed with the above surgical procedure(s). We have discussed the potential benefits and risks of the procedure, side effects of the proposed treatment, the likelihood of the patient achieving the goals of the procedure, and any potential problems that might occur during the procedure or recuperation. Informed consent has been obtained.  Description of procedure:  The patient was taken to the operating room and general anesthesia was induced. SCDs were placed for DVT prophylaxis. The patient was placed in the dorsal lithotomy position, prepped and draped in the usual sterile fashion, and preoperative  antibiotics(Cipro) were administered. A preoperative time-out was performed.   A 21 French rigid cystoscope was used to intubate the urethra.  Cystoscopy was performed and was normal.  On fluoroscopy, the stone could clearly be seen lodged in an upper pole infundibulum.  A right retrograde pyelogram showed no hydronephrosis in the renal pelvis, and no contrast passed alongside the clearly seen upper pole infundibular stone.  With the aid of the access catheter, I was able to navigate a sensor wire past the stone into the upper pole.  The access catheter was advanced over the wire and appeared to dislodge the stone and it migrated back into the upper pole.  With the access catheter in the upper pole the wire was removed and there was drainage of some purulent material.  The sensor wire was replaced and the access catheter removed.  With her complex presentation, I elected to attempt right ureteroscopy just to confirm placement of the wire in the correct location.  A dual-lumen access catheter was used to add a second wire into the renal pelvis.  A single-channel flexible ureteroscope was then advanced over that wire however met resistance at the UPJ.  Under direct vision there was distinct narrowing of the UPJ that did not allow passage of the ureteroscope.  Contrast was injected and filled all the collecting systems and it appeared that the wire was adequately curled in the upper pole.  A 6 French by 26 cm ureteral stent was advanced over the wire and under fluoroscopic vision there was an excellent curl in the upper pole as well as in the bladder.  Cystoscopy was performed and there was purulent drainage of fluid through the side-port of the stent.  An 4 [place] Foley  was placed to maximize drainage in the postop period.  Disposition: Stable to PACU  Plan: Continue antibiotics and resuscitation Okay to remove Foley when clinically stable and afebrile > 24 hours We will arrange definitive ureteroscopy,  laser lithotripsy and stent exchange in ~3 weeks when her infection has been treated  Nickolas Madrid, MD

## 2019-02-25 NOTE — Anesthesia Procedure Notes (Signed)
Procedure Name: Intubation Date/Time: 02/25/2019 11:44 AM Performed by: Justus Memory, CRNA Pre-anesthesia Checklist: Patient identified, Patient being monitored, Timeout performed, Emergency Drugs available and Suction available Patient Re-evaluated:Patient Re-evaluated prior to induction Oxygen Delivery Method: Circle system utilized Preoxygenation: Pre-oxygenation with 100% oxygen Induction Type: IV induction Ventilation: Mask ventilation without difficulty Laryngoscope Size: 3 and McGraph Grade View: Grade I Tube type: Oral Tube size: 7.0 mm Number of attempts: 1 Airway Equipment and Method: Stylet,  Video-laryngoscopy,  Rigid stylet and Patient positioned with wedge pillow Placement Confirmation: ETT inserted through vocal cords under direct vision,  positive ETCO2 and breath sounds checked- equal and bilateral Secured at: 21 cm Tube secured with: Tape Dental Injury: Teeth and Oropharynx as per pre-operative assessment

## 2019-02-25 NOTE — Consult Note (Addendum)
02/25/19 8:38 AM   Ashley Tanner 23-Nov-1940 [id number]  CC: Right sided abdominal pain, 1.5 cm partially obstructing right upper pole stone  HPI: I was asked to see Ashley Tanner in consultation for right-sided abdominal pain and an obstructing 1.5 cm upper pole stone from Dr. Jari Pigg.  She is a 78 year old female who I previously followed in clinic for the nonobstructing right-sided renal stone, and she had refused intervention multiple times as she was asymptomatic.  She reports 2 days of right-sided abdominal and groin pain, intermittent hematuria, and nausea.  She denies any fevers, chills, dysuria, urgency, frequency.  Urinalysis was questionable with few bacteria, 11-20 WBCs, nitrite positive, trace leukocytes.  White count on admission was normal at 7.6 and she was afebrile.  She was given ceftriaxone in the ED.  She had an episode of asystole in route to the hospital on 11/27, and this was felt to be secondary to electrolyte abnormalities with hypokalemia.  She was evaluated by cardiology, and had an additional run of asystole in the ER last night.  With her cardiac issues, we elected to wait till 11/28 for ureteroscopy, laser lithotripsy, and stent placement for completion of cardiology work-up.  This morning, she has had a significant rise in her leukocytosis to 26.7k.  She continues to be afebrile, but has had some soft blood pressures this morning of 95/60.  She denies any complaint this morning, specifically chest pain, shortness of breath, flank pain, groin pain, urinary symptoms, or fevers.  She underwent a bedside echo this morning that was normal with no significant abnormalities.  She has been cleared for surgery from a cardiology perspective.   PMH: Past Medical History:  Diagnosis Date  . Arthritis   . B12 deficiency   . Chronic diarrhea   . Diabetes mellitus without complication ([place])   . GERD (gastroesophageal reflux disease)   . History of colon polyps   . History of  hiatal hernia   . History of supraventricular tachycardia   . Hoarseness of voice   . Hypertension   . Kidney stone   . Osteoarthritis   . Other constipation   . Peripheral neuropathy   . Psoriasis     Surgical History: Past Surgical History:  Procedure Laterality Date  . ABDOMINAL HYSTERECTOMY    . BREAST BIOPSY Left 01/29/2003   Fibrocystic changes, ductal adenosis, microcalcifications  . BREAST BIOPSY Left 03/29/2017   Affirm Bx- SUPERIOR, CENTRAL, POSTERIOR; STEREOTACTIC BIOPSY: ORGANIZING FAT NECROSIS  . BREAST SURGERY Left November 2004   Fibrocystic changes, ductal adenosis, microcalcifications  . COLONOSCOPY  2013   Dr Vira Agar  . COLONOSCOPY WITH PROPOFOL N/A 10/29/2017   Procedure: COLONOSCOPY WITH PROPOFOL;  Surgeon: Manya Silvas, MD;  Location: [facility];  Service: Endoscopy;  Laterality: N/A;  . ESOPHAGOGASTRODUODENOSCOPY    . EYE SURGERY     Cataract surgery  . FLEXIBLE SIGMOIDOSCOPY    . HERNIA REPAIR  March 2011   [facility], intrathoracic hiatal hernia  . KNEE ARTHROSCOPY Left 2014    Allergies:  Allergies  Allergen Reactions  . Codeine Nausea Only    Nausea and dizziness  . Lisinopril Other (See Comments)    Weakness and dizziness  . Morphine And Related Nausea And Vomiting  . Zoloft [Sertraline Hcl] Other (See Comments)    Dizziness    Family History: Family History  Problem Relation Age of Onset  . Prostate cancer Neg Hx   . Bladder Cancer Neg Hx   . Kidney cancer  Neg Hx     Social History:  reports that she has never smoked. She has never used smokeless tobacco. She reports that she does not drink alcohol or use drugs.  ROS: Please see flowsheet from today's date for complete review of systems.  Physical Exam: BP (!) 98/48   Pulse 77   Temp 98.2 F (36.8 C) (Oral)   Resp 17   Ht 5\' 3"  (1.6 m)   Wt 67.8 kg   SpO2 95%   BMI 26.48 kg/m    Constitutional:  Alert and oriented, No acute distress.  Well-appearing Cardiovascular:  Regular rate and rhythm Respiratory: Clear to auscultation bilaterally GI: Abdomen is soft, nontender, nondistended, no abdominal masses GU: No CVA tenderness Lymph: No cervical or inguinal lymphadenopathy. Skin: No rashes, bruises or suspicious lesions. Neurologic: Grossly intact, no focal deficits, moving all 4 extremities. Psychiatric: Normal mood and affect.  Laboratory Data: Reviewed, see epic  Pertinent Imaging: I have personally reviewed the CT dated 02/24/2019.  There is a 1.6 cm obstructing right upper pole stone lodged in an upper pole infundibulum with upstream hydronephrosis.  There is no dilation of the renal pelvis.  Assessment & Plan:   In summary, she is a 78 year old female who presents with right-sided abdominal pain secondary to an obstructing 1.6 cm upper pole stone that is lodged in upper pole infundibulum with upstream hydronephrosis.  This is in a very challenging location.  She also had some electrolyte abnormalities and 2 runs of asystole for approximately 30 seconds each and was evaluated by cardiology.  Urinalysis was questionable and as she was afebrile without leukocytosis we elected to continue cardiology work-up prior to proceeding with any urology procedure she did not have any definitive signs of infection.  However, this morning her white count has risen to 27k worrisome for infection behind the upper pole stone.  She has been evaluated again by cardiology, a bedside echo was performed which was normal, electrolyte abnormalities have been corrected, and she was cleared by cardiology for surgery/procedure.  I had a long conversation with Dr. Kathlene Cote of interventional radiology regarding her complex presentation.  Her 2 options would be a percutaneous nephrostomy tube in the upper pole behind the stone or cystoscopy and attempted right ureteral stent placement.  I anticipate this may require ureteroscopy and laser lithotripsy of enough stone to try to sneak a  ureteral stent alongside the stone into the upper pole.  This would be very challenging, and may worsen her infection/sepsis with high pressure ureteroscopy.   Stat bedside renal ultrasound this morning shows minimal dilation behind the stone in the upper pole, likely contributing to the lack of hydronephrosis is her dehydration.  After discussion with Dr. Kathlene Cote of interventional radiology, we Tanner felt the best option would be cystoscopy and attempted right ureteral stent placement into the upper pole with likely need for ureteroscopy and potentially laser lithotripsy of some stone to access the upper pole and drain suspected upper pole infection.  -Continue antibiotics, resuscitation -OR this morning for cystoscopy and right ureteral stent placement, will likely require some ureteroscopy and laser of some stone to pass the stent into the upper pole -Will need definitive management of her stone in 2 to 3 weeks when infection treated with outpatient ureteroscopy, laser lithotripsy, and stent placement  I discussed her case at length with Dr. Kathlene Cote of interventional radiology, Dr. Humphrey Rolls of cardiology, and Dr. Manuella Ghazi with the hospitalist service.  Billey Co, MD  [place]  [address], [place], [place] [address] [phone]

## 2019-02-25 NOTE — Anesthesia Postprocedure Evaluation (Signed)
Anesthesia Post Note  Patient: Ashley Tanner  Procedure(s) Performed: CYSTOSCOPY/URETEROSCOPY//STENT PLACEMENT (Right )  Patient location during evaluation: PACU Anesthesia Type: General Level of consciousness: awake and alert Pain management: pain level controlled Vital Signs Assessment: post-procedure vital signs reviewed and stable Respiratory status: spontaneous breathing, nonlabored ventilation, respiratory function stable and patient connected to nasal cannula oxygen Cardiovascular status: blood pressure returned to baseline and stable Postop Assessment: no apparent nausea or vomiting Anesthetic complications: no     Last Vitals:  Vitals:   02/25/19 1431 02/25/19 1438  BP: (!) 106/55   Pulse:  93  Resp: (!) 31 (!) 21  Temp:  36.8 C  SpO2:  92%    Last Pain:  Vitals:   02/25/19 1438  TempSrc: Oral  PainSc: 0-No pain                 Carlea Badour S

## 2019-02-25 NOTE — Progress Notes (Signed)
[place] [facility] [place] NAME: Ashley Tanner    MR#:  [id number]  DATE OF BIRTH:  01-15-1941  SUBJECTIVE:  CHIEF COMPLAINT:   Chief Complaint  Patient presents with  . Abdominal Pain  Seems comfortable, waiting for procedure REVIEW OF SYSTEMS:  Review of Systems  Constitutional: Negative for diaphoresis, fever, malaise/fatigue and weight loss.  HENT: Negative for ear discharge, ear pain, hearing loss, nosebleeds, sore throat and tinnitus.   Eyes: Negative for blurred vision and pain.  Respiratory: Negative for cough, hemoptysis, shortness of breath and wheezing.   Cardiovascular: Negative for chest pain, palpitations, orthopnea and leg swelling.  Gastrointestinal: Negative for abdominal pain, blood in stool, constipation, diarrhea, heartburn, nausea and vomiting.  Genitourinary: Negative for dysuria, frequency and urgency.  Musculoskeletal: Negative for back pain and myalgias.  Skin: Negative for itching and rash.  Neurological: Negative for dizziness, tingling, tremors, focal weakness, seizures, weakness and headaches.  Psychiatric/Behavioral: Negative for depression. The patient is not nervous/anxious.     DRUG ALLERGIES:   Allergies  Allergen Reactions  . Codeine Nausea Only    Nausea and dizziness  . Lisinopril Other (See Comments)    Weakness and dizziness  . Morphine And Related Nausea And Vomiting  . Zoloft [Sertraline Hcl] Other (See Comments)    Dizziness   VITALS:  Blood pressure (!) 106/55, pulse 93, temperature 98.3 F (36.8 C), temperature source Oral, resp. rate (!) 21, height 5\' 3"  (1.6 m), weight 67.8 kg, SpO2 92 %. PHYSICAL EXAMINATION:  Physical Exam HENT:     Head: Normocephalic and atraumatic.  Eyes:     Conjunctiva/sclera: Conjunctivae normal.     Pupils: Pupils are equal, round, and reactive to light.  Neck:     Musculoskeletal: Normal range of motion and neck supple.     Thyroid: No thyromegaly.     Trachea: No  tracheal deviation.  Cardiovascular:     Rate and Rhythm: Normal rate and regular rhythm.     Heart sounds: Normal heart sounds.  Pulmonary:     Effort: Pulmonary effort is normal. No respiratory distress.     Breath sounds: Normal breath sounds. No wheezing.  Chest:     Chest wall: No tenderness.  Abdominal:     General: Bowel sounds are normal. There is no distension.     Palpations: Abdomen is soft.     Tenderness: There is no abdominal tenderness.  Musculoskeletal: Normal range of motion.  Skin:    General: Skin is warm and dry.     Findings: No rash.  Neurological:     Mental Status: She is alert and oriented to person, place, and time.     Cranial Nerves: No cranial nerve deficit.    LABORATORY PANEL:  Female CBC Recent Labs  Lab 02/25/19 0514  WBC 26.7*  HGB 11.5*  HCT 31.8*  PLT 180   ------------------------------------------------------------------------------------------------------------------ Chemistries  Recent Labs  Lab 02/25/19 0514  NA 139  K 3.6  CL 109  CO2 22  GLUCOSE 112*  BUN 17  CREATININE 0.86  CALCIUM 8.4*  MG 2.1  AST 16  ALT 13  ALKPHOS 57  BILITOT 1.4*   RADIOLOGY:  US Renal  Result Date: 02/25/2019 CLINICAL DATA:  Renal stone EXAM: RENAL / URINARY TRACT ULTRASOUND COMPLETE COMPARISON:  None. FINDINGS: Right Kidney: Renal measurements: 10.5 x 4.7 x 5.0 cm = volume: 131 mL. RIGHT renal stone measuring 1.7 cm. No hydronephrosis.  Cortical echogenicity is within normal limits. Left Kidney: Renal measurements: 10.9 x 4.7 x 4.8 cm = volume: 130 mL. Echogenicity within normal limits. No mass or hydronephrosis visualized. No renal stones seen. Bladder: Appears normal for degree of bladder distention. Other: None. IMPRESSION: 1. RIGHT renal stone measuring 1.7 cm. No associated hydronephrosis. 2. Normal LEFT kidney. Electronically Signed   By: Franki Cabot M.D.   On: 02/25/2019 09:00   Dg Or Urology Cysto Image ([facility] Only)  Result Date:  02/25/2019 There is no interpretation for this exam.  This order is for images obtained during a surgical procedure.  Please See "Surgeries" Tab for more information regarding the procedure.   ASSESSMENT AND PLAN:   Asystole/cardiac arrest -Unknown exact etiology. -Cardiology following and feels this may be due to electrolyte issues -Echo within normal limit  Nausea vomiting secondary right-sided renal stone, 76mm Urinary tract infection with mild hematuria -S/p stent placement in the right.  Will need outpatient ureteroscopy in 2 to 3 weeks once infection cleared per urology -Continue IV fluid, Flomax, antiemetics, pain control -Urine cultures, IV Rocephin  Hypokalemia/hypomagnesemia -Replete and recheck  Diet controlled diabetes mellitus type 2 -Daily checks  Essential hypertension -Hold off on home medications  GERD -PPI   All the records are reviewed and case discussed with Care Management/Social Worker. Management plans discussed with the patient, nursing and they are in agreement.  CODE STATUS: Full Code  TOTAL TIME TAKING CARE OF THIS PATIENT: 35 minutes.   More than 50% of the time was spent in counseling/coordination of care: YES  POSSIBLE D/C IN 1-2 DAYS, DEPENDING ON CLINICAL CONDITION.   Max Sane M.D on 02/25/2019 at 3:03 PM  Between 7am to 6pm - Pager - [phone]  After 6pm go to [email or web address] - password [email or web address]  [facility]   CC: Primary care physician; Dion Body, MD  Note: This dictation was prepared with Dragon dictation along with smaller phrase technology. Any transcriptional errors that result from this process are unintentional.

## 2019-02-25 NOTE — Progress Notes (Signed)
OT Cancellation Note  Patient Details Name: MALARI PASKINS MRN: [id number] DOB: 05/26/1940   Cancelled Treatment:    Reason Eval/Treat Not Completed: Patient not medically ready Order received for OT evaluation and chart reviewed.  Called RN who indicated pt was getting prepped for surgery.  Will monitor orders for continuation of order after surgery, otherwise will need new OT eval orders.    Chrys Racer, OTR/L, [facility] ascom [phone] 02/25/19, 9:29 AM

## 2019-02-25 NOTE — Anesthesia Post-op Follow-up Note (Signed)
Anesthesia QCDR form completed.        

## 2019-02-25 NOTE — OR Nursing (Signed)
Pt more comfortable respiratory rate between 24 and 31 temp down to 100.8 pulse 92-96 no longer shivering.

## 2019-02-25 NOTE — Progress Notes (Signed)
SUBJECTIVE: Patient denies any chest pain shortness of breath or dizziness   Vitals:   02/25/19 0200 02/25/19 0700 02/25/19 0737 02/25/19 0800  BP: (!) 94/50 (!) 112/55  (!) 98/48  Pulse: 77 77 80 77  Resp: 17 18 18 17   Temp: 98.7 F (37.1 C)  98.2 F (36.8 C)   TempSrc: Oral  Oral   SpO2: 93% 95% 95% 95%  Weight:      Height:        Intake/Output Summary (Last 24 hours) at 02/25/2019 0823 Last data filed at 02/25/2019 0800 Gross per 24 hour  Intake 1510.94 ml  Output 1000 ml  Net 510.94 ml    LABS: Basic Metabolic Panel: Recent Labs    02/24/19 1232 02/24/19 1354 02/25/19 0514  NA 137  --  139  K 2.7*  --  3.6  CL 105  --  109  CO2 21*  --  22  GLUCOSE 168*  --  112*  BUN 18  --  17  CREATININE 0.87  --  0.86  CALCIUM 8.8*  --  8.4*  MG  --  1.4* 2.1   Liver Function Tests: Recent Labs    02/24/19 1232 02/25/19 0514  AST 18 16  ALT 11 13  ALKPHOS 70 57  BILITOT 2.2* 1.4*  PROT 6.3* 5.9*  ALBUMIN 3.5 3.0*   Recent Labs    02/24/19 1232  LIPASE 22   CBC: Recent Labs    02/24/19 1232 02/25/19 0514  WBC 7.6 26.7*  NEUTROABS 7.1  --   HGB 12.7 11.5*  HCT 36.7 31.8*  MCV 90.6 86.9  PLT 233 180   Cardiac Enzymes: No results for input(s): CKTOTAL, CKMB, CKMBINDEX, TROPONINI in the last 72 hours. BNP: Invalid input(s): POCBNP D-Dimer: No results for input(s): DDIMER in the last 72 hours. Hemoglobin A1C: No results for input(s): HGBA1C in the last 72 hours. Fasting Lipid Panel: No results for input(s): CHOL, HDL, LDLCALC, TRIG, CHOLHDL, LDLDIRECT in the last 72 hours. Thyroid Function Tests: Recent Labs    02/24/19 1232  TSH 5.450*   Anemia Panel: No results for input(s): VITAMINB12, FOLATE, FERRITIN, TIBC, IRON, RETICCTPCT in the last 72 hours.   PHYSICAL EXAM General: Well developed, well nourished, in no acute distress HEENT:  Normocephalic and atramatic Neck:  No JVD.  Lungs: Clear bilaterally to auscultation and  percussion. Heart: HRRR . Normal S1 and S2 without gallops or murmurs.  Abdomen: Bowel sounds are positive, abdomen soft and non-tender  Msk:  Back normal, normal gait. Normal strength and tone for age. Extremities: No clubbing, cyanosis or edema.   Neuro: Alert and oriented X 3. Psych:  Good affect, responds appropriately  TELEMETRY: Sinus rhythm  ASSESSMENT AND PLAN: Patient had 2 episodes of asystole apparently lasting 30 seconds, most likely due to severe hypokalemia and hypomagnesemia.  Potassium this morning is 3.6 however the white count went up from 7.6 to 26.  Patient also has a BP systolic 98.  Echocardiogram on bedside preliminary report ejection fraction 59% with normal wall motion and no significant regurgitation fibrocalcified aortic valve without aortic stenosis.  Since echo is unremarkable advise giving IV fluid to bring blood pressure above 100 and advise continuing KCl to bring potassium level to around 4.0.  Since procedure is to be done by urology is urgent/emergent advise proceeding with procedure if it is okay with the medical team since white count is elevated.  Cardiac point of view patient is cleared for urological procedure.  Principal Problem:   Cardiac arrest [facility]) Active Problems:   Diabetes ([place])   HTN (hypertension)   Right renal stone   Nausea & vomiting   Syncope   Hypokalemia   Acute lower UTI    Neoma Laming A, MD, [facility] 02/25/2019 8:23 AM

## 2019-02-25 NOTE — Transfer of Care (Signed)
Immediate Anesthesia Transfer of Care Note  Patient: Ashley Tanner  Procedure(s) Performed: CYSTOSCOPY/URETEROSCOPY/HOLMIUM LASER/STENT PLACEMENT (Right )  Patient Location: PACU  Anesthesia Type:General  Level of Consciousness: sedated  Airway & Oxygen Therapy: Patient Spontanous Breathing and Patient connected to face mask oxygen  Post-op Assessment: Report given to RN and Post -op Vital signs reviewed and stable  Post vital signs: Reviewed and stable  Last Vitals:  Vitals Value Taken Time  BP 112/83 02/25/19 1246  Temp    Pulse 99 02/25/19 1252  Resp 37 02/25/19 1252  SpO2 93 % 02/25/19 1252  Vitals shown include unvalidated device data.  Last Pain:  Vitals:   02/25/19 0737  TempSrc: Oral  PainSc: 0-No pain         Complications: No apparent anesthesia complications

## 2019-02-25 NOTE — Progress Notes (Signed)
*  PRELIMINARY RESULTS* Echocardiogram 2D Echocardiogram has been performed.  Ashley Tanner 02/25/2019, 8:26 AM

## 2019-02-26 ENCOUNTER — Encounter: Payer: Self-pay | Admitting: Urology

## 2019-02-26 DIAGNOSIS — E119 Type 2 diabetes mellitus without complications: Secondary | ICD-10-CM

## 2019-02-26 LAB — COMPREHENSIVE METABOLIC PANEL
ALT: 12 U/L (ref 0–44)
AST: 18 U/L (ref 15–41)
Albumin: 2.6 g/dL — ABNORMAL LOW (ref 3.5–5.0)
Alkaline Phosphatase: 69 U/L (ref 38–126)
Anion gap: 10 (ref 5–15)
BUN: 18 mg/dL (ref 8–23)
CO2: 18 mmol/L — ABNORMAL LOW (ref 22–32)
Calcium: 8.1 mg/dL — ABNORMAL LOW (ref 8.9–10.3)
Chloride: 110 mmol/L (ref 98–111)
Creatinine, Ser: 0.79 mg/dL (ref 0.44–1.00)
GFR calc Af Amer: >60 mL/min (ref >60)
GFR calc non Af Amer: >60 mL/min (ref >60)
Glucose, Bld: 123 mg/dL — ABNORMAL HIGH (ref 70–99)
Potassium: 3.7 mmol/L (ref 3.5–5.1)
Sodium: 138 mmol/L (ref 135–145)
Total Bilirubin: 1.1 mg/dL (ref 0.3–1.2)
Total Protein: 5.5 g/dL — ABNORMAL LOW (ref 6.5–8.1)

## 2019-02-26 LAB — CBC
HCT: 28.5 % — ABNORMAL LOW (ref 36.0–46.0)
Hemoglobin: 10.2 g/dL — ABNORMAL LOW (ref 12.0–15.0)
MCH: 31.5 pg (ref 26.0–34.0)
MCHC: 35.8 g/dL (ref 30.0–36.0)
MCV: 88 fL (ref 80.0–100.0)
Platelets: 99 10*3/uL — ABNORMAL LOW (ref 150–400)
RBC: 3.24 MIL/uL — ABNORMAL LOW (ref 3.87–5.11)
RDW: 13.2 % (ref 11.5–15.5)
WBC: 26.5 10*3/uL — ABNORMAL HIGH (ref 4.0–10.5)
nRBC: 0 % (ref 0.0–0.2)

## 2019-02-26 LAB — URINE CULTURE: Culture: >=100000 — AB

## 2019-02-26 LAB — GLUCOSE, CAPILLARY: Glucose-Capillary: 94 mg/dL (ref 70–99)

## 2019-02-26 LAB — MAGNESIUM: Magnesium: 1.8 mg/dL (ref 1.7–2.4)

## 2019-02-26 MED ORDER — GLUCERNA SHAKE PO LIQD
237.0000 mL | Freq: Two times a day (BID) | ORAL | Status: DC
  Administered 2019-02-26 – 2019-02-27 (×2): 237 mL via ORAL

## 2019-02-26 NOTE — Progress Notes (Signed)
[place] at [place] NAME: Ashley Tanner    MR#:  [id number]  DATE OF BIRTH:  18-May-1940  SUBJECTIVE:  CHIEF COMPLAINT:   Chief Complaint  Patient presents with  . Abdominal Pain  Feels weak, no new complaints REVIEW OF SYSTEMS:  Review of Systems  Constitutional: Negative for diaphoresis, fever, malaise/fatigue and weight loss.  HENT: Negative for ear discharge, ear pain, hearing loss, nosebleeds, sore throat and tinnitus.   Eyes: Negative for blurred vision and pain.  Respiratory: Negative for cough, hemoptysis, shortness of breath and wheezing.   Cardiovascular: Negative for chest pain, palpitations, orthopnea and leg swelling.  Gastrointestinal: Negative for abdominal pain, blood in stool, constipation, diarrhea, heartburn, nausea and vomiting.  Genitourinary: Negative for dysuria, frequency and urgency.  Musculoskeletal: Negative for back pain and myalgias.  Skin: Negative for itching and rash.  Neurological: Negative for dizziness, tingling, tremors, focal weakness, seizures, weakness and headaches.  Psychiatric/Behavioral: Negative for depression. The patient is not nervous/anxious.     DRUG ALLERGIES:   Allergies  Allergen Reactions  . Codeine Nausea Only    Nausea and dizziness  . Lisinopril Other (See Comments)    Weakness and dizziness  . Morphine And Related Nausea And Vomiting  . Zoloft [Sertraline Hcl] Other (See Comments)    Dizziness   VITALS:  Blood pressure 108/78, pulse 79, temperature 98.6 F (37 C), temperature source Oral, resp. rate (!) 26, height 5\' 3"  (1.6 m), weight 67.8 kg, SpO2 93 %. PHYSICAL EXAMINATION:  Physical Exam HENT:     Head: Normocephalic and atraumatic.  Eyes:     Conjunctiva/sclera: Conjunctivae normal.     Pupils: Pupils are equal, round, and reactive to light.  Neck:     Musculoskeletal: Normal range of motion and neck supple.     Thyroid: No thyromegaly.     Trachea: No tracheal  deviation.  Cardiovascular:     Rate and Rhythm: Normal rate and regular rhythm.     Heart sounds: Normal heart sounds.  Pulmonary:     Effort: Pulmonary effort is normal. No respiratory distress.     Breath sounds: Normal breath sounds. No wheezing.  Chest:     Chest wall: No tenderness.  Abdominal:     General: Bowel sounds are normal. There is no distension.     Palpations: Abdomen is soft.     Tenderness: There is no abdominal tenderness.  Musculoskeletal: Normal range of motion.  Skin:    General: Skin is warm and dry.     Findings: No rash.  Neurological:     Mental Status: She is alert and oriented to person, place, and time.     Cranial Nerves: No cranial nerve deficit.    LABORATORY PANEL:  Female CBC Recent Labs  Lab 02/26/19 0510  WBC 26.5*  HGB 10.2*  HCT 28.5*  PLT 99*   ------------------------------------------------------------------------------------------------------------------ Chemistries  Recent Labs  Lab 02/26/19 0510  NA 138  K 3.7  CL 110  CO2 18*  GLUCOSE 123*  BUN 18  CREATININE 0.79  CALCIUM 8.1*  MG 1.8  AST 18  ALT 12  ALKPHOS 69  BILITOT 1.1   RADIOLOGY:  No results found. ASSESSMENT AND PLAN:   Asystole/cardiac arrest -Unknown exact etiology. -Cardiology following and feels this may be due to electrolyte issues -Echo within normal limit  Nausea vomiting secondary right-sided renal stone, 32mm Urinary tract infection with mild hematuria -S/p stent placement  in the right.  Will need outpatient ureteroscopy in 2 to 3 weeks once infection cleared per urology -Continue IV fluid, Flomax, antiemetics, pain control -Urine cultures E. coli, continue IV Rocephin, can switch over to oral Keflex at discharge tomorrow  Hypokalemia/hypomagnesemia -Repleted  Diet controlled diabetes mellitus type 2 -Daily checks  Essential hypertension -Hold off on home medications  GERD -PPI   All the records are reviewed and case  discussed with Care Management/Social Worker. Management plans discussed with the patient, nursing and they are in agreement.  CODE STATUS: Full Code  TOTAL TIME TAKING CARE OF THIS PATIENT: 35 minutes.   More than 50% of the time was spent in counseling/coordination of care: YES  POSSIBLE D/C IN 1 DAYS, DEPENDING ON CLINICAL CONDITION.   Max Sane M.D on 02/26/2019 at 3:10 PM  Between 7am to 6pm - Pager - [phone]  After 6pm go to [email or web address] - password [email or web address]  [facility]   CC: Primary care physician; Dion Body, MD  Note: This dictation was prepared with Dragon dictation along with smaller phrase technology. Any transcriptional errors that result from this process are unintentional.

## 2019-02-26 NOTE — Progress Notes (Signed)
Report called to [place], RN on [place].

## 2019-02-26 NOTE — Progress Notes (Signed)
Initial Nutrition Assessment  DOCUMENTATION CODES:   Not applicable  INTERVENTION:  Provide Glucerna Shake po BID, each supplement provides 220 kcal and 10 grams of protein. Patient prefers strawberry.  NUTRITION DIAGNOSIS:   Inadequate oral intake related to decreased appetite as evidenced by per patient/family report.  GOAL:   Patient will meet greater than or equal to 90% of their needs  MONITOR:   PO intake, Supplement acceptance, Labs, Weight trends, I & O's  REASON FOR ASSESSMENT:   Malnutrition Screening Tool    ASSESSMENT:   78 year old female with PMHx of GERD, HTN, arthritis, vitamin B12 deficiency, hx hiatal hernia, chronic diarrhea, OA, DM, peripheral neuropathy admitted after asystole/cardiac arrest, with N/V, right-sided renal stone s/p right ureteral stent placement 11/28, UTI.   Met with patient at bedside. She reports she has had a decreased appetite and intake over the past 2-3 weeks. At baseline she eats small, frequent meals throughout the day in setting of her large hiatal hernia as she experiences early satiety. Over the past few weeks she has only been tolerate two small meals per day so her intake has decreased. She reports her appetite is starting to pick up and she was able to have most of her dinner last night and also had some breakfast this morning. She is amenable to drinking ONS to help meet calorie/protein needs and prefers a Soil scientist. Patient reports she ambulates with a cane.  Patient reports her UBW is 146 lbs and that she has been weight-stable. Current weight in chart is 67.8 kg (149.47 lbs).  Medications reviewed and include: NS at 75 mL/hr, ceftriaxone.  Labs reviewed: CBG 94-116, CO2 18.  Patient does not meet criteria for malnutrition at this time.  Discussed with RN.  NUTRITION - FOCUSED PHYSICAL EXAM:    Most Recent Value  Orbital Region  No depletion  Upper Arm Region  No depletion  Thoracic and Lumbar Region  No  depletion  Buccal Region  No depletion  Temple Region  No depletion  Clavicle Bone Region  No depletion  Clavicle and Acromion Bone Region  Mild depletion  Scapular Bone Region  No depletion  Dorsal Hand  No depletion  Patellar Region  Mild depletion  Anterior Thigh Region  Mild depletion  Posterior Calf Region  No depletion  Edema (RD Assessment)  None  Hair  Reviewed  Eyes  Reviewed  Mouth  Reviewed  Skin  Reviewed  Nails  Reviewed     Diet Order:   Diet Order            Diet Heart Room service appropriate? Yes; Fluid consistency: Thin  Diet effective now             EDUCATION NEEDS:   No education needs have been identified at this time  Skin:  Skin Assessment: Reviewed RN Assessment  Last BM:  02/24/2019 per chart  Height:   Ht Readings from Last 1 Encounters:  02/24/19 _0  (1.6 m)   Weight:   Wt Readings from Last 1 Encounters:  02/24/19 67.8 kg   Ideal Body Weight:  52.3 kg  BMI:  Body mass index is 26.48 kg/m.  Estimated Nutritional Needs:   Kcal:  1500-1700  Protein:  75-85 grams  Fluid:  1.5  L/day  Jacklynn Barnacle, MS, RD, LDN Office: [phone] Pager: [phone] After Hours/Weekend Pager: [phone]

## 2019-02-26 NOTE — Evaluation (Signed)
Physical Therapy Evaluation Patient Details Name: Ashley Tanner MRN: [id number] DOB: March 30, 1941 Today's Date: 02/26/2019   History of Present Illness  78 y.o. female with medical history significant of diet-controlled DM 2, essential hypertension, renal stones presented to the hospital with complaints of nausea and vomiting.  Patient states she has been feeling right-sided abdominal pain for the past 2 days along with nausea and vomiting.  En route to the hospital (EMS), she became unconscious and thought she was in asystole, about 20 seconds CPR was performed.  Clinical Impression  Pt was eager to work with PT and ultimately did well with mobility and was able to ambulate ~100 ft, surprising even herself.  Her O2 (on room air) remained in the 90s t/o the effort and her HR elevated slightly but did not exceed 100.  She reports some fatigue but overall did well.  Pt uses a SPC at baseline, and stated that she felt much better with the walker (will get out her husband's old walker and now use, PT educated on appropriate height setting).  Overall pt did well and should be able to manage at home with HHPT and assist from niece as needed.      Follow Up Recommendations Home health PT    Equipment Recommendations  None recommended by PT    Recommendations for Other Services       Precautions / Restrictions Precautions Precautions: None Restrictions Weight Bearing Restrictions: No      Mobility  Bed Mobility Overal bed mobility: Independent             General bed mobility comments: Pt was able to get herself to EOB w/o assist  Transfers Overall transfer level: Modified independent Equipment used: Rolling walker (2 wheeled)             General transfer comment: Pt is able to rise to standing w/o assist, showed good safety and confidence  Ambulation/Gait Ambulation/Gait assistance: Supervision Gait Distance (Feet): 100 Feet Assistive device: Rolling walker (2 wheeled)        General Gait Details: Pt was able to ambulate into the hallway with confident and consistent cadence and minimal reliance on the walker (reports feeling better with the walker than the cane she uses at baseline, plans to get out her walker when home)  Stairs            Wheelchair Mobility    Modified Rankin (Stroke Patients Only)       Balance Overall balance assessment: Modified Independent                                           Pertinent Vitals/Pain Pain Assessment: No/denies pain    Home Living Family/patient expects to be discharged to:: Private residence Living Arrangements: Alone Available Help at Discharge: Family(niece helps as needed)   Home Access: Level entry     Home Layout: One level Home Equipment: Walker - 2 wheels;Cane - single point      Prior Function Level of Independence: Independent with assistive device(s)         Comments: Pt uses cane in and out of home, drives/runs minimal errands     Hand Dominance        Extremity/Trunk Assessment   Upper Extremity Assessment Upper Extremity Assessment: Overall WFL for tasks assessed    Lower Extremity Assessment Lower Extremity Assessment: Overall WFL for tasks  assessed       Communication   Communication: No difficulties  Cognition Arousal/Alertness: Awake/alert                                            General Comments      Exercises     Assessment/Plan    PT Assessment Patient needs continued PT services  PT Problem List Decreased activity tolerance;Decreased balance;Decreased safety awareness;Decreased knowledge of use of DME;Cardiopulmonary status limiting activity;Decreased strength;Decreased mobility       PT Treatment Interventions DME instruction;Gait training;Functional mobility training;Therapeutic activities;Therapeutic exercise;Balance training;Neuromuscular re-education;Patient/family education    PT Goals (Current  goals can be found in the Care Plan section)  Acute Rehab PT Goals Patient Stated Goal: go home PT Goal Formulation: With patient Time For Goal Achievement: 03/12/19 Potential to Achieve Goals: Good    Frequency Min 2X/week   Barriers to discharge        Co-evaluation               AM-PAC PT "6 Clicks" Mobility  Outcome Measure Help needed turning from your back to your side while in a flat bed without using bedrails?: None Help needed moving from lying on your back to sitting on the side of a flat bed without using bedrails?: None Help needed moving to and from a bed to a chair (including a wheelchair)?: None Help needed standing up from a chair using your arms (e.g., wheelchair or bedside chair)?: None Help needed to walk in hospital room?: A Little Help needed climbing 3-5 steps with a railing? : A Little 6 Click Score: 22    End of Session Equipment Utilized During Treatment: Gait belt Activity Tolerance: Patient tolerated treatment well;Patient limited by fatigue Patient left: in chair;with call bell/phone within reach Nurse Communication: Mobility status PT Visit Diagnosis: Muscle weakness (generalized) (M62.81);Difficulty in walking, not elsewhere classified (R26.2)    Time: 1010-1036 PT Time Calculation (min) (ACUTE ONLY): 26 min   Charges:   PT Evaluation $PT Eval Low Complexity: 1 Low PT Treatments $Gait Training: 8-22 mins        Kreg Shropshire, DPT 02/26/2019, 1:23 PM

## 2019-02-26 NOTE — Progress Notes (Signed)
Pt transferred to RM # [id number] at this time. VSS prior to transfer.

## 2019-02-27 LAB — COMPREHENSIVE METABOLIC PANEL
ALT: 10 U/L (ref 0–44)
AST: 14 U/L — ABNORMAL LOW (ref 15–41)
Albumin: 2.5 g/dL — ABNORMAL LOW (ref 3.5–5.0)
Alkaline Phosphatase: 66 U/L (ref 38–126)
Anion gap: 8 (ref 5–15)
BUN: 21 mg/dL (ref 8–23)
CO2: 21 mmol/L — ABNORMAL LOW (ref 22–32)
Calcium: 8.2 mg/dL — ABNORMAL LOW (ref 8.9–10.3)
Chloride: 110 mmol/L (ref 98–111)
Creatinine, Ser: 0.69 mg/dL (ref 0.44–1.00)
GFR calc Af Amer: >60 mL/min (ref >60)
GFR calc non Af Amer: >60 mL/min (ref >60)
Glucose, Bld: 97 mg/dL (ref 70–99)
Potassium: 3.5 mmol/L (ref 3.5–5.1)
Sodium: 139 mmol/L (ref 135–145)
Total Bilirubin: 1 mg/dL (ref 0.3–1.2)
Total Protein: 5.5 g/dL — ABNORMAL LOW (ref 6.5–8.1)

## 2019-02-27 LAB — CBC
HCT: 29 % — ABNORMAL LOW (ref 36.0–46.0)
Hemoglobin: 10.4 g/dL — ABNORMAL LOW (ref 12.0–15.0)
MCH: 31.2 pg (ref 26.0–34.0)
MCHC: 35.9 g/dL (ref 30.0–36.0)
MCV: 87.1 fL (ref 80.0–100.0)
Platelets: 104 10*3/uL — ABNORMAL LOW (ref 150–400)
RBC: 3.33 MIL/uL — ABNORMAL LOW (ref 3.87–5.11)
RDW: 13.3 % (ref 11.5–15.5)
WBC: 20.5 10*3/uL — ABNORMAL HIGH (ref 4.0–10.5)
nRBC: 0 % (ref 0.0–0.2)

## 2019-02-27 LAB — GLUCOSE, CAPILLARY
Glucose-Capillary: 83 mg/dL (ref 70–99)
Glucose-Capillary: 88 mg/dL (ref 70–99)

## 2019-02-27 LAB — ECHOCARDIOGRAM COMPLETE
Height: 63 in
Weight: 2391.55 oz

## 2019-02-27 LAB — MAGNESIUM: Magnesium: 1.9 mg/dL (ref 1.7–2.4)

## 2019-02-27 MED ORDER — CEPHALEXIN 500 MG PO CAPS: 500.0000 mg | ORAL_CAPSULE | Freq: Four times a day (QID) | ORAL | 0 refills | Status: AC

## 2019-02-27 MED ORDER — MAGNESIUM OXIDE 400 (241.3 MG) MG PO TABS: 400.0000 mg | ORAL_TABLET | Freq: Two times a day (BID) | ORAL | 0 refills | Status: DC

## 2019-02-27 MED ORDER — MAGNESIUM SULFATE 2 GM/50ML IV SOLN
2.0000 g | Freq: Once | INTRAVENOUS | Status: DC
  Administered 2019-02-27: 10:00:00 2 g via INTRAVENOUS
  Filled 2019-02-27: qty 50

## 2019-02-27 MED ORDER — SODIUM CHLORIDE 0.9 % IV SOLN
INTRAVENOUS | Status: DC | PRN
  Administered 2019-02-27: 09:00:00 30 mL via INTRAVENOUS

## 2019-02-27 MED ORDER — POTASSIUM CHLORIDE CRYS ER 20 MEQ PO TBCR: 40.0000 meq | EXTENDED_RELEASE_TABLET | Freq: Every day | ORAL | 0 refills | Status: DC

## 2019-02-27 MED ORDER — POTASSIUM CHLORIDE CRYS ER 20 MEQ PO TBCR
40.0000 meq | EXTENDED_RELEASE_TABLET | ORAL | Status: DC
  Administered 2019-02-27: 09:00:00 40 meq via ORAL
  Filled 2019-02-27: qty 2

## 2019-02-27 MED ORDER — MAGNESIUM OXIDE 400 (241.3 MG) MG PO TABS
400.0000 mg | ORAL_TABLET | Freq: Two times a day (BID) | ORAL | Status: DC
  Administered 2019-02-27: 11:00:00 400 mg via ORAL
  Filled 2019-02-27: qty 1

## 2019-02-27 NOTE — Progress Notes (Signed)
   Subjective Afebrile overnight, no complaints Leukocytosis downtrending Foley out and voiding spontaneously No chest pain or cardiac issues  Physical Exam: BP (!) 120/57 (BP Location: Left Arm)   Pulse 73   Temp 98 F (36.7 C) (Oral)   Resp (!) 21   Ht 5\' 3"  (1.6 m)   Wt 71.2 kg   SpO2 96%   BMI 27.81 kg/m    Constitutional:  Alert and oriented, No acute distress. Respiratory: Normal respiratory effort, no increased work of breathing. GI: Abdomen is soft, non-tender, non-distended GU: No CVA tenderness  Laboratory Data: Reviewed  Assessment & Plan:   Ashley Tanner is a 78 year old female postop day #2 from right ureteral stent placement in the upper pole for an obstructing upper pole calyceal stone with infection.  Urine cultures growing pansensitive E. coli.  Doing well.  Okay for discharge from a urology perspective Recommend 2 weeks of culture appropriate antibiotics Will arrange follow-up in 2 to 3 weeks for definitive management of her stone on the right side with ureteroscopy, laser lithotripsy, and stent change Call if questions  Billey Co, MD

## 2019-02-27 NOTE — Discharge Summary (Signed)
Physician Discharge Summary  Ashley Tanner [id number] DOB: 1940/09/11 DOA: 02/24/2019  PCP: Dion Body, MD  Admit date: 02/24/2019 Discharge date: 02/27/2019  Recommendations for Outpatient Follow-up:  1. Right-sided renal stone 48mm with associated pansensitive Escherichia coli urinary tract infection, gross hematuria, nausea and vomiting 2. Asystole, cardiac arrest, 2 episodes thought secondary to electrolyte disorder. 3. Reported hematemesis prior to admission 4. Right bundle branch block new on EKG, incidental findings of aortic atherosclerosis and coronary artery calcifications on CT angiogram of the chest.  Consider further investigation. 5. Hypokalemia, hypomagnesemia.  Consider repeat BMP in the outpatient setting.  Follow-up Information    Dionisio David, MD Follow up on 02/28/2019.   Specialty: Cardiology Why: 10 AM Contact information: [place] [address] [phone]        Billey Co, MD Follow up in 2 week(s).   Specialty: Urology Why: Office will call you with an appointment Contact information: [place] [address] [phone]        Dion Body, MD Follow up in 1 week(s).   Specialty: Family Medicine Why: for check on magnesium and potassium levels Contact information: [place] [address]            Discharge Diagnoses: Principal diagnosis is #1 6. Right-sided renal stone 61mm with associated pansensitive Escherichia coli urinary tract infection, gross hematuria, nausea and vomiting 7. Asystole, cardiac arrest, 2 episodes 8. Reported hematemesis prior to admission 9. Right bundle branch block new on EKG, incidental findings of aortic atherosclerosis and coronary artery calcifications on CT angiogram of the chest 10. Hypokalemia, hypomagnesemia 11. Thrombocytopenia 12. Mild normocytic anemia of acute illness 13. Modestly elevated TSH 14. Diabetes mellitus  type 2, diet-controlled, with peripheral neuropathy 15. Essential hypertension 16. GERD, large hiatal hernia  Discharge Condition: improved Disposition: home  Diet recommendation: diabetic diet  Filed Weights   02/24/19 2118 02/27/19 0436  Weight: 67.8 kg 71.2 kg    History of present illness:  78 year old woman PMH including diabetes mellitus type 2, nephrolithiasis, but no cardiac history, presented with abdominal pain, hematemesis, loss of consciousness and pulse via EMS requiring 15 to 20 seconds of CPR with spontaneous recovery.  Hemoglobin was unremarkable, rectal exam in the emergency department showed brown stool and Hemoccult was negative. CT of the abdomen pelvis showed 64mm renal stone with partial obstruction.   Hospital Course:  Urology was consulted with plans for cystoscopy once cleared by cardiology.  Patient was seen by cardiology and cleared for procedure, brief asystole reported was thought to be secondary to hypokalemia and hypomagnesemia.  She had a second episode this time in the emergency department of asystole that was quite brief and required brief CPR.  She was seen by cardiology again in follow-up with recommendation for aggressive electrolyte repletion.  Echocardiogram was noted to be reassuring and she was again cleared for procedure per cardiology.  She underwent stent placement 11/28.  She subsequently improved rapidly and was cleared for discharge home by urology with plans for definitive management in the outpatient setting after completion of antibiotics, 2 weeks recommended.  She was noted to have new right bundle branch block on EKG this was discussed with cardiology with plans made for close outpatient follow-up.  Individual issues as below.  Right-sided renal stone 28mm with associated pansensitive Escherichia coli urinary tract infection, gross hematuria, nausea and vomiting.  Status post stent placement 11/28 by urology. --Afebrile greater than 24 hours,  WBC trending down,  today 20.5.  --per urology complete 2 weeks of antibiotics.  Follow-up will be arranged by the service 2-3 weeks for definitive management of stone on the right side with ureteroscopic, laser lithotripsy and stent change --Continues on ceftriaxone, will discharge on Keflex  Asystole, cardiac arrest, 2 episodes.  Etiology unknown.  Cardiology felt secondary to electrolyte disturbance with hypokalemia and hypomagnesemia.  Once electrolytes were repleted she was cleared to proceed with surgery as she was felt to be low risk.  Echocardiogram was unremarkable per Dr. Chancy Milroy, official interpretation is pending.  Troponins were negative.  No further inpatient evaluation was recommended.  Reported hematemesis prior to admission.  Suspect Mallory-Weiss tear.  No further evaluation suggested.  Right bundle branch block new on EKG, incidental findings of aortic atherosclerosis and coronary artery calcifications on CT angiogram of the chest, suggesting coronary artery disease --Troponins were negative.  Discussed with Dr. Humphrey Rolls, okay for discharge, he will follow-up in the outpatient setting 12/1 for further investigation.  Hypokalemia, hypomagnesemia --Potassium and magnesium were aggressively repleted, borderline today. --Administer potassium and magnesium today and prescribed both on discharge --Telemetry sinus rhythm with no arrhythmias noted  Thrombocytopenia, likely secondary to acute infection, appears to be stable, recovering.  No further evaluation suggested.  Mild normocytic anemia of acute illness --Appears stable.  Follow-up as an outpatient.  Modestly elevated TSH --Follow-up as an outpatient.  Diabetes mellitus type 2, diet-controlled, with peripheral neuropathy --stable  Essential hypertension --Thiazide on discharge  GERD, large hiatal hernia --Continue Protonix on discharge  PMH includes: History of supraventricular tachycardia, peripheral  neuropathy  Resolved Hospital Problem list     Consults:   Cardiology  Urology  Procedures:   11/28 cystoscopy, right retrograde pyelogram with intraoperative interpretation, right ureteral stent placement  Significant Diagnostic Tests:   11/27 chest x-ray showed large hiatal hernia, no acute findings.  11/27 CT head no acute findings  11/27 CT angio chest, CT abdomen and pelvis with contrast.  No PE.  Coronary artery calcification suggesting coronary artery disease.  Large hiatal hernia.  29mm calculus upper pole right kidney resulting in mild upper pole caliectasis and obstruction  11/28 renal ultrasound: Right renal stone measuring 1.7 cm.  No associated hydronephrosis.  Normal left kidney.  11/28 echocardiogram, final report pending. Per Dr. Laurelyn Sickle note 11/28: "Echocardiogram on bedside preliminary report ejection fraction 59% with normal wall motion and no significant regurgitation fibrocalcified aortic valve without aortic stenosis. Since echo is unremarkable advise giving IV fluid to bring blood pressure above 100 and advise continuing KCl to bring potassium level to around 4.0. Since procedure is to be done by urology is urgent/emergent advise proceeding with procedure if it is okay with the medical team since white count is elevated. Cardiac point of view patient is cleared for urological procedure."  Micro Data:   SARS-CoV-2 negative  Urine culture pansensitive E. coli  Today's assessment: S: Feels well, no chest pain or shortness of breath.  No dysuria.  Wants to go home. O: Vitals:  Vitals:   02/26/19 1812 02/27/19 0436  BP: 125/61 (!) 120/57  Pulse: 82 73  Resp: (!) 25 (!) 21  Temp: 98.4 F (36.9 C) 98 F (36.7 C)  SpO2: 95% 96%    Constitutional.  Appears calm, comfortable. Respiratory.  Clear to auscultation bilaterally.  No wheezes, rales or rhonchi.  Normal respiratory effort. Cardiovascular.  Regular rate and rhythm.  No murmur, rub or  gallop.  No lower extremity edema. Psychiatric.  Sad, tearful remembering her  lost husband.  Speech fluent and clear.   Discharge Instructions  Discharge Instructions    Diet Carb Modified   Complete by: As directed    Discharge instructions   Complete by: As directed    Call your physician or seek immediate medical attention for weakness, fever, pain, shortness of breath, passing out, swelling, confusion, difficulty urinating, pain with urination, bleeding or worsening of condition.   Increase activity slowly   Complete by: As directed      Allergies as of 02/27/2019      Reactions   Codeine Nausea Only   Nausea and dizziness   Lisinopril Other (See Comments)   Weakness and dizziness   Morphine And Related Nausea And Vomiting   Zoloft [sertraline Hcl] Other (See Comments)   Dizziness      Medication List    TAKE these medications   calcium-vitamin D 500-200 MG-UNIT tablet Commonly known as: OSCAL WITH D Take 1 tablet by mouth daily.   cephALEXin 500 MG capsule Commonly known as: KEFLEX Take 1 capsule (500 mg total) by mouth 4 (four) times daily for 14 days.   docusate sodium 100 MG capsule Commonly known as: COLACE Take 100 mg by mouth 2 (two) times daily.   hydrochlorothiazide 12.5 MG tablet Commonly known as: HYDRODIURIL Take 12.5 mg by mouth daily.   magnesium oxide 400 (241.3 Mg) MG tablet Commonly known as: MAG-OX Take 1 tablet (400 mg total) by mouth 2 (two) times daily.   pantoprazole 40 MG tablet Commonly known as: PROTONIX Take 40 mg by mouth daily.   polyethylene glycol 17 g packet Commonly known as: MiraLax Take 17 g by mouth 2 (two) times daily.   potassium chloride SA 20 MEQ tablet Commonly known as: KLOR-CON Take 2 tablets (40 mEq total) by mouth daily.   vitamin B-12 1000 MCG tablet Commonly known as: CYANOCOBALAMIN Take 1,000 mcg by mouth daily.      Allergies  Allergen Reactions   Codeine Nausea Only    Nausea and dizziness    Lisinopril Other (See Comments)    Weakness and dizziness   Morphine And Related Nausea And Vomiting   Zoloft [Sertraline Hcl] Other (See Comments)    Dizziness    The results of significant diagnostics from this hospitalization (including imaging, microbiology, ancillary and laboratory) are listed below for reference.    Significant Diagnostic Studies: Abdomen 1 View (kub)  Result Date: 02/06/2019 CLINICAL DATA:  Follow-up kidney stones. No current symptoms. EXAM: ABDOMEN - 1 VIEW COMPARISON:  Abdomen and pelvis CT dated 04/19/2018 FINDINGS: Previously noted large hiatal hernia. 1.5 cm right renal calculus without significant change. Normal bowel gas pattern with mildly prominent stool. Thoracolumbar spine degenerative changes and mild scoliosis. IMPRESSION: 1. Stable 1.5 cm right renal calculus. 2. Stable large hiatal hernia. Electronically Signed   By: Claudie Revering M.D.   On: 02/06/2019 17:14   Ct Head Wo Contrast  Result Date: 02/24/2019 CLINICAL DATA:  Right-sided pain. Blood in urine. Blood in vomit. Altered consciousness. EXAM: CT HEAD WITHOUT CONTRAST TECHNIQUE: Contiguous axial images were obtained from the base of the skull through the vertex without intravenous contrast. COMPARISON:  None. FINDINGS: Brain: No subdural, epidural, or subarachnoid hemorrhage. Brainstem, basal cisterns, and cerebellum are normal. Ventricles and sulci are unremarkable. No mass effect or midline shift. No acute cortical ischemia or infarct. Scattered mild white matter changes are identified. Vascular: No hyperdense vessel or unexpected calcification. Skull: Normal. Negative for fracture or focal lesion. Sinuses/Orbits: No acute  finding. Other: None. IMPRESSION: 1. No acute intracranial abnormalities identified. Scattered white matter changes. Electronically Signed   By: Dorise Bullion III M.D   On: 02/24/2019 14:32   Ct Angio Chest Pe W And/or Wo Contrast  Result Date: 02/24/2019 CLINICAL DATA:  Acute  generalized abdominal pain. Chest pain. EXAM: CT ANGIOGRAPHY CHEST CT ABDOMEN AND PELVIS WITH CONTRAST TECHNIQUE: Multidetector CT imaging of the chest was performed using the standard protocol during bolus administration of intravenous contrast. Multiplanar CT image reconstructions and MIPs were obtained to evaluate the vascular anatomy. Multidetector CT imaging of the abdomen and pelvis was performed using the standard protocol during bolus administration of intravenous contrast. CONTRAST:  39mL OMNIPAQUE IOHEXOL 350 MG/ML SOLN COMPARISON:  April 19, 2018. February 10, 2008. FINDINGS: CTA CHEST FINDINGS Cardiovascular: Satisfactory opacification of the pulmonary arteries to the segmental level. No evidence of pulmonary embolism. Normal heart size. No pericardial effusion. Atherosclerosis of thoracic aorta is noted without aneurysm or dissection. Coronary artery calcifications are noted. Mediastinum/Nodes: Large sliding-type hiatal hernia is noted. Thyroid gland is unremarkable. No adenopathy is noted. Lungs/Pleura: Lungs are clear. No pleural effusion or pneumothorax. Musculoskeletal: No chest wall abnormality. No acute or significant osseous findings. Review of the MIP images confirms the above findings. CT ABDOMEN and PELVIS FINDINGS Hepatobiliary: Probable gallstones are noted. No gallbladder wall thickening or biliary dilatation is noted. The liver is unremarkable. Pancreas: Unremarkable. No pancreatic ductal dilatation or surrounding inflammatory changes. Spleen: Small splenic cyst is noted. No other abnormality is noted. Adrenals/Urinary Tract: Adrenal glands appear normal. Left kidney and ureter are unremarkable. Urinary bladder is unremarkable. 16 mm calculus is seen in upper pole of right kidney which appears to be within the infundibulum in resulting in mild upper pole caliectasis. Stomach/Bowel: Stomach is within normal limits. Appendix appears normal. No evidence of bowel wall thickening, distention,  or inflammatory changes. Sigmoid diverticulosis is noted without inflammation. Vascular/Lymphatic: Aortic atherosclerosis. No enlarged abdominal or pelvic lymph nodes. Reproductive: Status post hysterectomy. No adnexal masses. Other: No abdominal wall hernia or abnormality. No abdominopelvic ascites. Musculoskeletal: No acute or significant osseous findings. Review of the MIP images confirms the above findings. IMPRESSION: 1. No definite evidence of pulmonary embolus. 2. Coronary artery calcifications are noted suggesting coronary artery disease. 3. Large sliding-type hiatal hernia is noted. 4. Probable cholelithiasis without inflammation. 5. 16 mm calculus is seen in upper pole of right kidney which appears to be within the infundibulum in resulting in mild upper pole caliectasis and obstruction. 6. Sigmoid diverticulosis is noted without inflammation. 7. Aortic atherosclerosis. Aortic Atherosclerosis (ICD10-I70.0). Electronically Signed   By: Marijo Conception M.D.   On: 02/24/2019 14:40   Ct Abdomen Pelvis W Contrast  Result Date: 02/24/2019 CLINICAL DATA:  Acute generalized abdominal pain. Chest pain. EXAM: CT ANGIOGRAPHY CHEST CT ABDOMEN AND PELVIS WITH CONTRAST TECHNIQUE: Multidetector CT imaging of the chest was performed using the standard protocol during bolus administration of intravenous contrast. Multiplanar CT image reconstructions and MIPs were obtained to evaluate the vascular anatomy. Multidetector CT imaging of the abdomen and pelvis was performed using the standard protocol during bolus administration of intravenous contrast. CONTRAST:  75mL OMNIPAQUE IOHEXOL 350 MG/ML SOLN COMPARISON:  April 19, 2018. February 10, 2008. FINDINGS: CTA CHEST FINDINGS Cardiovascular: Satisfactory opacification of the pulmonary arteries to the segmental level. No evidence of pulmonary embolism. Normal heart size. No pericardial effusion. Atherosclerosis of thoracic aorta is noted without aneurysm or dissection.  Coronary artery calcifications are noted. Mediastinum/Nodes: Large sliding-type hiatal  hernia is noted. Thyroid gland is unremarkable. No adenopathy is noted. Lungs/Pleura: Lungs are clear. No pleural effusion or pneumothorax. Musculoskeletal: No chest wall abnormality. No acute or significant osseous findings. Review of the MIP images confirms the above findings. CT ABDOMEN and PELVIS FINDINGS Hepatobiliary: Probable gallstones are noted. No gallbladder wall thickening or biliary dilatation is noted. The liver is unremarkable. Pancreas: Unremarkable. No pancreatic ductal dilatation or surrounding inflammatory changes. Spleen: Small splenic cyst is noted. No other abnormality is noted. Adrenals/Urinary Tract: Adrenal glands appear normal. Left kidney and ureter are unremarkable. Urinary bladder is unremarkable. 16 mm calculus is seen in upper pole of right kidney which appears to be within the infundibulum in resulting in mild upper pole caliectasis. Stomach/Bowel: Stomach is within normal limits. Appendix appears normal. No evidence of bowel wall thickening, distention, or inflammatory changes. Sigmoid diverticulosis is noted without inflammation. Vascular/Lymphatic: Aortic atherosclerosis. No enlarged abdominal or pelvic lymph nodes. Reproductive: Status post hysterectomy. No adnexal masses. Other: No abdominal wall hernia or abnormality. No abdominopelvic ascites. Musculoskeletal: No acute or significant osseous findings. Review of the MIP images confirms the above findings. IMPRESSION: 1. No definite evidence of pulmonary embolus. 2. Coronary artery calcifications are noted suggesting coronary artery disease. 3. Large sliding-type hiatal hernia is noted. 4. Probable cholelithiasis without inflammation. 5. 16 mm calculus is seen in upper pole of right kidney which appears to be within the infundibulum in resulting in mild upper pole caliectasis and obstruction. 6. Sigmoid diverticulosis is noted without  inflammation. 7. Aortic atherosclerosis. Aortic Atherosclerosis (ICD10-I70.0). Electronically Signed   By: Marijo Conception M.D.   On: 02/24/2019 14:40   US Renal  Result Date: 02/25/2019 CLINICAL DATA:  Renal stone EXAM: RENAL / URINARY TRACT ULTRASOUND COMPLETE COMPARISON:  None. FINDINGS: Right Kidney: Renal measurements: 10.5 x 4.7 x 5.0 cm = volume: 131 mL. RIGHT renal stone measuring 1.7 cm. No hydronephrosis. Cortical echogenicity is within normal limits. Left Kidney: Renal measurements: 10.9 x 4.7 x 4.8 cm = volume: 130 mL. Echogenicity within normal limits. No mass or hydronephrosis visualized. No renal stones seen. Bladder: Appears normal for degree of bladder distention. Other: None. IMPRESSION: 1. RIGHT renal stone measuring 1.7 cm. No associated hydronephrosis. 2. Normal LEFT kidney. Electronically Signed   By: Franki Cabot M.D.   On: 02/25/2019 09:00   Dg Chest Portable 1 View  Result Date: 02/24/2019 CLINICAL DATA:  Shortness of breath EXAM: PORTABLE CHEST 1 VIEW COMPARISON:  12/16/2015, 02/10/2008 FINDINGS: Heart size appears mildly enlarged. Atherosclerotic calcification of the aortic knob. There is a large retrocardiac hiatal hernia. No focal airspace consolidation, pleural effusion, or pneumothorax. No acute osseous findings. IMPRESSION: 1. Large hiatal hernia. 2. No acute cardiopulmonary findings. 3. Mild cardiomegaly. Electronically Signed   By: Davina Poke M.D.   On: 02/24/2019 12:49   Dg Or Urology Cysto Image ([facility] Only)  Result Date: 02/25/2019 There is no interpretation for this exam.  This order is for images obtained during a surgical procedure.  Please See "Surgeries" Tab for more information regarding the procedure.    Microbiology: Recent Results (from the past 240 hour(s))  Urine culture     Status: Abnormal   Collection Time: 02/24/19  3:25 PM   Specimen: Urine, Random  Result Value Ref Range Status   Specimen Description   Final    URINE,  RANDOM Performed at [facility], [address] [place], [place] [address]    Special Requests   Final    NONE  Performed at [facility], [address] [place], [place] [address]    Culture >=100,000 COLONIES/mL ESCHERICHIA COLI (A)  Final   Report Status 02/26/2019 FINAL  Final   Organism ID, Bacteria ESCHERICHIA COLI (A)  Final      Susceptibility   Escherichia coli - MIC*    AMPICILLIN 8 SENSITIVE Sensitive     CEFAZOLIN <=4 SENSITIVE Sensitive     CEFTRIAXONE <=1 SENSITIVE Sensitive     CIPROFLOXACIN <=0.25 SENSITIVE Sensitive     GENTAMICIN <=1 SENSITIVE Sensitive     IMIPENEM <=0.25 SENSITIVE Sensitive     NITROFURANTOIN <=16 SENSITIVE Sensitive     TRIMETH/SULFA <=20 SENSITIVE Sensitive     AMPICILLIN/SULBACTAM <=2 SENSITIVE Sensitive     PIP/TAZO <=4 SENSITIVE Sensitive     Extended ESBL NEGATIVE Sensitive     * >=100,000 COLONIES/mL ESCHERICHIA COLI  SARS Coronavirus 2 by RT PCR (hospital order, performed in [place] hospital lab) Nasopharyngeal Nasopharyngeal Swab     Status: None   Collection Time: 02/24/19  3:25 PM   Specimen: Nasopharyngeal Swab  Result Value Ref Range Status   SARS Coronavirus 2 NEGATIVE NEGATIVE Final    Comment: (NOTE) SARS-CoV-2 target nucleic acids are NOT DETECTED. The SARS-CoV-2 RNA is generally detectable in upper and lower respiratory specimens during the acute phase of infection. The lowest concentration of SARS-CoV-2 viral copies this assay can detect is 250 copies / mL. A negative result does not preclude SARS-CoV-2 infection and should not be used as the sole basis for treatment or other patient management decisions.  A negative result may occur with improper specimen collection / handling, submission of specimen other than nasopharyngeal swab, presence of viral mutation(s) within the areas targeted by this assay, and inadequate number of viral copies (<250 copies / mL). A negative result must be combined  with clinical observations, patient history, and epidemiological information. Fact Sheet for Patients:   StrictlyIdeas.no Fact Sheet for Healthcare Providers: BankingDealers.co.za This test is not yet approved or cleared  by the [place] FDA and has been authorized for detection and/or diagnosis of SARS-CoV-2 by FDA under an Emergency Use Authorization (EUA).  This EUA will remain in effect (meaning this test can be used) for the duration of the COVID-19 declaration under Section 564(b)(1) of the Act, 21 U.S.C. section 360bbb-3(b)(1), unless the authorization is terminated or revoked sooner. Performed at [facility], [address] [place], [place] [address]   MRSA PCR Screening     Status: None   Collection Time: 02/24/19  9:20 PM   Specimen: Nasopharyngeal  Result Value Ref Range Status   MRSA by PCR NEGATIVE NEGATIVE Final    Comment:        The GeneXpert MRSA Assay (FDA approved for NASAL specimens only), is one component of a comprehensive MRSA colonization surveillance program. It is not intended to diagnose MRSA infection nor to guide or monitor treatment for MRSA infections. Performed at [facility], [address], [place], [place] [address]   Surgical pcr screen     Status: None   Collection Time: 02/25/19  8:37 AM   Specimen: Nasal Mucosa; Nasal Swab  Result Value Ref Range Status   MRSA, PCR NEGATIVE NEGATIVE Final   Staphylococcus aureus NEGATIVE NEGATIVE Final    Comment: (NOTE) The Xpert SA Assay (FDA approved for NASAL specimens in patients 54 years of age and older), is one component of a comprehensive surveillance program. It is not intended to diagnose infection  nor to guide or monitor treatment. Performed at [facility], [address] [place], [place] [address]      Labs: Basic Metabolic Panel: Recent Labs  Lab 02/24/19 1232 02/24/19 1354 02/25/19 0514  02/25/19 2000 02/26/19 0510 02/27/19 0546  NA 137  --  139 138 138 139  K 2.7*  --  3.6 3.1* 3.7 3.5  CL 105  --  109 109 110 110  CO2 21*  --  22 18* 18* 21*  GLUCOSE 168*  --  112* 139* 123* 97  BUN 18  --  17 16 18 21   CREATININE 0.87  --  0.86 0.91 0.79 0.69  CALCIUM 8.8*  --  8.4* 7.7* 8.1* 8.2*  MG  --  1.4* 2.1  --  1.8 1.9   Liver Function Tests: Recent Labs  Lab 02/24/19 1232 02/25/19 0514 02/26/19 0510 02/27/19 0546  AST 18 16 18  14*  ALT 11 13 12 10   ALKPHOS 70 57 69 66  BILITOT 2.2* 1.4* 1.1 1.0  PROT 6.3* 5.9* 5.5* 5.5*  ALBUMIN 3.5 3.0* 2.6* 2.5*   Recent Labs  Lab 02/24/19 1232  LIPASE 22   CBC: Recent Labs  Lab 02/24/19 1232 02/25/19 0514 02/26/19 0510 02/27/19 0546  WBC 7.6 26.7* 26.5* 20.5*  NEUTROABS 7.1  --   --   --   HGB 12.7 11.5* 10.2* 10.4*  HCT 36.7 31.8* 28.5* 29.0*  MCV 90.6 86.9 88.0 87.1  PLT 233 180 99* 104*    CBG: Recent Labs  Lab 02/24/19 2118 02/25/19 0734 02/25/19 1304 02/26/19 0731 02/27/19 0743  GLUCAP 139* 105* 116* 94 88    Principal Problem:   Cardiac arrest (HCC) Active Problems:   Diabetes (HCC)   HTN (hypertension)   Right renal stone   Nausea & vomiting   Syncope   Hypokalemia   Acute lower UTI   Time coordinating discharge: 45 minutes  Signed:  Murray Hodgkins, MD  [facility]  02/27/2019, 10:08 AM

## 2019-02-27 NOTE — Progress Notes (Signed)
OT Cancellation Note  Patient Details Name: Ashley Tanner MRN: [id number] DOB: 10/04/1940   Cancelled Treatment:    Reason Eval/Treat Not Completed: Patient declined, no reason specified. Consult received, chart reviewed. Upon attempt, pt politely declining an OT evaluation at this time given that she is currently preparing for discharge home. Discharge order noted in chart. RN entered pt's room and RN/OT/pt discussed current need for assist with ADL and benefits of OT. Pt agreeable to [facility] services. Case manager notified to set up. Will re-attempt acute OT evaluation as appropriate should pt not discharge home today.  Jeni Salles, MPH, MS, OTR/L ascom [phone] 02/27/19, 11:13 AM

## 2019-02-27 NOTE — Care Management Important Message (Signed)
Important Message  Patient Details  Name: Ashley Tanner MRN: [id number] Date of Birth: Nov 28, 1940   Medicare Important Message Given:  N/A - LOS <3 / Initial given by admissions  Initial Medicare IM given by patient access on 11/29 @ 11:28 am.   Darius Bump Mckinsey Keagle 02/27/2019, 10:32 AM

## 2019-02-27 NOTE — TOC Transition Note (Signed)
Transition of Care [facility]) - CM/SW Discharge Note   Patient Details  Name: Ashley Tanner MRN: [id number] Date of Birth: Jul 27, 1940  Transition of Care [facility]) CM/SW Contact:  Weston Anna, LCSW Phone Number: 02/27/2019, 12:53 PM   Clinical Narrative:     Patient set to discharge home with [place] provided [facility] services (PT/OT). No other needs at this time- CSW confirmed with Corene Cornea that referral was accepted.   Final next level of care: [place] Barriers to Discharge: No Barriers Identified   Patient Goals and CMS Choice Patient states their goals for this hospitalization and ongoing recovery are:: getting back home CMS Medicare.gov Compare Post Acute Care list provided to:: Patient Choice offered to / list presented to : Patient  Discharge Placement                    Patient and family notified of of transfer: 02/27/19  Discharge Plan and Services                DME Arranged: N/A         HH Arranged: PT, OT [place] Agency: [place] ([place]) Date [place]: 02/27/19 Time [place]: 1253 Representative spoke with at [place]: [place] ([place]) Interventions     Readmission Risk Interventions No flowsheet data found.

## 2019-02-28 ENCOUNTER — Other Ambulatory Visit: Payer: Self-pay | Admitting: Radiology

## 2019-02-28 DIAGNOSIS — N200 Calculus of kidney: Secondary | ICD-10-CM

## 2019-02-28 DIAGNOSIS — R002 Palpitations: Secondary | ICD-10-CM | POA: Diagnosis not present

## 2019-02-28 DIAGNOSIS — I10 Essential (primary) hypertension: Secondary | ICD-10-CM | POA: Diagnosis not present

## 2019-02-28 DIAGNOSIS — I4519 Other right bundle-branch block: Secondary | ICD-10-CM | POA: Diagnosis not present

## 2019-02-28 DIAGNOSIS — Z8674 Personal history of sudden cardiac arrest: Secondary | ICD-10-CM | POA: Diagnosis not present

## 2019-02-28 DIAGNOSIS — R0602 Shortness of breath: Secondary | ICD-10-CM | POA: Diagnosis not present

## 2019-02-28 DIAGNOSIS — I4439 Other atrioventricular block: Secondary | ICD-10-CM | POA: Diagnosis not present

## 2019-02-28 DIAGNOSIS — I2510 Atherosclerotic heart disease of native coronary artery without angina pectoris: Secondary | ICD-10-CM | POA: Diagnosis not present

## 2019-03-02 DIAGNOSIS — N200 Calculus of kidney: Secondary | ICD-10-CM | POA: Diagnosis not present

## 2019-03-02 DIAGNOSIS — R630 Anorexia: Secondary | ICD-10-CM | POA: Diagnosis not present

## 2019-03-02 DIAGNOSIS — I469 Cardiac arrest, cause unspecified: Secondary | ICD-10-CM | POA: Diagnosis not present

## 2019-03-03 DIAGNOSIS — I2510 Atherosclerotic heart disease of native coronary artery without angina pectoris: Secondary | ICD-10-CM | POA: Diagnosis not present

## 2019-03-03 DIAGNOSIS — Z8674 Personal history of sudden cardiac arrest: Secondary | ICD-10-CM | POA: Diagnosis not present

## 2019-03-03 DIAGNOSIS — I10 Essential (primary) hypertension: Secondary | ICD-10-CM | POA: Diagnosis not present

## 2019-03-03 DIAGNOSIS — R0602 Shortness of breath: Secondary | ICD-10-CM | POA: Diagnosis not present

## 2019-03-09 ENCOUNTER — Encounter
Admission: RE | Admit: 2019-03-09 | Discharge: 2019-03-09 | Disposition: A | Discharge status: Home / Self Care | Payer: PPO | Source: Ambulatory Visit | Attending: Urology | Admitting: Urology

## 2019-03-09 ENCOUNTER — Other Ambulatory Visit: Payer: Self-pay

## 2019-03-09 ENCOUNTER — Telehealth: Payer: Self-pay | Admitting: Urology

## 2019-03-09 DIAGNOSIS — Z01818 Encounter for other preprocedural examination: Secondary | ICD-10-CM | POA: Diagnosis not present

## 2019-03-09 DIAGNOSIS — I4510 Unspecified right bundle-branch block: Secondary | ICD-10-CM | POA: Diagnosis not present

## 2019-03-09 HISTORY — DX: Cardiac arrest, cause unspecified: I46.9

## 2019-03-09 HISTORY — DX: Personal history of urinary calculi: Z87.442

## 2019-03-09 LAB — BASIC METABOLIC PANEL
Anion gap: 9 (ref 5–15)
BUN: 16 mg/dL (ref 8–23)
CO2: 24 mmol/L (ref 22–32)
Calcium: 9.1 mg/dL (ref 8.9–10.3)
Chloride: 103 mmol/L (ref 98–111)
Creatinine, Ser: 0.85 mg/dL (ref 0.44–1.00)
GFR calc Af Amer: >60 mL/min (ref >60)
GFR calc non Af Amer: >60 mL/min (ref >60)
Glucose, Bld: 141 mg/dL — ABNORMAL HIGH (ref 70–99)
Potassium: 4.5 mmol/L (ref 3.5–5.1)
Sodium: 136 mmol/L (ref 135–145)

## 2019-03-09 LAB — CBC
HCT: 38.7 % (ref 36.0–46.0)
Hemoglobin: 13.2 g/dL (ref 12.0–15.0)
MCH: 30.7 pg (ref 26.0–34.0)
MCHC: 34.1 g/dL (ref 30.0–36.0)
MCV: 90 fL (ref 80.0–100.0)
Platelets: 467 10*3/uL — ABNORMAL HIGH (ref 150–400)
RBC: 4.3 MIL/uL (ref 3.87–5.11)
RDW: 12.8 % (ref 11.5–15.5)
WBC: 12.4 10*3/uL — ABNORMAL HIGH (ref 4.0–10.5)
nRBC: 0 % (ref 0.0–0.2)

## 2019-03-09 LAB — MAGNESIUM: Magnesium: 2.3 mg/dL (ref 1.7–2.4)

## 2019-03-09 NOTE — Pre-Procedure Instructions (Signed)
Patient seen by cardiology 11/28 prior to 1st procedure. Notes in Epic [facility] MD

## 2019-03-09 NOTE — Telephone Encounter (Signed)
Informed per Dr. Versie Starks to take Motrin or Tylenol for the pain. If pain worsens to notify the office. Patient voiced understanding.

## 2019-03-09 NOTE — Telephone Encounter (Signed)
Pt. States she is in a lot of pain and very nauseous and would like to have something for pain. She states her surgery is not until 03/17/19 and can not wait that long for pain medication.

## 2019-03-09 NOTE — Patient Instructions (Signed)
Your procedure is scheduled on: Friday 03/17/19 Report to [facility]. To find out your arrival time please call [phone] between 1PM - 3PM on Thursday 03/16/19.  Remember: Instructions that are not followed completely may result in serious medical risk, up to and including death, or upon the discretion of your surgeon and anesthesiologist your surgery may need to be rescheduled.     _X__ 1. Do not eat food after midnight the night before your procedure.                 No gum chewing or hard candies. You may drink clear liquids up to 2 hours                 before you are scheduled to arrive for your surgery- DO not drink clear                 liquids within 2 hours of the start of your surgery.                 Clear Liquids include:  water, apple juice without pulp, clear carbohydrate                 drink such as Clearfast or Gatorade, Black Coffee or Tea (Do not add                 anything to coffee or tea). Diabetics water only  __X__2.  On the morning of surgery brush your teeth with toothpaste and water, you                 may rinse your mouth with mouthwash if you wish.  Do not swallow any              toothpaste of mouthwash.     _X__ 3.  No Alcohol for 24 hours before or after surgery.   _X__ 4.  Do Not Smoke or use e-cigarettes For 24 Hours Prior to Your Surgery.                 Do not use any chewable tobacco products for at least 6 hours prior to                 surgery.  ____  5.  Bring all medications with you on the day of surgery if instructed.   __X__  6.  Notify your doctor if there is any change in your medical condition      (cold, fever, infections).     Do not wear jewelry, make-up, hairpins, clips or nail polish. Do not wear lotions, powders, or perfumes.  Do not shave 48 hours prior to surgery. Men may shave face and neck. Do not bring valuables to the hospital.    [facility] is not responsible for  any belongings or valuables.  Contacts, dentures/partials or body piercings may not be worn into surgery. Bring a case for your contacts, glasses or hearing aids, a denture cup will be supplied. Leave your suitcase in the car. After surgery it may be brought to your room. For patients admitted to the hospital, discharge time is determined by your treatment team.   Patients discharged the day of surgery will not be allowed to drive home.   Please read over the following fact sheets that you were given:   MRSA Information  __X__ Take these medicines the morning of surgery with A SIP OF WATER:  1. pantoprazole (PROTONIX) 40 MG tablet  2.   3.   4.  5.  6.  ____ Fleet Enema (as directed)   ____ Use CHG Soap/SAGE wipes as directed  ____ Use inhalers on the day of surgery  ____ Stop metformin/Janumet/Farxiga 2 days prior to surgery    ____ Take 1/2 of usual insulin dose the night before surgery. No insulin the morning          of surgery.   ____ Stop Blood Thinners Coumadin/Plavix/Xarelto/Pleta/Pradaxa/Eliquis/Effient/Aspirin  on   Or contact your Surgeon, Cardiologist or Medical Doctor regarding  ability to stop your blood thinners  __X__ Stop Anti-inflammatories 7 days before surgery such as Advil, Ibuprofen, Motrin,  BC or Goodies Powder, Naprosyn, Naproxen, Aleve, Aspirin    __X__ Stop all herbal supplements, fish oil or vitamin E until after surgery.    ____ Bring C-Pap to the hospital.

## 2019-03-10 LAB — URINALYSIS, ROUTINE W REFLEX MICROSCOPIC
Bilirubin Urine: NEGATIVE
Glucose, UA: NEGATIVE mg/dL
Ketones, ur: NEGATIVE mg/dL
Nitrite: NEGATIVE
Protein, ur: 100 mg/dL — AB
RBC / HPF: >50 RBC/hpf — ABNORMAL HIGH (ref 0–5)
Specific Gravity, Urine: 1.015 (ref 1.005–1.030)
pH: 6 (ref 5.0–8.0)

## 2019-03-11 LAB — URINE CULTURE: Culture: <10000 — AB

## 2019-03-14 ENCOUNTER — Other Ambulatory Visit
Admission: RE | Admit: 2019-03-14 | Discharge: 2019-03-14 | Disposition: A | Discharge status: Home / Self Care | Payer: PPO | Source: Ambulatory Visit | Attending: Urology | Admitting: Urology

## 2019-03-14 DIAGNOSIS — Z20828 Contact with and (suspected) exposure to other viral communicable diseases: Secondary | ICD-10-CM | POA: Diagnosis not present

## 2019-03-14 DIAGNOSIS — Z01812 Encounter for preprocedural laboratory examination: Secondary | ICD-10-CM | POA: Insufficient documentation

## 2019-03-14 LAB — SARS CORONAVIRUS 2 (TAT 6-24 HRS): SARS Coronavirus 2: NEGATIVE

## 2019-03-17 ENCOUNTER — Ambulatory Visit: Payer: PPO | Admitting: Anesthesiology

## 2019-03-17 ENCOUNTER — Ambulatory Visit: Payer: PPO

## 2019-03-17 ENCOUNTER — Other Ambulatory Visit: Payer: Self-pay

## 2019-03-17 ENCOUNTER — Encounter: Payer: Self-pay | Admitting: Urology

## 2019-03-17 ENCOUNTER — Ambulatory Visit
Admission: RE | Admit: 2019-03-17 | Discharge: 2019-03-17 | Disposition: A | Discharge status: Home / Self Care | Payer: PPO | Attending: Urology | Admitting: Urology

## 2019-03-17 ENCOUNTER — Encounter
Admission: RE | Disposition: A | Discharge status: Home / Self Care | Payer: Self-pay | Source: Home / Self Care | Attending: Urology

## 2019-03-17 DIAGNOSIS — I10 Essential (primary) hypertension: Secondary | ICD-10-CM | POA: Diagnosis not present

## 2019-03-17 DIAGNOSIS — K219 Gastro-esophageal reflux disease without esophagitis: Secondary | ICD-10-CM | POA: Diagnosis not present

## 2019-03-17 DIAGNOSIS — G629 Polyneuropathy, unspecified: Secondary | ICD-10-CM | POA: Insufficient documentation

## 2019-03-17 DIAGNOSIS — I252 Old myocardial infarction: Secondary | ICD-10-CM | POA: Diagnosis not present

## 2019-03-17 DIAGNOSIS — E1140 Type 2 diabetes mellitus with diabetic neuropathy, unspecified: Secondary | ICD-10-CM | POA: Diagnosis not present

## 2019-03-17 DIAGNOSIS — M1990 Unspecified osteoarthritis, unspecified site: Secondary | ICD-10-CM | POA: Diagnosis not present

## 2019-03-17 DIAGNOSIS — Z888 Allergy status to other drugs, medicaments and biological substances status: Secondary | ICD-10-CM | POA: Insufficient documentation

## 2019-03-17 DIAGNOSIS — F419 Anxiety disorder, unspecified: Secondary | ICD-10-CM | POA: Insufficient documentation

## 2019-03-17 DIAGNOSIS — Z885 Allergy status to narcotic agent status: Secondary | ICD-10-CM | POA: Insufficient documentation

## 2019-03-17 DIAGNOSIS — K449 Diaphragmatic hernia without obstruction or gangrene: Secondary | ICD-10-CM | POA: Insufficient documentation

## 2019-03-17 DIAGNOSIS — L409 Psoriasis, unspecified: Secondary | ICD-10-CM | POA: Diagnosis not present

## 2019-03-17 DIAGNOSIS — E119 Type 2 diabetes mellitus without complications: Secondary | ICD-10-CM | POA: Insufficient documentation

## 2019-03-17 DIAGNOSIS — Z466 Encounter for fitting and adjustment of urinary device: Secondary | ICD-10-CM | POA: Diagnosis not present

## 2019-03-17 DIAGNOSIS — N200 Calculus of kidney: Secondary | ICD-10-CM

## 2019-03-17 DIAGNOSIS — Z90710 Acquired absence of both cervix and uterus: Secondary | ICD-10-CM | POA: Insufficient documentation

## 2019-03-17 DIAGNOSIS — Z87442 Personal history of urinary calculi: Secondary | ICD-10-CM | POA: Insufficient documentation

## 2019-03-17 HISTORY — PX: CYSTOSCOPY/URETEROSCOPY/HOLMIUM LASER/STENT PLACEMENT: SHX6546

## 2019-03-17 LAB — GLUCOSE, CAPILLARY
Glucose-Capillary: 112 mg/dL — ABNORMAL HIGH (ref 70–99)
Glucose-Capillary: 116 mg/dL — ABNORMAL HIGH (ref 70–99)

## 2019-03-17 SURGERY — CYSTOSCOPY/URETEROSCOPY/HOLMIUM LASER/STENT PLACEMENT
Anesthesia: General | Site: Ureter | Laterality: Right

## 2019-03-17 MED ORDER — HYDROCODONE-ACETAMINOPHEN 5-325 MG PO TABS
ORAL_TABLET | ORAL | Status: AC
  Filled 2019-03-17: qty 1

## 2019-03-17 MED ORDER — ROCURONIUM BROMIDE 50 MG/5ML IV SOLN
INTRAVENOUS | Status: AC
  Filled 2019-03-17: qty 1

## 2019-03-17 MED ORDER — ONDANSETRON HCL 4 MG/2ML IJ SOLN
INTRAMUSCULAR | Status: DC | PRN
  Administered 2019-03-17: 4 mg via INTRAVENOUS

## 2019-03-17 MED ORDER — ONDANSETRON HCL 4 MG/2ML IJ SOLN: 4.0000 mg | Freq: Once | INTRAMUSCULAR | Status: DC

## 2019-03-17 MED ORDER — FENTANYL CITRATE (PF) 100 MCG/2ML IJ SOLN
INTRAMUSCULAR | Status: AC
  Administered 2019-03-17: 25 ug via INTRAVENOUS
  Filled 2019-03-17: qty 2

## 2019-03-17 MED ORDER — BELLADONNA ALKALOIDS-OPIUM 16.2-60 MG RE SUPP
RECTAL | Status: DC | PRN
  Administered 2019-03-17: 1 via RECTAL

## 2019-03-17 MED ORDER — SULFAMETHOXAZOLE-TRIMETHOPRIM 800-160 MG PO TABS: 1.0000 | ORAL_TABLET | Freq: Every day | ORAL | 0 refills | Status: DC

## 2019-03-17 MED ORDER — LACTATED RINGERS IV SOLN: INTRAVENOUS | Status: DC

## 2019-03-17 MED ORDER — BELLADONNA ALKALOIDS-OPIUM 16.2-60 MG RE SUPP
RECTAL | Status: AC
  Filled 2019-03-17: qty 1

## 2019-03-17 MED ORDER — LIDOCAINE HCL (CARDIAC) PF 100 MG/5ML IV SOSY
PREFILLED_SYRINGE | INTRAVENOUS | Status: DC | PRN
  Administered 2019-03-17: 80 mg via INTRAVENOUS

## 2019-03-17 MED ORDER — DEXAMETHASONE SODIUM PHOSPHATE 10 MG/ML IJ SOLN
INTRAMUSCULAR | Status: AC
  Filled 2019-03-17: qty 1

## 2019-03-17 MED ORDER — TAMSULOSIN HCL 0.4 MG PO CAPS: 0.4000 mg | ORAL_CAPSULE | Freq: Every day | ORAL | 0 refills | Status: DC

## 2019-03-17 MED ORDER — CEFAZOLIN SODIUM-DEXTROSE 2-4 GM/100ML-% IV SOLN
INTRAVENOUS | Status: AC
  Filled 2019-03-17: qty 100

## 2019-03-17 MED ORDER — HYDROCODONE-ACETAMINOPHEN 5-325 MG PO TABS
1.0000 | ORAL_TABLET | ORAL | Status: DC | PRN
  Administered 2019-03-17: 1 via ORAL

## 2019-03-17 MED ORDER — ROCURONIUM BROMIDE 100 MG/10ML IV SOLN
INTRAVENOUS | Status: DC | PRN
  Administered 2019-03-17: 50 mg via INTRAVENOUS

## 2019-03-17 MED ORDER — ONDANSETRON HCL 4 MG/2ML IJ SOLN
INTRAMUSCULAR | Status: AC
  Filled 2019-03-17: qty 2

## 2019-03-17 MED ORDER — SUGAMMADEX SODIUM 200 MG/2ML IV SOLN
INTRAVENOUS | Status: DC | PRN
  Administered 2019-03-17: 120 mg via INTRAVENOUS

## 2019-03-17 MED ORDER — CEFAZOLIN SODIUM-DEXTROSE 2-4 GM/100ML-% IV SOLN
2.0000 g | INTRAVENOUS | Status: AC
  Administered 2019-03-17: 2 g via INTRAVENOUS

## 2019-03-17 MED ORDER — DEXAMETHASONE SODIUM PHOSPHATE 10 MG/ML IJ SOLN
INTRAMUSCULAR | Status: DC | PRN
  Administered 2019-03-17: 10 mg via INTRAVENOUS

## 2019-03-17 MED ORDER — FENTANYL CITRATE (PF) 100 MCG/2ML IJ SOLN
INTRAMUSCULAR | Status: AC
  Filled 2019-03-17: qty 2

## 2019-03-17 MED ORDER — IOHEXOL 180 MG/ML  SOLN
INTRAMUSCULAR | Status: DC | PRN
  Administered 2019-03-17: 11:00:00 20 mL

## 2019-03-17 MED ORDER — PHENYLEPHRINE HCL (PRESSORS) 10 MG/ML IV SOLN
INTRAVENOUS | Status: DC | PRN
  Administered 2019-03-17 (×4): 100 ug via INTRAVENOUS

## 2019-03-17 MED ORDER — PROPOFOL 10 MG/ML IV BOLUS
INTRAVENOUS | Status: DC | PRN
  Administered 2019-03-17: 100 mg via INTRAVENOUS

## 2019-03-17 MED ORDER — SUGAMMADEX SODIUM 200 MG/2ML IV SOLN
INTRAVENOUS | Status: AC
  Filled 2019-03-17: qty 2

## 2019-03-17 MED ORDER — FENTANYL CITRATE (PF) 100 MCG/2ML IJ SOLN
INTRAMUSCULAR | Status: DC | PRN
  Administered 2019-03-17: 75 ug via INTRAVENOUS

## 2019-03-17 MED ORDER — HYDROCODONE-ACETAMINOPHEN 5-325 MG PO TABS: 1.0000 | ORAL_TABLET | ORAL | 0 refills | Status: AC | PRN

## 2019-03-17 MED ORDER — LIDOCAINE HCL (PF) 2 % IJ SOLN
INTRAMUSCULAR | Status: AC
  Filled 2019-03-17: qty 5

## 2019-03-17 MED ORDER — PROPOFOL 10 MG/ML IV BOLUS
INTRAVENOUS | Status: AC
  Filled 2019-03-17: qty 20

## 2019-03-17 MED ORDER — FENTANYL CITRATE (PF) 100 MCG/2ML IJ SOLN
25.0000 ug | INTRAMUSCULAR | Status: DC | PRN
  Administered 2019-03-17 (×3): 25 ug via INTRAVENOUS

## 2019-03-17 SURGICAL SUPPLY — 30 items
BAG DRAIN CYSTO-URO LG1000N (MISCELLANEOUS) ×3 IMPLANT
BRUSH SCRUB EZ 1% IODOPHOR (MISCELLANEOUS) ×3 IMPLANT
CATH FOL 2WAY LX 16X30 (CATHETERS) ×2 IMPLANT
CATH URETL 5X70 OPEN END (CATHETERS) ×2 IMPLANT
CNTNR SPEC 2.5X3XGRAD LEK (MISCELLANEOUS)
CONT SPEC 4OZ STER OR WHT (MISCELLANEOUS)
CONTAINER SPEC 2.5X3XGRAD LEK (MISCELLANEOUS) IMPLANT
DRAPE UTILITY 15X26 TOWEL STRL (DRAPES) ×3 IMPLANT
FIBER LASER TRAC TIP (UROLOGICAL SUPPLIES) ×2 IMPLANT
GLOVE BIOGEL PI IND STRL 7.5 (GLOVE) ×1 IMPLANT
GLOVE BIOGEL PI INDICATOR 7.5 (GLOVE) ×2
GOWN STRL REUS W/ TWL LRG LVL3 (GOWN DISPOSABLE) ×1 IMPLANT
GOWN STRL REUS W/ TWL XL LVL3 (GOWN DISPOSABLE) ×1 IMPLANT
GOWN STRL REUS W/TWL LRG LVL3 (GOWN DISPOSABLE) ×2
GOWN STRL REUS W/TWL XL LVL3 (GOWN DISPOSABLE) ×2
GUIDEWIRE STR DUAL SENSOR (WIRE) ×3 IMPLANT
INFUSOR MANOMETER BAG 3000ML (MISCELLANEOUS) ×3 IMPLANT
INTRODUCER DILATOR DOUBLE (INTRODUCER) IMPLANT
KIT TURNOVER CYSTO (KITS) ×3 IMPLANT
PACK CYSTO AR (MISCELLANEOUS) ×3 IMPLANT
SET CYSTO W/LG BORE CLAMP LF (SET/KITS/TRAYS/PACK) ×3 IMPLANT
SHEATH URETERAL 12FRX35CM (MISCELLANEOUS) ×2 IMPLANT
SOL .9 NS 3000ML IRR  AL (IV SOLUTION) ×2
SOL .9 NS 3000ML IRR UROMATIC (IV SOLUTION) ×1 IMPLANT
STENT URET 6FRX24 CONTOUR (STENTS) IMPLANT
STENT URET 6FRX26 CONTOUR (STENTS) ×2 IMPLANT
SURGILUBE 2OZ TUBE FLIPTOP (MISCELLANEOUS) ×3 IMPLANT
SYR 10ML LL (SYRINGE) ×3 IMPLANT
VALVE UROSEAL ADJ ENDO (VALVE) ×2 IMPLANT
WATER STERILE IRR 1000ML POUR (IV SOLUTION) ×3 IMPLANT

## 2019-03-17 NOTE — Op Note (Signed)
Date of procedure: 03/17/19  Preoperative diagnosis:  1. 1.5 cm right upper pole calyceal stone  Postoperative diagnosis:  1. Same  Procedure: 1. Cystoscopy, right retrograde pyelogram with intraoperative interpretation, right ureteroscopy, laser lithotripsy, stent placement  Surgeon: Nickolas Madrid, MD  Anesthesia: General  Complications: None  Intraoperative findings:  1.  Uncomplicated dusting of large upper pole stone 2.  Uncomplicated stent placement  EBL: Minimal  Specimens: None  Drains: Right 6 French by 26 cm ureteral stent  Indication: Ashley Tanner is a 78 y.o. patient who previously presented with sepsis secondary to a 1.5 cm right upper pole calyceal stone with upstream infection.  She underwent urgent stent placement at that time, and presents today for definitive management.  After reviewing the management options for treatment, they elected to proceed with the above surgical procedure(s). We have discussed the potential benefits and risks of the procedure, side effects of the proposed treatment, the likelihood of the patient achieving the goals of the procedure, and any potential problems that might occur during the procedure or recuperation. Informed consent has been obtained.  Description of procedure:  The patient was taken to the operating room and general anesthesia was induced. SCDs were placed for DVT prophylaxis. The patient was placed in the dorsal lithotomy position, prepped and draped in the usual sterile fashion, and preoperative antibiotics(Ancef) were administered. A preoperative time-out was performed.   A 21 French rigid cystoscope was used to intubate the urethra.  Cystoscopy was performed and the bladder was grossly normal.  A sensor wire was advanced alongside the right ureteral stent into the upper pole under fluoroscopic vision.  The stent was then grasped and pulled to the meatus, and a second safety sensor wire was passed into the upper pole  through the stent and the stent removed.  A 12/14 French by 35 cm ureteral access sheath was gently advanced over the wire under fluoroscopic vision to the proximal ureter.  A single-channel flexible ureteroscope was advanced through the sheath and I immediately identified a large black stone in the upper pole calyx.  Using a 200 m laser fiber on settings of 0.5 J and 20 Hz, this was methodically dusted to <1 mm fragments.  Thorough inspection of the kidney revealed no residual fragments at the conclusion of the procedure.  Contrast was injected through the scope for a retrograde pyelogram which showed no extravasation or filling defects.  Careful pullback ureteroscopy did not demonstrate any ureteral injury or residual fragments.  A 6 French by 26 cm stent was uneventfully placed over the wire under fluoroscopic vision with an excellent curl in the renal pelvis, as well as under direct vision in the bladder.  A Foley was placed to temporarily maximize drainage in the setting of her prior infection, and will be removed prior to discharge.  Disposition: Stable to PACU  Plan: Remove Foley prior to discharge Follow-up in 1 week for stent removal Prophylactic Bactrim while stent in place  Nickolas Madrid, MD

## 2019-03-17 NOTE — Anesthesia Postprocedure Evaluation (Signed)
Anesthesia Post Note  Patient: Ashley Tanner  Procedure(s) Performed: CYSTOSCOPY/URETEROSCOPY/HOLMIUM LASER/STENT Exchange (Right Ureter)  Patient location during evaluation: PACU Anesthesia Type: General Level of consciousness: awake and alert Pain management: pain level controlled Vital Signs Assessment: post-procedure vital signs reviewed and stable Respiratory status: spontaneous breathing, nonlabored ventilation and respiratory function stable Cardiovascular status: blood pressure returned to baseline and stable Postop Assessment: no apparent nausea or vomiting Anesthetic complications: no     Last Vitals:  Vitals:   03/17/19 1232 03/17/19 1311  BP: (!) 147/65 134/71  Pulse: 69 74  Resp: 16 16  Temp: 36.8 C   SpO2: 98% 99%    Last Pain:  Vitals:   03/17/19 1311  TempSrc:   PainSc: [place]

## 2019-03-17 NOTE — Discharge Instructions (Signed)

## 2019-03-17 NOTE — Transfer of Care (Signed)
Immediate Anesthesia Transfer of Care Note  Patient: Ashley Tanner  Procedure(s) Performed: CYSTOSCOPY/URETEROSCOPY/HOLMIUM LASER/STENT Exchange (Right Ureter)  Patient Location: PACU  Anesthesia Type:General  Level of Consciousness: awake, alert  and oriented  Airway & Oxygen Therapy: Patient Spontanous Breathing and Patient connected to face mask oxygen  Post-op Assessment: Report given to RN and Post -op Vital signs reviewed and stable  Post vital signs: Reviewed and stable  Last Vitals:  Vitals Value Taken Time  BP 144/73 03/17/19 1108  Temp    Pulse 75 03/17/19 1112  Resp 14 03/17/19 1112  SpO2 100 % 03/17/19 1112  Vitals shown include unvalidated device data.  Last Pain:  Vitals:   03/17/19 0850  TempSrc: Tympanic  PainSc: 0-No pain         Complications: No apparent anesthesia complications

## 2019-03-17 NOTE — Anesthesia Procedure Notes (Addendum)
Procedure Name: Intubation Date/Time: 03/17/2019 10:07 AM Performed by: Rona Ravens, CRNA Pre-anesthesia Checklist: Patient identified, Suction available, Emergency Drugs available, Timeout performed and Patient being monitored Patient Re-evaluated:Patient Re-evaluated prior to induction Oxygen Delivery Method: Circle system utilized Preoxygenation: Pre-oxygenation with 100% oxygen Induction Type: IV induction Ventilation: Mask ventilation without difficulty Laryngoscope Size: Mac and 3 Grade View: Grade II Tube type: Oral Tube size: 7.0 mm Number of attempts: 1 Airway Equipment and Method: Stylet Placement Confirmation: ETT inserted through vocal cords under direct vision,  positive ETCO2,  breath sounds checked- equal and bilateral and CO2 detector Secured at: 22 cm Tube secured with: Tape Dental Injury: Teeth and Oropharynx as per pre-operative assessment

## 2019-03-17 NOTE — Anesthesia Preprocedure Evaluation (Addendum)
Anesthesia Evaluation  Patient identified by MRN, date of birth, ID band Patient awake    Reviewed: Allergy & Precautions, H&P , NPO status , Patient's Chart, lab work & pertinent test results  Airway Mallampati: II  TM Distance: >3 FB Neck ROM: full    Dental  (+) Teeth Intact   Pulmonary neg pulmonary ROS, neg shortness of breath, neg COPD, neg recent URI, Not current smoker,           Cardiovascular hypertension, (-) angina(-) Past MI and (-) Cardiac Stents   H/o cardiac arrest Nov 2020 - asystole x 30 seconds in setting of electrolyte abnormalities  Echo 02/25/19: LVH, normal systolic function, no comment re: diastolic function.  Normal RV function, moderately elevated PA pressures   Neuro/Psych PSYCHIATRIC DISORDERS Anxiety Peripheral neuropathy    GI/Hepatic Neg liver ROS, hiatal hernia, GERD  Controlled,  Endo/Other  diabetes  Renal/GU      Musculoskeletal   Abdominal   Peds  Hematology negative hematology ROS (+)   Anesthesia Other Findings Past Medical History: No date: Arthritis No date: Asystole (HCC)     Comment:  electrolyte imbalance No date: B12 deficiency No date: Chronic diarrhea No date: Diabetes mellitus without complication (HCC)     Comment:  borderline No date: GERD (gastroesophageal reflux disease) No date: History of colon polyps No date: History of hiatal hernia No date: History of kidney stones No date: History of supraventricular tachycardia No date: Hoarseness of voice No date: Hypertension No date: Osteoarthritis No date: Other constipation No date: Peripheral neuropathy No date: Psoriasis  Past Surgical History: No date: ABDOMINAL HYSTERECTOMY 01/29/2003: BREAST BIOPSY; Left     Comment:  Fibrocystic changes, ductal adenosis,               microcalcifications 03/29/2017: BREAST BIOPSY; Left     Comment:  Affirm Bx- SUPERIOR, CENTRAL, POSTERIOR; STEREOTACTIC   BIOPSY: ORGANIZING FAT NECROSIS November 2004: BREAST SURGERY; Left     Comment:  Fibrocystic changes, ductal adenosis,               microcalcifications 2013: COLONOSCOPY     Comment:  Dr Vira Agar 10/29/2017: COLONOSCOPY WITH PROPOFOL; N/A     Comment:  Procedure: COLONOSCOPY WITH PROPOFOL;  Surgeon: Manya Silvas, MD;  Location: [facility] ENDOSCOPY;  Service:               Endoscopy;  Laterality: N/A; 02/25/2019: CYSTOSCOPY/URETEROSCOPY/HOLMIUM LASER/STENT PLACEMENT;  Right     Comment:  Procedure: CYSTOSCOPY/URETEROSCOPY//STENT PLACEMENT;                Surgeon: Billey Co, MD;  Location: [facility] ORS;                Service: Urology;  Laterality: Right; No date: ESOPHAGOGASTRODUODENOSCOPY No date: EYE SURGERY     Comment:  Cataract surgery No date: FLEXIBLE SIGMOIDOSCOPY March 2011: HERNIA REPAIR     Comment:  [facility], intrathoracic hiatal hernia 2014: KNEE ARTHROSCOPY; Left     Reproductive/Obstetrics negative OB ROS                           Anesthesia Physical Anesthesia Plan  ASA: III  Anesthesia Plan: General ETT   Post-op Pain Management:    Induction: Intravenous  PONV Risk Score and Plan: Ondansetron, Dexamethasone and Treatment may vary due to age or medical condition  Airway Management  Planned: Oral ETT  Additional Equipment:   Intra-op Plan:   Post-operative Plan: Extubation in OR  Informed Consent: I have reviewed the patients History and Physical, chart, labs and discussed the procedure including the risks, benefits and alternatives for the proposed anesthesia with the patient or authorized representative who has indicated his/her understanding and acceptance.     Dental Advisory Given  Plan Discussed with: Anesthesiologist  Anesthesia Plan Comments:        Anesthesia Quick Evaluation

## 2019-03-17 NOTE — Anesthesia Post-op Follow-up Note (Signed)
Anesthesia QCDR form completed.        

## 2019-03-17 NOTE — H&P (Signed)
UROLOGY H&P UPDATE  Agree with prior H&P dated 02/25/19. Right 1.6cm obstructing upper pole stone with prior infection, previously stented.  Cardiac: RRR Lungs: CTA bilaterally  Laterality: Right Procedure: Right ureteroscopy, laser lithotripsy, stent placement  Urine: urine cx 12/11 no growth  We specifically discussed the risks ureteroscopy including bleeding, infection/sepsis, stent related symptoms including flank pain/urgency/frequency/incontinence/dysuria, ureteral injury, inability to access stone, or need for staged or additional procedures.   Billey Co, MD 03/17/2019

## 2019-03-30 ENCOUNTER — Other Ambulatory Visit: Payer: Self-pay

## 2019-03-30 ENCOUNTER — Encounter: Payer: Self-pay | Admitting: Urology

## 2019-03-30 ENCOUNTER — Ambulatory Visit (INDEPENDENT_AMBULATORY_CARE_PROVIDER_SITE_OTHER): Payer: PPO | Admitting: Urology

## 2019-03-30 VITALS — BP 113/74 | HR 61 | Ht 63.0 in | Wt 142.0 lb

## 2019-03-30 DIAGNOSIS — N200 Calculus of kidney: Secondary | ICD-10-CM | POA: Diagnosis not present

## 2019-03-30 LAB — URINALYSIS, COMPLETE
Bilirubin, UA: NEGATIVE
Glucose, UA: NEGATIVE
Ketones, UA: NEGATIVE
Nitrite, UA: NEGATIVE
Specific Gravity, UA: 1.025 (ref 1.005–1.030)
Urobilinogen, Ur: 0.2 mg/dL (ref 0.2–1.0)
pH, UA: 5 (ref 5.0–7.5)

## 2019-03-30 LAB — MICROSCOPIC EXAMINATION

## 2019-03-30 MED ORDER — CIPROFLOXACIN HCL 500 MG PO TABS
500.0000 mg | ORAL_TABLET | Freq: Once | ORAL | Status: AC
  Administered 2019-03-30: 500 mg via ORAL

## 2019-03-30 NOTE — Progress Notes (Signed)
Cystoscopy Procedure Note:  Indication: Stent removal s/p 12/18 right URS/laser lithotripsy for 2 cm obstructing upper pole stone  After informed consent and discussion of the procedure and its risks, Ashley Tanner was positioned and prepped in the standard fashion. Cystoscopy was performed with a flexible cystoscope. The stent was grasped with flexible graspers and removed in its entirety. The patient tolerated the procedure well.  Findings: Uncomplicated stent removal  Assessment and Plan: Follow up in 4 weeks with renal ultrasound to evaluate for silent hydronephrosis  We discussed general stone prevention strategies including adequate hydration with goal of producing 2.5 L of urine daily, increasing citric acid intake, increasing calcium intake during high oxalate meals, minimizing animal protein, and decreasing salt intake. Information about dietary recommendations given today.     Billey Co, MD 03/30/2019

## 2019-04-04 DIAGNOSIS — E782 Mixed hyperlipidemia: Secondary | ICD-10-CM | POA: Diagnosis not present

## 2019-04-04 DIAGNOSIS — I2510 Atherosclerotic heart disease of native coronary artery without angina pectoris: Secondary | ICD-10-CM | POA: Diagnosis not present

## 2019-04-04 DIAGNOSIS — R0602 Shortness of breath: Secondary | ICD-10-CM | POA: Diagnosis not present

## 2019-04-04 DIAGNOSIS — I10 Essential (primary) hypertension: Secondary | ICD-10-CM | POA: Diagnosis not present

## 2019-04-28 ENCOUNTER — Other Ambulatory Visit: Payer: Self-pay | Admitting: Family Medicine

## 2019-04-28 DIAGNOSIS — Z1231 Encounter for screening mammogram for malignant neoplasm of breast: Secondary | ICD-10-CM

## 2019-05-19 ENCOUNTER — Ambulatory Visit: Payer: PPO

## 2019-05-23 DIAGNOSIS — I10 Essential (primary) hypertension: Secondary | ICD-10-CM | POA: Diagnosis not present

## 2019-05-23 DIAGNOSIS — R7303 Prediabetes: Secondary | ICD-10-CM | POA: Diagnosis not present

## 2019-05-24 ENCOUNTER — Ambulatory Visit: Payer: Self-pay | Admitting: Urology

## 2019-05-30 DIAGNOSIS — Z136 Encounter for screening for cardiovascular disorders: Secondary | ICD-10-CM | POA: Diagnosis not present

## 2019-05-30 DIAGNOSIS — I10 Essential (primary) hypertension: Secondary | ICD-10-CM | POA: Diagnosis not present

## 2019-05-30 DIAGNOSIS — F418 Other specified anxiety disorders: Secondary | ICD-10-CM | POA: Diagnosis not present

## 2019-05-30 DIAGNOSIS — R7303 Prediabetes: Secondary | ICD-10-CM | POA: Diagnosis not present

## 2019-06-01 ENCOUNTER — Other Ambulatory Visit: Payer: Self-pay

## 2019-06-01 ENCOUNTER — Ambulatory Visit
Admission: RE | Admit: 2019-06-01 | Discharge: 2019-06-01 | Disposition: A | Discharge status: Home / Self Care | Payer: PPO | Source: Ambulatory Visit | Attending: Urology | Admitting: Urology

## 2019-06-01 DIAGNOSIS — N200 Calculus of kidney: Secondary | ICD-10-CM

## 2019-06-07 ENCOUNTER — Ambulatory Visit
Admission: RE | Admit: 2019-06-07 | Discharge: 2019-06-07 | Disposition: A | Discharge status: Home / Self Care | Payer: PPO | Source: Ambulatory Visit | Attending: Family Medicine | Admitting: Family Medicine

## 2019-06-07 DIAGNOSIS — Z1231 Encounter for screening mammogram for malignant neoplasm of breast: Secondary | ICD-10-CM

## 2019-06-15 ENCOUNTER — Other Ambulatory Visit: Payer: Self-pay

## 2019-06-15 ENCOUNTER — Ambulatory Visit: Payer: PPO | Admitting: Urology

## 2019-06-15 VITALS — BP 130/77 | HR 51 | Ht 63.0 in | Wt 143.0 lb

## 2019-06-15 DIAGNOSIS — N200 Calculus of kidney: Secondary | ICD-10-CM | POA: Diagnosis not present

## 2019-06-15 NOTE — Patient Instructions (Signed)
Dietary Guidelines to Help Prevent Kidney Stones Kidney stones are deposits of minerals and salts that form inside your kidneys. Your risk of developing kidney stones may be greater depending on your diet, your lifestyle, the medicines you take, and whether you have certain medical conditions. Most people can reduce their chances of developing kidney stones by following the instructions below. Depending on your overall health and the type of kidney stones you tend to develop, your dietitian may give you more specific instructions. What are tips for following this plan? Reading food labels  Choose foods with "no salt added" or "low-salt" labels. Limit your sodium intake to less than 1500 mg per day.  Choose foods with calcium for each meal and snack. Try to eat about 300 mg of calcium at each meal. Foods that contain 200-500 mg of calcium per serving include: ? 8 oz (237 ml) of milk, fortified nondairy milk, and fortified fruit juice. ? 8 oz (237 ml) of kefir, yogurt, and soy yogurt. ? 4 oz (118 ml) of tofu. ? 1 oz of cheese. ? 1 cup (300 g) of dried figs. ? 1 cup (91 g) of cooked broccoli. ? 1-3 oz can of sardines or mackerel.  Most people need 1000 to 1500 mg of calcium each day. Talk to your dietitian about how much calcium is recommended for you. Shopping  Buy plenty of fresh fruits and vegetables. Most people do not need to avoid fruits and vegetables, even if they contain nutrients that may contribute to kidney stones.  When shopping for convenience foods, choose: ? Whole pieces of fruit. ? Premade salads with dressing on the side. ? Low-fat fruit and yogurt smoothies.  Avoid buying frozen meals or prepared deli foods.  Look for foods with live cultures, such as yogurt and kefir. Cooking  Do not add salt to food when cooking. Place a salt shaker on the table and allow each person to add his or her own salt to taste.  Use vegetable protein, such as beans, textured vegetable  protein (TVP), or tofu instead of meat in pasta, casseroles, and soups. Meal planning   Eat less salt, if told by your dietitian. To do this: ? Avoid eating processed or premade food. ? Avoid eating fast food.  Eat less animal protein, including cheese, meat, poultry, or fish, if told by your dietitian. To do this: ? Limit the number of times you have meat, poultry, fish, or cheese each week. Eat a diet free of meat at least 2 days a week. ? Eat only one serving each day of meat, poultry, fish, or seafood. ? When you prepare animal protein, cut pieces into small portion sizes. For most meat and fish, one serving is about the size of one deck of cards.  Eat at least 5 servings of fresh fruits and vegetables each day. To do this: ? Keep fruits and vegetables on hand for snacks. ? Eat 1 piece of fruit or a handful of berries with breakfast. ? Have a salad and fruit at lunch. ? Have two kinds of vegetables at dinner.  Limit foods that are high in a substance called oxalate. These include: ? Spinach. ? Rhubarb. ? Beets. ? Potato chips and french fries. ? Nuts.  If you regularly take a diuretic medicine, make sure to eat at least 1-2 fruits or vegetables high in potassium each day. These include: ? Avocado. ? Banana. ? Orange, prune, carrot, or tomato juice. ? Baked potato. ? Cabbage. ? Beans and split   peas. General instructions   Drink enough fluid to keep your urine clear or pale yellow. This is the most important thing you can do.  Talk to your health care provider and dietitian about taking daily supplements. Depending on your health and the cause of your kidney stones, you may be advised: ? Not to take supplements with vitamin C. ? To take a calcium supplement. ? To take a daily probiotic supplement. ? To take other supplements such as magnesium, fish oil, or vitamin B6.  Take all medicines and supplements as told by your health care provider.  Limit alcohol intake to no  more than 1 drink a day for nonpregnant women and 2 drinks a day for men. One drink equals 12 oz of beer, 5 oz of wine, or 1 oz of hard liquor.  Lose weight if told by your health care provider. Work with your dietitian to find strategies and an eating plan that works best for you. What foods are not recommended? Limit your intake of the following foods, or as told by your dietitian. Talk to your dietitian about specific foods you should avoid based on the type of kidney stones and your overall health. Grains Breads. Bagels. Rolls. Baked goods. Salted crackers. Cereal. Pasta. Vegetables Spinach. Rhubarb. Beets. Canned vegetables. Pickles. Olives. Meats and other protein foods Nuts. Nut butters. Large portions of meat, poultry, or fish. Salted or cured meats. Deli meats. Hot dogs. Sausages. Dairy Cheese. Beverages Regular soft drinks. Regular vegetable juice. Seasonings and other foods Seasoning blends with salt. Salad dressings. Canned soups. Soy sauce. Ketchup. Barbecue sauce. Canned pasta sauce. Casseroles. Pizza. Lasagna. Frozen meals. Potato chips. French fries. Summary  You can reduce your risk of kidney stones by making changes to your diet.  The most important thing you can do is drink enough fluid. You should drink enough fluid to keep your urine clear or pale yellow.  Ask your health care provider or dietitian how much protein from animal sources you should eat each day, and also how much salt and calcium you should have each day. This information is not intended to replace advice given to you by your health care provider. Make sure you discuss any questions you have with your health care provider. Document Revised: 07/06/2018 Document Reviewed: 02/25/2016 Elsevier Patient Education  2020 [employer]  

## 2019-06-15 NOTE — Progress Notes (Signed)
   06/15/2019 11:17 AM   Ashley Tanner 1941-03-09 [id number]  Reason for visit: Follow up nephrolithiasis  HPI: I saw Ms. Ashley Tanner back in urology clinic for follow-up of nephrolithiasis.  She is a 79 year old female that originally presented in late November with an infected obstructing right 1.6 cm upper pole infundibular stone with some cardiac complications and underwent emergent stent placement at that time.  She underwent follow-up ureteroscopy and laser lithotripsy on 03/17/2019 with dusting of her large upper pole stone in a upper pole calyx.  She had no postoperative complications and her stent has since been removed.  She denies any gross hematuria or flank pain and is doing well.  Follow-up renal ultrasound on 06/01/2019 showed no evidence of hydronephrosis and there was a possible small non-obstructing right-sided renal stone.  We discussed general stone prevention strategies including adequate hydration with goal of producing 2.5 L of urine daily, increasing citric acid intake, increasing calcium intake during high oxalate meals, minimizing animal protein, and decreasing salt intake. Information about dietary recommendations given today.   RTC 9 months with [facility] prior  I spent 20 total minutes on the day of the encounter including pre-visit review of the medical record, face-to-face time with the patient, and post visit ordering of labs/imaging/tests.  Billey Co, [facility] [address] [place], [place] [address] [phone]

## 2019-07-04 DIAGNOSIS — I469 Cardiac arrest, cause unspecified: Secondary | ICD-10-CM | POA: Diagnosis not present

## 2019-07-04 DIAGNOSIS — I10 Essential (primary) hypertension: Secondary | ICD-10-CM | POA: Diagnosis not present

## 2019-07-04 DIAGNOSIS — I2510 Atherosclerotic heart disease of native coronary artery without angina pectoris: Secondary | ICD-10-CM | POA: Diagnosis not present

## 2019-07-12 DIAGNOSIS — M1711 Unilateral primary osteoarthritis, right knee: Secondary | ICD-10-CM | POA: Diagnosis not present

## 2019-07-12 DIAGNOSIS — M25561 Pain in right knee: Secondary | ICD-10-CM | POA: Diagnosis not present

## 2019-07-24 DIAGNOSIS — H0011 Chalazion right upper eyelid: Secondary | ICD-10-CM | POA: Diagnosis not present

## 2019-08-11 DIAGNOSIS — M1711 Unilateral primary osteoarthritis, right knee: Secondary | ICD-10-CM | POA: Diagnosis not present

## 2019-08-15 ENCOUNTER — Telehealth: Payer: Self-pay | Admitting: *Deleted

## 2019-08-15 NOTE — Telephone Encounter (Signed)
Patient called x 2, no answer. Unable to leave voicemail message regarding repeat pneumonia vaccine.

## 2019-10-03 DIAGNOSIS — I25810 Atherosclerosis of coronary artery bypass graft(s) without angina pectoris: Secondary | ICD-10-CM | POA: Diagnosis not present

## 2019-10-03 DIAGNOSIS — I2510 Atherosclerotic heart disease of native coronary artery without angina pectoris: Secondary | ICD-10-CM | POA: Diagnosis not present

## 2019-10-03 DIAGNOSIS — I10 Essential (primary) hypertension: Secondary | ICD-10-CM | POA: Diagnosis not present

## 2019-10-03 DIAGNOSIS — E782 Mixed hyperlipidemia: Secondary | ICD-10-CM | POA: Diagnosis not present

## 2019-10-03 DIAGNOSIS — R0602 Shortness of breath: Secondary | ICD-10-CM | POA: Diagnosis not present

## 2019-10-18 DIAGNOSIS — R0602 Shortness of breath: Secondary | ICD-10-CM | POA: Diagnosis not present

## 2019-11-23 DIAGNOSIS — I10 Essential (primary) hypertension: Secondary | ICD-10-CM | POA: Diagnosis not present

## 2019-11-23 DIAGNOSIS — R7303 Prediabetes: Secondary | ICD-10-CM | POA: Diagnosis not present

## 2019-11-23 DIAGNOSIS — Z136 Encounter for screening for cardiovascular disorders: Secondary | ICD-10-CM | POA: Diagnosis not present

## 2019-11-30 DIAGNOSIS — R7303 Prediabetes: Secondary | ICD-10-CM | POA: Diagnosis not present

## 2019-11-30 DIAGNOSIS — I10 Essential (primary) hypertension: Secondary | ICD-10-CM | POA: Diagnosis not present

## 2019-11-30 DIAGNOSIS — F4321 Adjustment disorder with depressed mood: Secondary | ICD-10-CM | POA: Diagnosis not present

## 2019-11-30 DIAGNOSIS — I700 Atherosclerosis of aorta: Secondary | ICD-10-CM | POA: Diagnosis not present

## 2019-11-30 DIAGNOSIS — Z0000 Encounter for general adult medical examination without abnormal findings: Secondary | ICD-10-CM | POA: Diagnosis not present

## 2019-12-01 DIAGNOSIS — I700 Atherosclerosis of aorta: Secondary | ICD-10-CM | POA: Insufficient documentation

## 2020-01-04 DIAGNOSIS — I10 Essential (primary) hypertension: Secondary | ICD-10-CM | POA: Diagnosis not present

## 2020-01-04 DIAGNOSIS — I340 Nonrheumatic mitral (valve) insufficiency: Secondary | ICD-10-CM | POA: Diagnosis not present

## 2020-01-04 DIAGNOSIS — I2510 Atherosclerotic heart disease of native coronary artery without angina pectoris: Secondary | ICD-10-CM | POA: Diagnosis not present

## 2020-01-04 DIAGNOSIS — E785 Hyperlipidemia, unspecified: Secondary | ICD-10-CM | POA: Diagnosis not present

## 2020-01-16 DIAGNOSIS — F418 Other specified anxiety disorders: Secondary | ICD-10-CM | POA: Diagnosis not present

## 2020-01-16 DIAGNOSIS — I10 Essential (primary) hypertension: Secondary | ICD-10-CM | POA: Diagnosis not present

## 2020-01-16 DIAGNOSIS — R7303 Prediabetes: Secondary | ICD-10-CM | POA: Diagnosis not present

## 2020-01-16 DIAGNOSIS — Z23 Encounter for immunization: Secondary | ICD-10-CM | POA: Diagnosis not present

## 2020-02-08 ENCOUNTER — Ambulatory Visit: Payer: PPO | Admitting: Urology

## 2020-04-05 DIAGNOSIS — R0602 Shortness of breath: Secondary | ICD-10-CM | POA: Diagnosis not present

## 2020-04-05 DIAGNOSIS — I10 Essential (primary) hypertension: Secondary | ICD-10-CM | POA: Diagnosis not present

## 2020-04-05 DIAGNOSIS — I2510 Atherosclerotic heart disease of native coronary artery without angina pectoris: Secondary | ICD-10-CM | POA: Diagnosis not present

## 2020-04-05 DIAGNOSIS — I25810 Atherosclerosis of coronary artery bypass graft(s) without angina pectoris: Secondary | ICD-10-CM | POA: Diagnosis not present

## 2020-04-05 DIAGNOSIS — E782 Mixed hyperlipidemia: Secondary | ICD-10-CM | POA: Diagnosis not present

## 2020-04-29 ENCOUNTER — Other Ambulatory Visit: Payer: Self-pay | Admitting: Family Medicine

## 2020-04-29 DIAGNOSIS — Z1231 Encounter for screening mammogram for malignant neoplasm of breast: Secondary | ICD-10-CM

## 2020-06-11 ENCOUNTER — Ambulatory Visit
Admission: RE | Admit: 2020-06-11 | Discharge: 2020-06-11 | Disposition: A | Discharge status: Home / Self Care | Payer: PPO | Source: Ambulatory Visit | Attending: Family Medicine | Admitting: Family Medicine

## 2020-06-11 ENCOUNTER — Other Ambulatory Visit: Payer: Self-pay

## 2020-06-11 DIAGNOSIS — Z1231 Encounter for screening mammogram for malignant neoplasm of breast: Secondary | ICD-10-CM | POA: Insufficient documentation

## 2020-07-10 DIAGNOSIS — I10 Essential (primary) hypertension: Secondary | ICD-10-CM | POA: Diagnosis not present

## 2020-07-10 DIAGNOSIS — R7303 Prediabetes: Secondary | ICD-10-CM | POA: Diagnosis not present

## 2020-07-11 DIAGNOSIS — I10 Essential (primary) hypertension: Secondary | ICD-10-CM | POA: Diagnosis not present

## 2020-07-11 DIAGNOSIS — I340 Nonrheumatic mitral (valve) insufficiency: Secondary | ICD-10-CM | POA: Diagnosis not present

## 2020-07-11 DIAGNOSIS — I2510 Atherosclerotic heart disease of native coronary artery without angina pectoris: Secondary | ICD-10-CM | POA: Diagnosis not present

## 2020-07-11 DIAGNOSIS — E785 Hyperlipidemia, unspecified: Secondary | ICD-10-CM | POA: Diagnosis not present

## 2020-07-17 DIAGNOSIS — K219 Gastro-esophageal reflux disease without esophagitis: Secondary | ICD-10-CM | POA: Diagnosis not present

## 2020-07-17 DIAGNOSIS — F418 Other specified anxiety disorders: Secondary | ICD-10-CM | POA: Diagnosis not present

## 2020-07-17 DIAGNOSIS — I700 Atherosclerosis of aorta: Secondary | ICD-10-CM | POA: Diagnosis not present

## 2020-07-17 DIAGNOSIS — I10 Essential (primary) hypertension: Secondary | ICD-10-CM | POA: Diagnosis not present

## 2020-07-17 DIAGNOSIS — R7303 Prediabetes: Secondary | ICD-10-CM | POA: Diagnosis not present

## 2020-09-17 DIAGNOSIS — R070 Pain in throat: Secondary | ICD-10-CM | POA: Diagnosis not present

## 2020-09-17 DIAGNOSIS — R490 Dysphonia: Secondary | ICD-10-CM | POA: Diagnosis not present

## 2020-09-19 DIAGNOSIS — M1712 Unilateral primary osteoarthritis, left knee: Secondary | ICD-10-CM | POA: Diagnosis not present

## 2020-11-11 DIAGNOSIS — I10 Essential (primary) hypertension: Secondary | ICD-10-CM | POA: Diagnosis not present

## 2020-11-11 DIAGNOSIS — I351 Nonrheumatic aortic (valve) insufficiency: Secondary | ICD-10-CM | POA: Diagnosis not present

## 2020-11-11 DIAGNOSIS — E782 Mixed hyperlipidemia: Secondary | ICD-10-CM | POA: Diagnosis not present

## 2020-11-11 DIAGNOSIS — I2510 Atherosclerotic heart disease of native coronary artery without angina pectoris: Secondary | ICD-10-CM | POA: Diagnosis not present

## 2020-11-11 DIAGNOSIS — I340 Nonrheumatic mitral (valve) insufficiency: Secondary | ICD-10-CM | POA: Diagnosis not present

## 2021-01-09 DIAGNOSIS — I700 Atherosclerosis of aorta: Secondary | ICD-10-CM | POA: Diagnosis not present

## 2021-01-09 DIAGNOSIS — R7303 Prediabetes: Secondary | ICD-10-CM | POA: Diagnosis not present

## 2021-01-16 DIAGNOSIS — R0602 Shortness of breath: Secondary | ICD-10-CM | POA: Diagnosis not present

## 2021-01-16 DIAGNOSIS — R7303 Prediabetes: Secondary | ICD-10-CM | POA: Diagnosis not present

## 2021-01-16 DIAGNOSIS — E782 Mixed hyperlipidemia: Secondary | ICD-10-CM | POA: Diagnosis not present

## 2021-01-16 DIAGNOSIS — I2510 Atherosclerotic heart disease of native coronary artery without angina pectoris: Secondary | ICD-10-CM | POA: Diagnosis not present

## 2021-01-16 DIAGNOSIS — I351 Nonrheumatic aortic (valve) insufficiency: Secondary | ICD-10-CM | POA: Diagnosis not present

## 2021-01-16 DIAGNOSIS — R079 Chest pain, unspecified: Secondary | ICD-10-CM | POA: Diagnosis not present

## 2021-01-16 DIAGNOSIS — I25810 Atherosclerosis of coronary artery bypass graft(s) without angina pectoris: Secondary | ICD-10-CM | POA: Diagnosis not present

## 2021-01-16 DIAGNOSIS — R55 Syncope and collapse: Secondary | ICD-10-CM | POA: Diagnosis not present

## 2021-01-16 DIAGNOSIS — I340 Nonrheumatic mitral (valve) insufficiency: Secondary | ICD-10-CM | POA: Diagnosis not present

## 2021-01-16 DIAGNOSIS — Z8674 Personal history of sudden cardiac arrest: Secondary | ICD-10-CM | POA: Insufficient documentation

## 2021-01-16 DIAGNOSIS — I10 Essential (primary) hypertension: Secondary | ICD-10-CM | POA: Diagnosis not present

## 2021-01-16 DIAGNOSIS — Z0000 Encounter for general adult medical examination without abnormal findings: Secondary | ICD-10-CM | POA: Diagnosis not present

## 2021-01-20 DIAGNOSIS — I340 Nonrheumatic mitral (valve) insufficiency: Secondary | ICD-10-CM | POA: Diagnosis not present

## 2021-01-20 DIAGNOSIS — I4439 Other atrioventricular block: Secondary | ICD-10-CM | POA: Diagnosis not present

## 2021-01-20 DIAGNOSIS — R55 Syncope and collapse: Secondary | ICD-10-CM | POA: Diagnosis not present

## 2021-01-20 DIAGNOSIS — I10 Essential (primary) hypertension: Secondary | ICD-10-CM | POA: Diagnosis not present

## 2021-01-20 DIAGNOSIS — I2510 Atherosclerotic heart disease of native coronary artery without angina pectoris: Secondary | ICD-10-CM | POA: Diagnosis not present

## 2021-01-20 DIAGNOSIS — I351 Nonrheumatic aortic (valve) insufficiency: Secondary | ICD-10-CM | POA: Diagnosis not present

## 2021-01-20 DIAGNOSIS — I25810 Atherosclerosis of coronary artery bypass graft(s) without angina pectoris: Secondary | ICD-10-CM | POA: Diagnosis not present

## 2021-01-20 DIAGNOSIS — E782 Mixed hyperlipidemia: Secondary | ICD-10-CM | POA: Diagnosis not present

## 2021-01-21 DIAGNOSIS — R079 Chest pain, unspecified: Secondary | ICD-10-CM | POA: Diagnosis not present

## 2021-01-27 DIAGNOSIS — I351 Nonrheumatic aortic (valve) insufficiency: Secondary | ICD-10-CM | POA: Diagnosis not present

## 2021-01-27 DIAGNOSIS — I25810 Atherosclerosis of coronary artery bypass graft(s) without angina pectoris: Secondary | ICD-10-CM | POA: Diagnosis not present

## 2021-01-27 DIAGNOSIS — I340 Nonrheumatic mitral (valve) insufficiency: Secondary | ICD-10-CM | POA: Diagnosis not present

## 2021-01-27 DIAGNOSIS — I2510 Atherosclerotic heart disease of native coronary artery without angina pectoris: Secondary | ICD-10-CM | POA: Diagnosis not present

## 2021-01-27 DIAGNOSIS — I10 Essential (primary) hypertension: Secondary | ICD-10-CM | POA: Diagnosis not present

## 2021-03-14 DIAGNOSIS — I340 Nonrheumatic mitral (valve) insufficiency: Secondary | ICD-10-CM | POA: Diagnosis not present

## 2021-03-14 DIAGNOSIS — R0602 Shortness of breath: Secondary | ICD-10-CM | POA: Diagnosis not present

## 2021-03-14 DIAGNOSIS — I25810 Atherosclerosis of coronary artery bypass graft(s) without angina pectoris: Secondary | ICD-10-CM | POA: Diagnosis not present

## 2021-03-14 DIAGNOSIS — I10 Essential (primary) hypertension: Secondary | ICD-10-CM | POA: Diagnosis not present

## 2021-03-14 DIAGNOSIS — E782 Mixed hyperlipidemia: Secondary | ICD-10-CM | POA: Diagnosis not present

## 2021-03-14 DIAGNOSIS — I2510 Atherosclerotic heart disease of native coronary artery without angina pectoris: Secondary | ICD-10-CM | POA: Diagnosis not present

## 2021-03-14 DIAGNOSIS — I351 Nonrheumatic aortic (valve) insufficiency: Secondary | ICD-10-CM | POA: Diagnosis not present

## 2021-05-01 ENCOUNTER — Other Ambulatory Visit: Payer: Self-pay | Admitting: Family Medicine

## 2021-05-01 DIAGNOSIS — Z1231 Encounter for screening mammogram for malignant neoplasm of breast: Secondary | ICD-10-CM

## 2021-06-07 IMAGING — CT CT ABD-PELV W/ CM
2 of 5 series · 15 of 46 positions shown, 17 images · IV contrast (APPLIED)
Comparison: [DATE] [DATE], [DATE]. [DATE] [DATE], [DATE].

CLINICAL DATA: Acute generalized abdominal pain. Chest pain.

EXAM:
CT ANGIOGRAPHY CHEST
CT ABDOMEN AND PELVIS WITH CONTRAST
TECHNIQUE: Multidetector CT imaging of the chest was performed using the
standard protocol during bolus administration of intravenous
contrast. Multiplanar CT image reconstructions and MIPs were
obtained to evaluate the vascular anatomy. Multidetector CT imaging
of the abdomen and pelvis was performed using the standard protocol
during bolus administration of intravenous contrast.
CONTRAST:  75mL OMNIPAQUE IOHEXOL 350 MG/ML SOLN

[Series 2: axial st · axial · 0.74mm/px · z∈[-744,-324]mm · 12 of 96 slices shown, 14 images]
[im 6/96  soft-tissue]
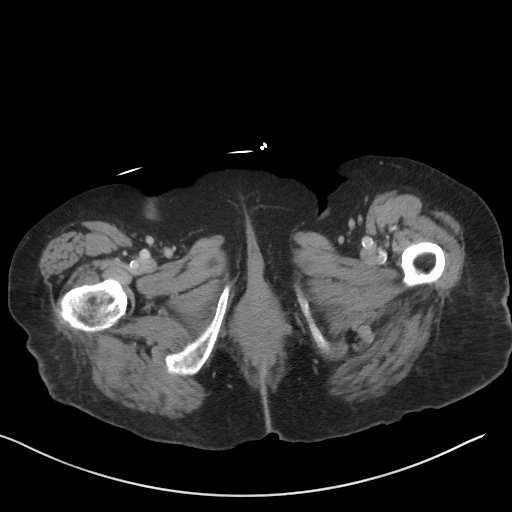
[im 6/96  bone]
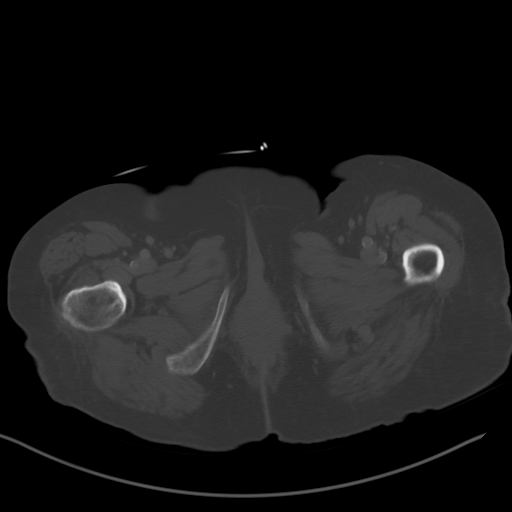
[im 17/96  soft-tissue]
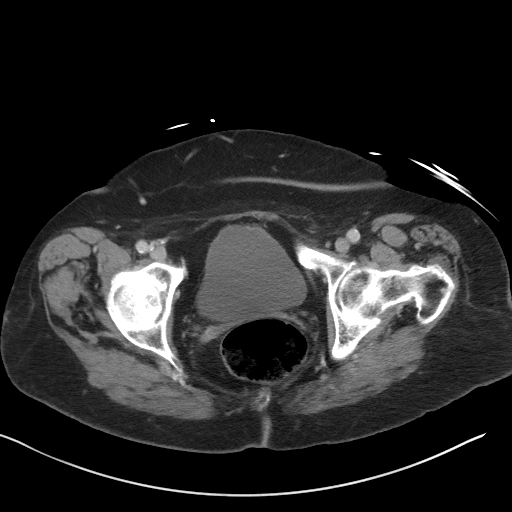
[im 23/96  soft-tissue]
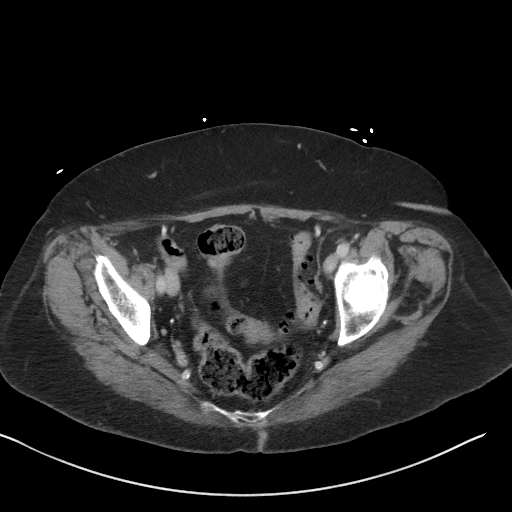
[im 28/96  soft-tissue]
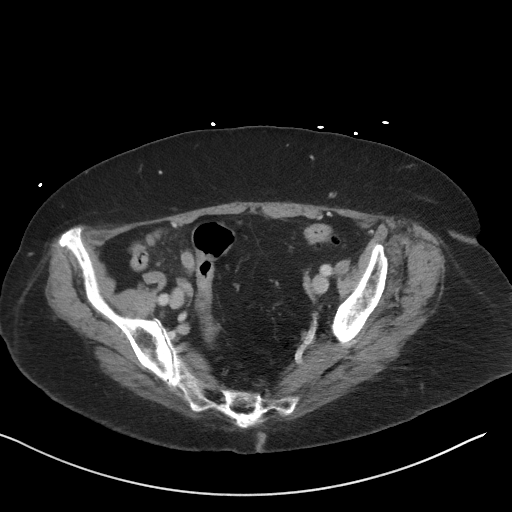
[im 40/96  soft-tissue]
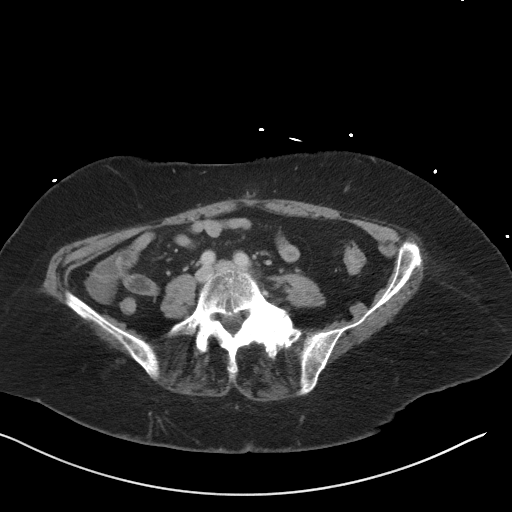
[im 45/96  soft-tissue]
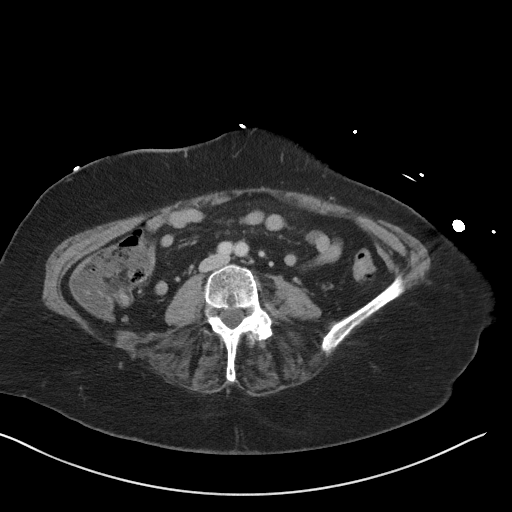
[im 51/96  soft-tissue]
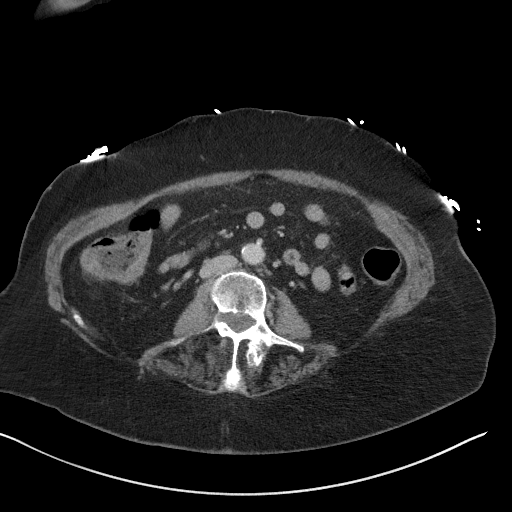
[im 62/96  soft-tissue]
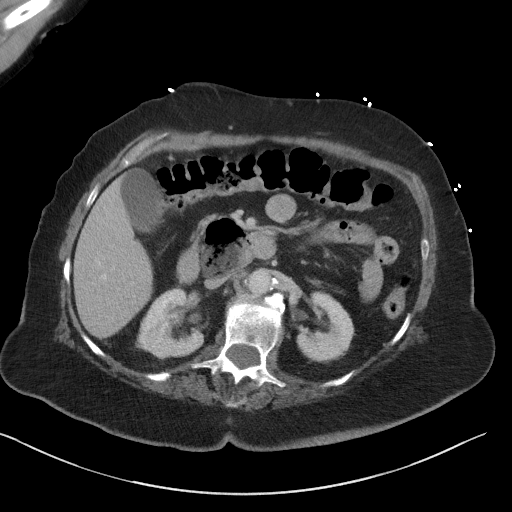
[im 68/96  soft-tissue]
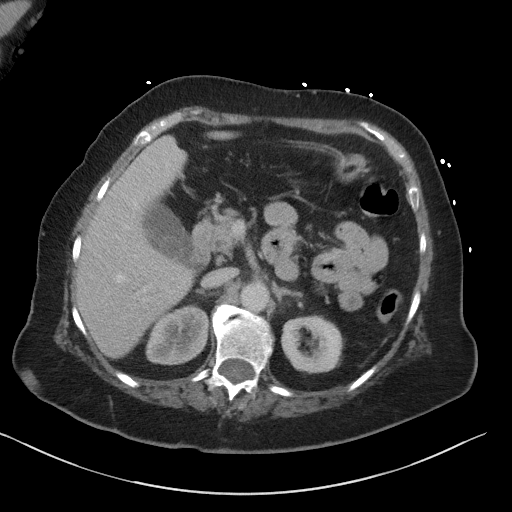
[im 68/96  bone]
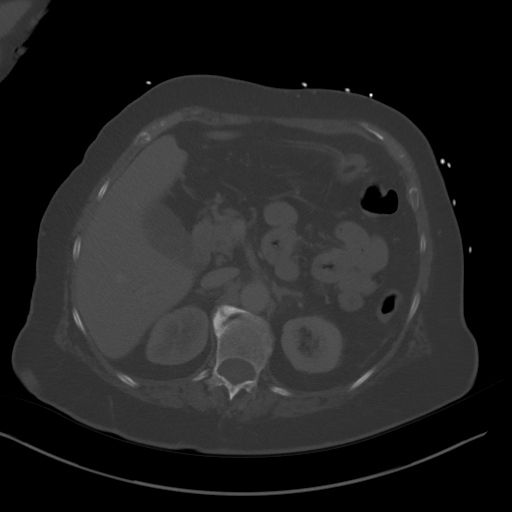
[im 73/96  soft-tissue]
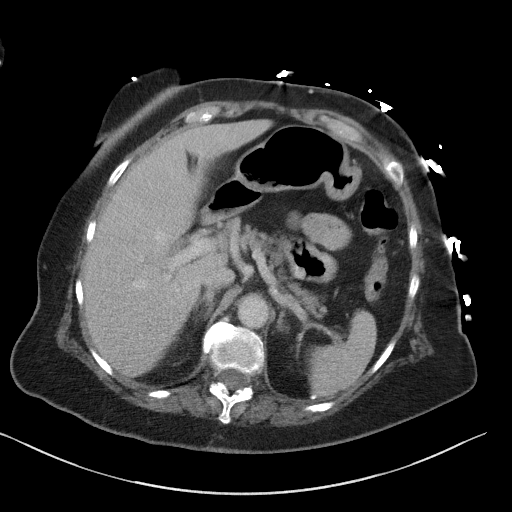
[im 84/96  soft-tissue]
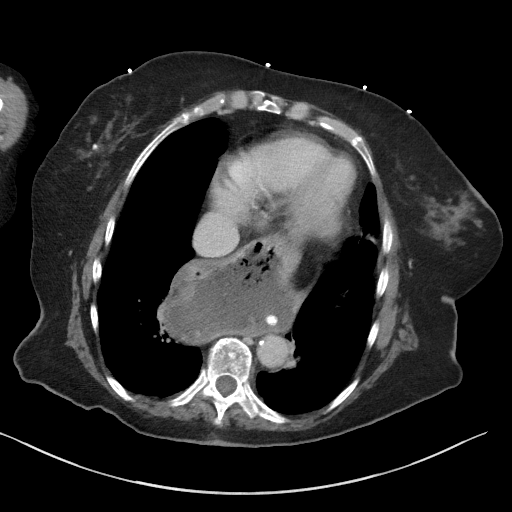
[im 90/96  soft-tissue]
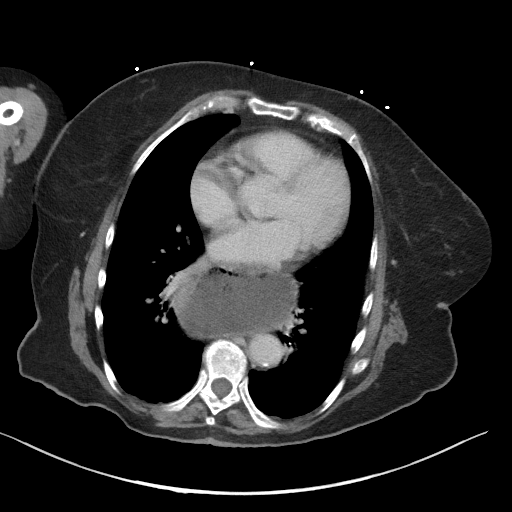

[Series 5: coronal st · coronal · 0.68mm/px · 3 of 88 slices shown]
[im 30/88  soft-tissue]
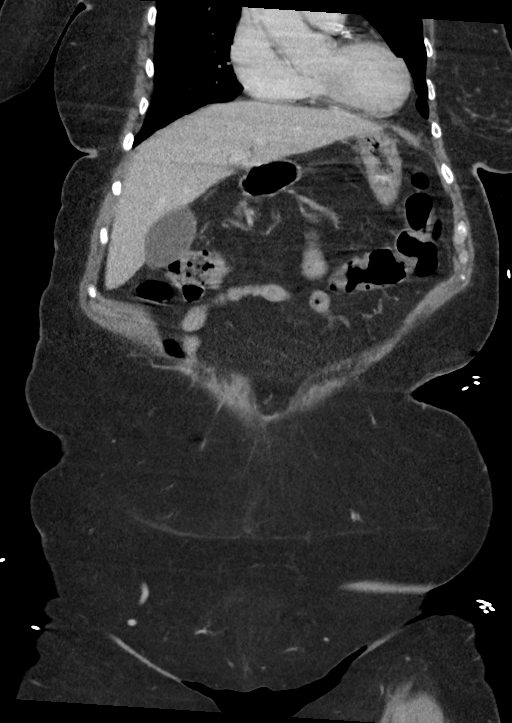
[im 39/88  soft-tissue]
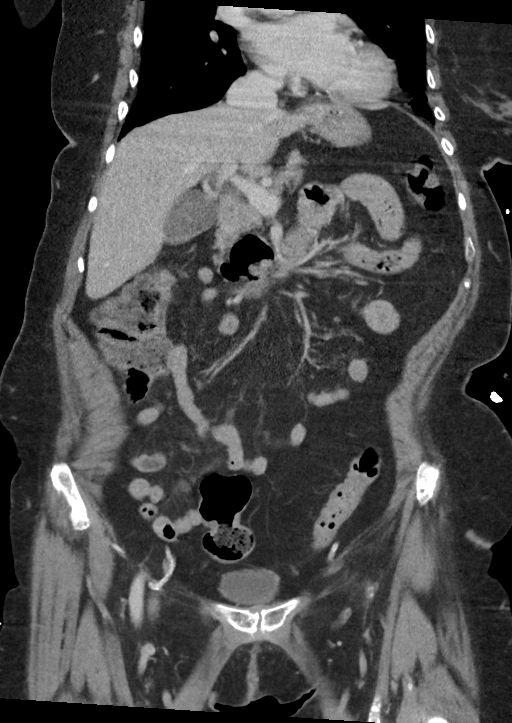
[im 49/88  soft-tissue]
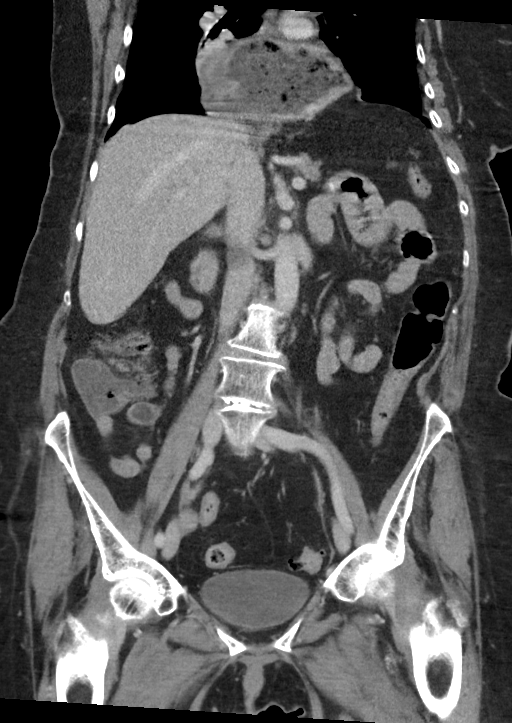

[15 of 46 positions shown; findings below may reference images not displayed]

FINDINGS: CTA CHEST FINDINGS

Cardiovascular: Satisfactory opacification of the pulmonary arteries
to the segmental level. No evidence of pulmonary embolism. Normal
heart size. No pericardial effusion. Atherosclerosis of thoracic
aorta is noted without aneurysm or dissection. Coronary artery
calcifications are noted.

Mediastinum/Nodes: Large sliding-type hiatal hernia is noted.
Thyroid gland is unremarkable. No adenopathy is noted.

Lungs/Pleura: Lungs are clear. No pleural effusion or pneumothorax.

Musculoskeletal: No chest wall abnormality. No acute or significant
osseous findings.

Review of the MIP images confirms the above findings.

CT ABDOMEN and PELVIS FINDINGS

Hepatobiliary: Probable gallstones are noted. No gallbladder wall
thickening or biliary dilatation is noted. The liver is
unremarkable.

Pancreas: Unremarkable. No pancreatic ductal dilatation or
surrounding inflammatory changes.

Spleen: Small splenic cyst is noted. No other abnormality is noted.

Adrenals/Urinary Tract: Adrenal glands appear normal. Left kidney
and ureter are unremarkable. Urinary bladder is unremarkable. 16 mm
calculus is seen in upper pole of right kidney which appears to be
within the infundibulum in resulting in mild upper pole caliectasis.

Stomach/Bowel: Stomach is within normal limits. Appendix appears
normal. No evidence of bowel wall thickening, distention, or
inflammatory changes. Sigmoid diverticulosis is noted without
inflammation.

Vascular/Lymphatic: Aortic atherosclerosis. No enlarged abdominal or
pelvic lymph nodes.

Reproductive: Status post hysterectomy. No adnexal masses.

Other: No abdominal wall hernia or abnormality. No abdominopelvic
ascites.

Musculoskeletal: No acute or significant osseous findings.

Review of the MIP images confirms the above findings.
IMPRESSION: 1. No definite evidence of pulmonary embolus.
2. Coronary artery calcifications are noted suggesting coronary
artery disease.
3. Large sliding-type hiatal hernia is noted.
4. Probable cholelithiasis without inflammation.
5. 16 mm calculus is seen in upper pole of right kidney which
appears to be within the infundibulum in resulting in mild upper
pole caliectasis and obstruction.
6. Sigmoid diverticulosis is noted without inflammation.
7. Aortic atherosclerosis.

Aortic Atherosclerosis (O8IC4-CLE.E).

## 2021-06-08 IMAGING — US US RENAL
1 series · 14 of 25 positions shown · non-contrast
Comparison: None.

CLINICAL DATA: Renal stone

EXAM:
RENAL / URINARY TRACT ULTRASOUND COMPLETE

[Series 1: us renal · 14 of 39 slices shown]
[im 1/39]
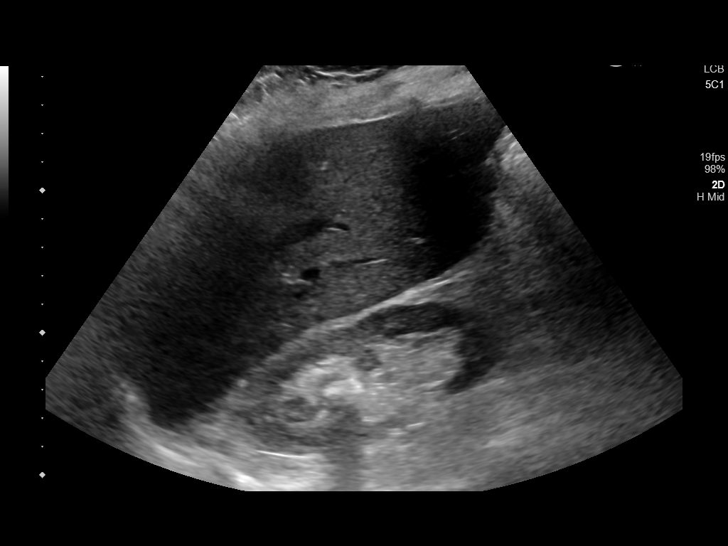
[im 4/39]
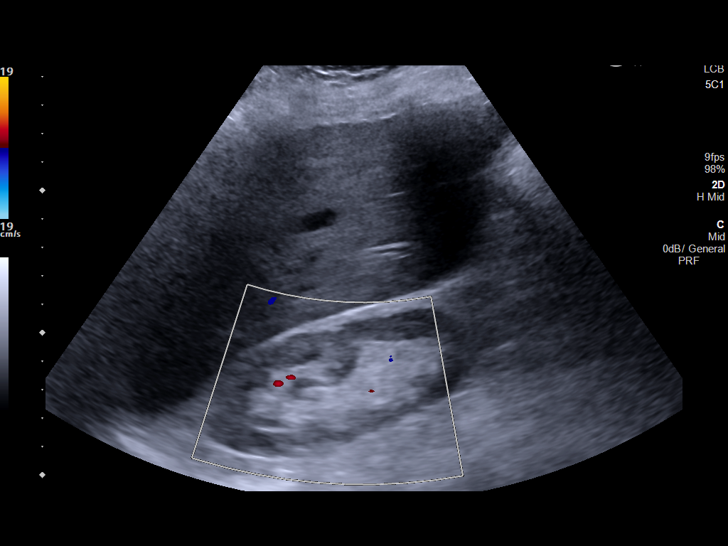
[im 7/39]
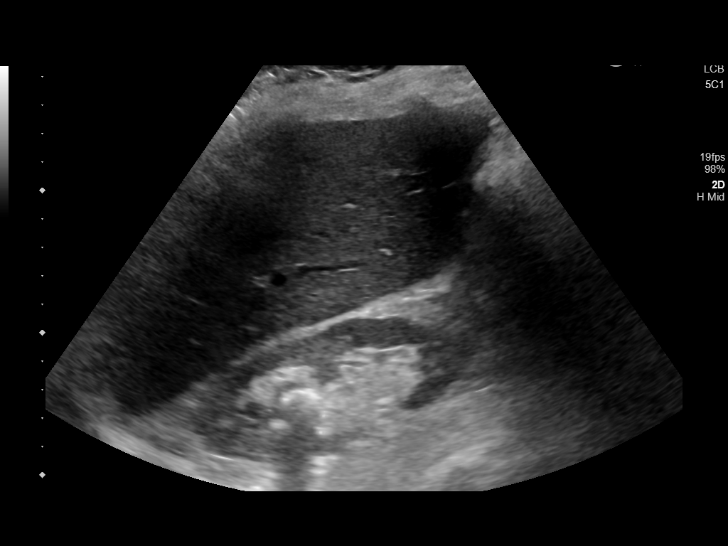
[im 10/39]
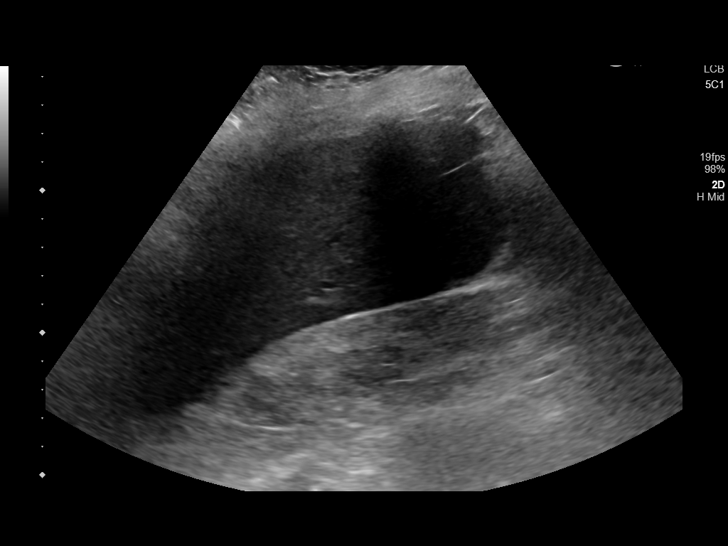
[im 13/39]
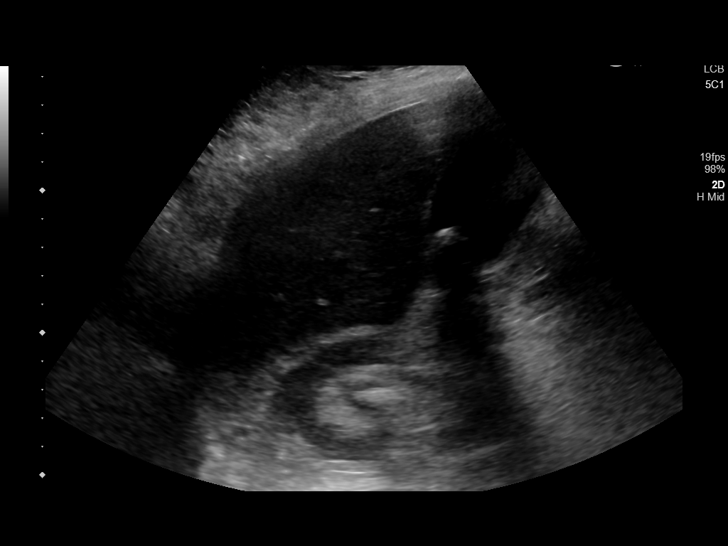
[im 15/39]
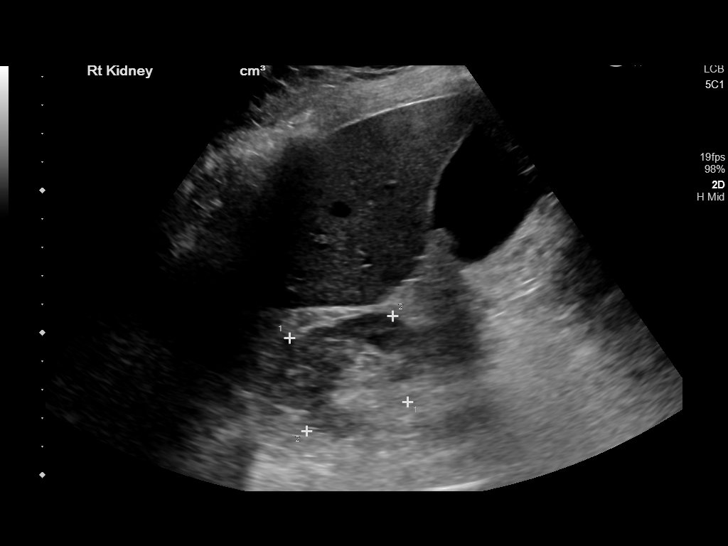
[im 18/39]
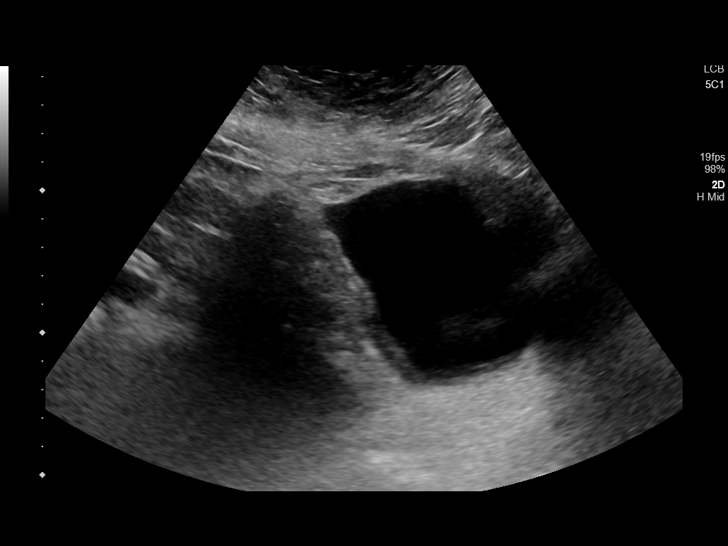
[im 21/39]
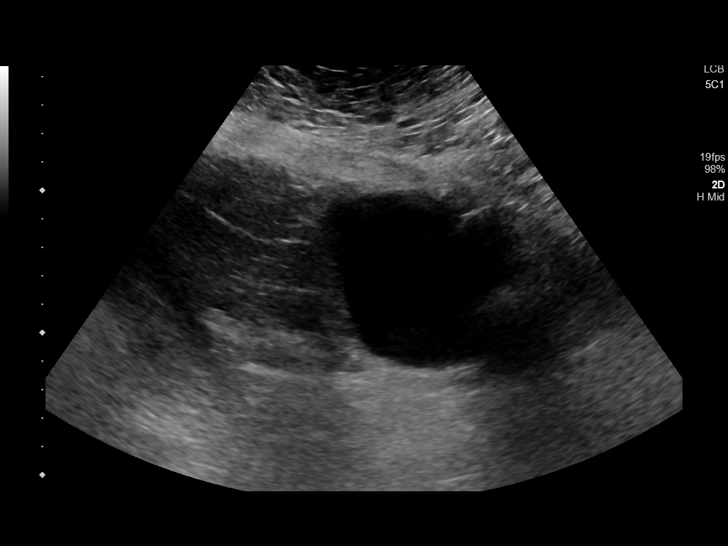
[im 24/39]
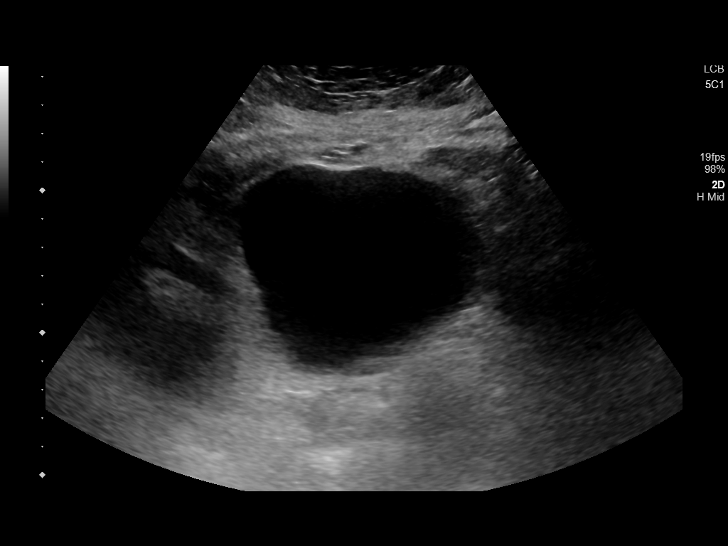
[im 26/39]
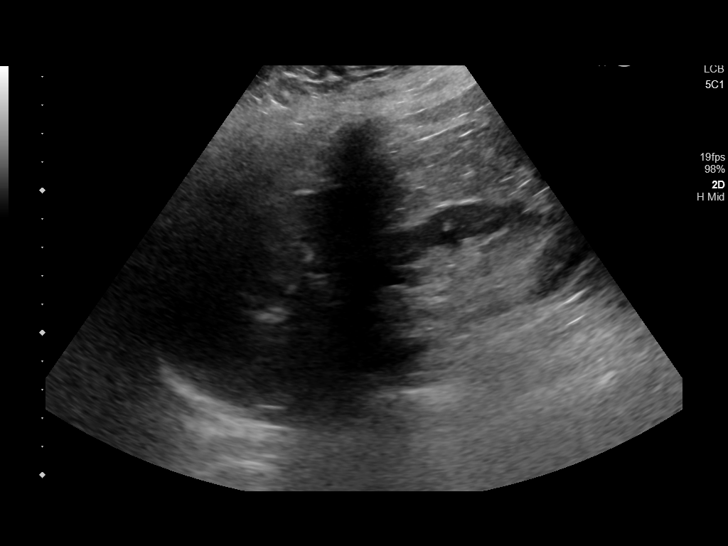
[im 29/39]
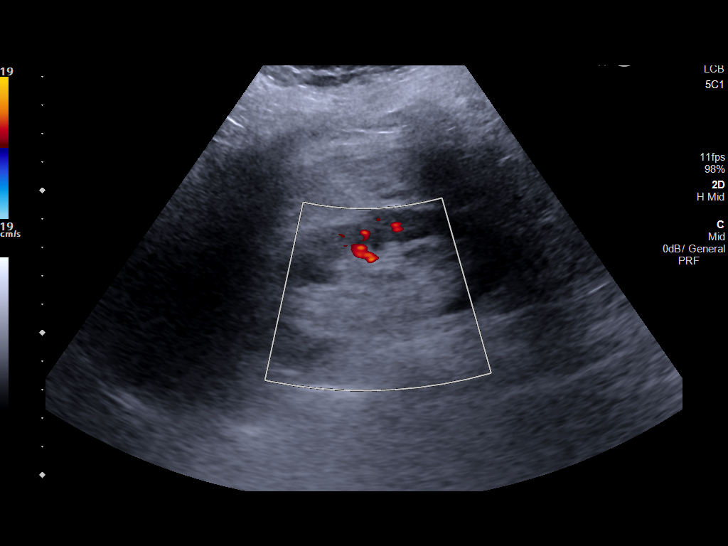
[im 32/39]
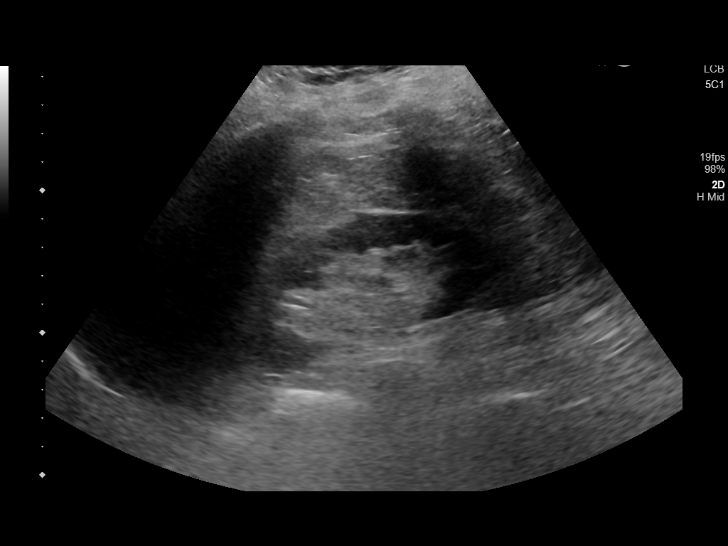
[im 35/39]
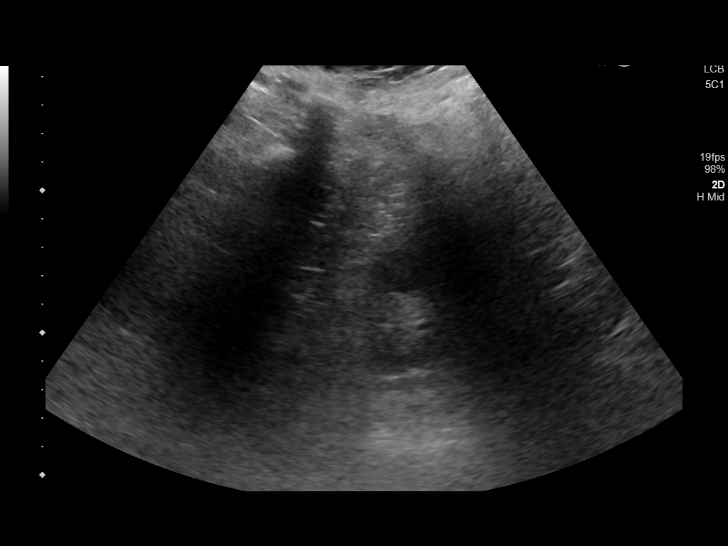
[im 39/39]
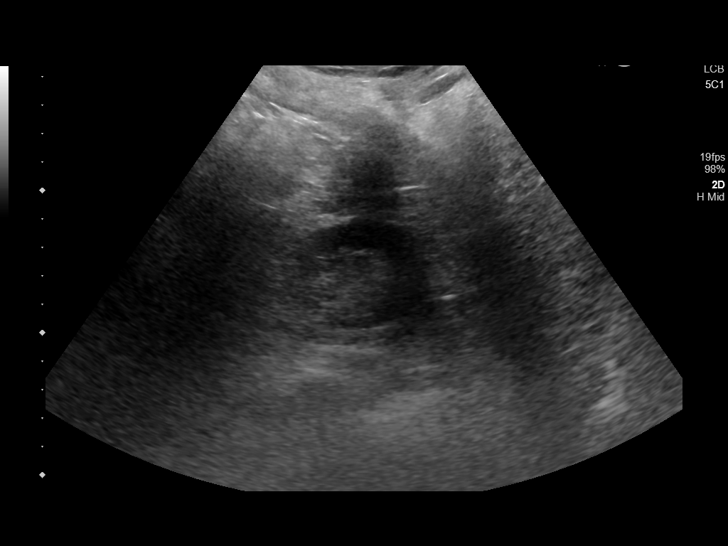

[14 of 25 positions shown; findings below may reference images not displayed]

FINDINGS: Right Kidney:

Renal measurements: 10.5 x 4.7 x 5.0 cm = volume: 131 mL. RIGHT
renal stone measuring 1.7 cm. No hydronephrosis. Cortical
echogenicity is within normal limits.

Left Kidney:

Renal measurements: 10.9 x 4.7 x 4.8 cm = volume: 130 mL.
Echogenicity within normal limits. No mass or hydronephrosis
visualized. No renal stones seen.

Bladder:

Appears normal for degree of bladder distention.

Other:

None.
IMPRESSION: 1. RIGHT renal stone measuring 1.7 cm. No associated hydronephrosis.
2. Normal LEFT kidney.

## 2021-06-10 ENCOUNTER — Other Ambulatory Visit: Payer: Self-pay

## 2021-06-10 ENCOUNTER — Emergency Department
Admission: EM | Admit: 2021-06-10 | Discharge: 2021-06-10 | Disposition: A | Discharge status: Home / Self Care | Payer: PPO | Attending: Emergency Medicine | Admitting: Emergency Medicine

## 2021-06-10 DIAGNOSIS — U071 COVID-19: Secondary | ICD-10-CM | POA: Insufficient documentation

## 2021-06-10 DIAGNOSIS — I10 Essential (primary) hypertension: Secondary | ICD-10-CM | POA: Diagnosis not present

## 2021-06-10 DIAGNOSIS — I2510 Atherosclerotic heart disease of native coronary artery without angina pectoris: Secondary | ICD-10-CM | POA: Diagnosis not present

## 2021-06-10 DIAGNOSIS — R5381 Other malaise: Secondary | ICD-10-CM | POA: Diagnosis not present

## 2021-06-10 DIAGNOSIS — R059 Cough, unspecified: Secondary | ICD-10-CM | POA: Diagnosis present

## 2021-06-10 DIAGNOSIS — I501 Left ventricular failure: Secondary | ICD-10-CM | POA: Diagnosis not present

## 2021-06-10 LAB — BASIC METABOLIC PANEL
Anion gap: 9 (ref 5–15)
BUN: 21 mg/dL (ref 8–23)
CO2: 26 mmol/L (ref 22–32)
Calcium: 8.9 mg/dL (ref 8.9–10.3)
Chloride: 101 mmol/L (ref 98–111)
Creatinine, Ser: 0.84 mg/dL (ref 0.44–1.00)
GFR, Estimated: >60 mL/min (ref >60)
Glucose, Bld: 133 mg/dL — ABNORMAL HIGH (ref 70–99)
Potassium: 3.3 mmol/L — ABNORMAL LOW (ref 3.5–5.1)
Sodium: 136 mmol/L (ref 135–145)

## 2021-06-10 LAB — CBC
HCT: 41.7 % (ref 36.0–46.0)
Hemoglobin: 13.8 g/dL (ref 12.0–15.0)
MCH: 30.5 pg (ref 26.0–34.0)
MCHC: 33.1 g/dL (ref 30.0–36.0)
MCV: 92.3 fL (ref 80.0–100.0)
Platelets: 151 10*3/uL (ref 150–400)
RBC: 4.52 MIL/uL (ref 3.87–5.11)
RDW: 12.7 % (ref 11.5–15.5)
WBC: 3.5 10*3/uL — ABNORMAL LOW (ref 4.0–10.5)
nRBC: 0 % (ref 0.0–0.2)

## 2021-06-10 LAB — RESP PANEL BY RT-PCR (FLU A&B, COVID) ARPGX2
Influenza A by PCR: NEGATIVE
Influenza B by PCR: NEGATIVE
SARS Coronavirus 2 by RT PCR: POSITIVE — AB

## 2021-06-10 MED ORDER — NIRMATRELVIR/RITONAVIR (PAXLOVID)TABLET: 3.0000 | ORAL_TABLET | Freq: Two times a day (BID) | ORAL | 0 refills | Status: DC

## 2021-06-10 MED ORDER — ONDANSETRON HCL 4 MG PO TABS: 4.0000 mg | ORAL_TABLET | Freq: Three times a day (TID) | ORAL | 0 refills | Status: AC | PRN

## 2021-06-10 MED ORDER — SODIUM CHLORIDE 0.9 % IV BOLUS
1000.0000 mL | Freq: Once | INTRAVENOUS | Status: AC
  Administered 2021-06-10: 1000 mL via INTRAVENOUS

## 2021-06-10 NOTE — ED Notes (Signed)
Pt NAD, a/ox4. Pt and family member verbalizes understanding of all DC and f/u instructions. All questions answered. Pt assisted into w/c and wheeled out by family member ? ?

## 2021-06-10 NOTE — ED Provider Notes (Signed)
? ?[facility] ?Provider Note ? ? ? Event Date/Time  ? First MD Initiated Contact with Patient 06/10/21 [phone]   ?  (approximate) ? ? ?History  ? ?Sick ? ? ?HPI ? ?Ashley Tanner is a 81 y.o. female  who, per office note dated 03/14/2021 has history of HTN, CAD, HLD, who presents to the emergency department today because of concern for feeling sick. She states it started suddenly 3 days ago. Feels very weak. The patient states that she has had some nausea. Has had decreased oral intake. When symptoms started did have some cough. Denies any shortness of breath. Weakness is present when she tries to ambulate. Denies any known sick contacts although goes to church.  ? ?Physical Exam  ? ?Triage Vital Signs: ?ED Triage Vitals  ?Enc Vitals Group  ?   BP --   ?   Pulse Rate 06/10/21 0923 68  ?   Resp 06/10/21 0923 14  ?   Temp 06/10/21 0923 98.7 ?F (37.1 ?C)  ?   Temp Source 06/10/21 0923 Oral  ?   SpO2 06/10/21 0923 100 %  ?   Weight 06/10/21 0915 153 lb 3.5 oz (69.5 kg)  ?   Height 06/10/21 0915 _0  (1.6 m)  ?   Head Circumference --   ?   Peak Flow --   ?   Pain Score 06/10/21 0915 0  ?   Pain Loc --   ?   Pain Edu? --   ?   Excl. in [place]? --   ? ? ?Most recent vital signs: ?Vitals:  ? 06/10/21 0923  ?Pulse: 68  ?Resp: 14  ?Temp: 98.7 ?F (37.1 ?C)  ?SpO2: 100%  ? ? ?General: Awake, no distress.  ?CV:  Good peripheral perfusion. Regular rate and rhythm. ?Resp:  Normal effort. Lungs clear to auscultation ?Abd:  No distention. Non tender. ? ? ?ED Results / Procedures / Treatments  ? ?Labs ?(all labs ordered are listed, but only abnormal results are displayed) ?Labs Reviewed  ?RESP PANEL BY RT-PCR (FLU A&B, COVID) ARPGX2 - Abnormal; Notable for the following components:  ?    Result Value  ? SARS Coronavirus 2 by RT PCR POSITIVE (*)   ? All other components within normal limits  ?BASIC METABOLIC PANEL - Abnormal; Notable for the following components:  ? Potassium 3.3 (*)   ? Glucose, Bld 133 (*)   ?  All other components within normal limits  ?CBC - Abnormal; Notable for the following components:  ? WBC 3.5 (*)   ? All other components within normal limits  ?URINALYSIS, ROUTINE W REFLEX MICROSCOPIC  ?CBG MONITORING, ED  ? ? ? ?EKG ? ?INance Pear, attending physician, personally viewed and interpreted this EKG ? ?EKG Time: 0920 ?Rate: 68 ?Rhythm: sinus rhythm ?Axis: normal ?Intervals: qtc 463 ?QRS: RBBB ?ST changes: no st elevation ?Impression: abnormal ekg ? ?RADIOLOGY ?None ? ?PROCEDURES: ? ?Critical Care performed: No ? ?Procedures ? ? ?MEDICATIONS ORDERED IN ED: ?Medications - No data to display ? ? ?IMPRESSION / MDM / ASSESSMENT AND PLAN / ED COURSE  ?I reviewed the triage vital signs and the nursing notes. ?             ?               ? ?Differential diagnosis includes, but is not limited to, covid, influenza, anemia, electrolyte abnormality.  ? ?Patient presented to the emergency department today because of concerns  for feeling sick.  Patient did test positive for COVID.  I do think this could explain the patient's myriad symptoms.  I considered UTI patient denied any dysuria or change in urinary habits.  I think it is less likely urinary tract infection and again COVID would explain her symptoms.  We will plan on starting patient on Paxlovid as well as antiemetics. Given that patient is not hypoxic and not having any increased respiratory effort do not feel patient requires inpatient admission at this time. Discussed findings and plan with patient.  ? ? ?FINAL CLINICAL IMPRESSION(S) / ED DIAGNOSES  ? ?Final diagnoses:  ?COVID-19  ? ? ? ? ? ? ?Note:  This document was prepared using Dragon voice recognition software and may include unintentional dictation errors. ? ?  ?Nance Pear, MD ?06/10/21 1213 ? ?

## 2021-06-10 NOTE — ED Triage Notes (Signed)
Patient to ER via [facility] from home with complaints of nausea/ weakness and generally feeling unwell since Saturday. Reports poor oral intake, and has been unable to take prescribed medications since Sunday. Reports x2 episodes of emesis.  ? ?VSS- bp 138/60, 98.6 temp, cbg 129 ?

## 2021-06-10 NOTE — ED Notes (Signed)
ED Provider at bedside. 

## 2021-06-10 NOTE — Discharge Instructions (Signed)
Please seek medical attention for any high fevers, chest pain, shortness of breath, change in behavior, persistent vomiting, bloody stool or any other new or concerning symptoms.  

## 2021-06-13 ENCOUNTER — Emergency Department: Payer: PPO

## 2021-06-13 ENCOUNTER — Inpatient Hospital Stay
Admission: EM | Admit: 2021-06-13 | Discharge: 2021-06-15 | DRG: 179 | Disposition: A | Discharge status: Home Health | Payer: PPO | Attending: Internal Medicine | Admitting: Internal Medicine

## 2021-06-13 ENCOUNTER — Other Ambulatory Visit: Payer: Self-pay

## 2021-06-13 DIAGNOSIS — K529 Noninfective gastroenteritis and colitis, unspecified: Secondary | ICD-10-CM

## 2021-06-13 DIAGNOSIS — R0689 Other abnormalities of breathing: Secondary | ICD-10-CM | POA: Diagnosis not present

## 2021-06-13 DIAGNOSIS — K449 Diaphragmatic hernia without obstruction or gangrene: Secondary | ICD-10-CM | POA: Diagnosis not present

## 2021-06-13 DIAGNOSIS — E876 Hypokalemia: Secondary | ICD-10-CM | POA: Diagnosis present

## 2021-06-13 DIAGNOSIS — R638 Other symptoms and signs concerning food and fluid intake: Secondary | ICD-10-CM

## 2021-06-13 DIAGNOSIS — Z86010 Personal history of colonic polyps: Secondary | ICD-10-CM

## 2021-06-13 DIAGNOSIS — Z7982 Long term (current) use of aspirin: Secondary | ICD-10-CM | POA: Diagnosis not present

## 2021-06-13 DIAGNOSIS — G629 Polyneuropathy, unspecified: Secondary | ICD-10-CM | POA: Diagnosis present

## 2021-06-13 DIAGNOSIS — U071 COVID-19: Principal | ICD-10-CM

## 2021-06-13 DIAGNOSIS — K219 Gastro-esophageal reflux disease without esophagitis: Secondary | ICD-10-CM | POA: Diagnosis not present

## 2021-06-13 DIAGNOSIS — Z90710 Acquired absence of both cervix and uterus: Secondary | ICD-10-CM

## 2021-06-13 DIAGNOSIS — E538 Deficiency of other specified B group vitamins: Secondary | ICD-10-CM | POA: Diagnosis not present

## 2021-06-13 DIAGNOSIS — I2510 Atherosclerotic heart disease of native coronary artery without angina pectoris: Secondary | ICD-10-CM | POA: Diagnosis present

## 2021-06-13 DIAGNOSIS — Z79899 Other long term (current) drug therapy: Secondary | ICD-10-CM

## 2021-06-13 DIAGNOSIS — E119 Type 2 diabetes mellitus without complications: Secondary | ICD-10-CM

## 2021-06-13 DIAGNOSIS — E785 Hyperlipidemia, unspecified: Secondary | ICD-10-CM | POA: Diagnosis present

## 2021-06-13 DIAGNOSIS — R5381 Other malaise: Secondary | ICD-10-CM | POA: Diagnosis not present

## 2021-06-13 DIAGNOSIS — R110 Nausea: Secondary | ICD-10-CM

## 2021-06-13 DIAGNOSIS — R059 Cough, unspecified: Secondary | ICD-10-CM | POA: Diagnosis not present

## 2021-06-13 DIAGNOSIS — Z87442 Personal history of urinary calculi: Secondary | ICD-10-CM | POA: Diagnosis not present

## 2021-06-13 DIAGNOSIS — I10 Essential (primary) hypertension: Secondary | ICD-10-CM | POA: Diagnosis present

## 2021-06-13 DIAGNOSIS — R112 Nausea with vomiting, unspecified: Secondary | ICD-10-CM | POA: Diagnosis not present

## 2021-06-13 LAB — CBC WITH DIFFERENTIAL/PLATELET
Abs Immature Granulocytes: 0.01 10*3/uL (ref 0.00–0.07)
Basophils Absolute: 0 10*3/uL (ref 0.0–0.1)
Basophils Relative: 0 %
Eosinophils Absolute: 0 10*3/uL (ref 0.0–0.5)
Eosinophils Relative: 1 %
HCT: 39.2 % (ref 36.0–46.0)
Hemoglobin: 13.5 g/dL (ref 12.0–15.0)
Immature Granulocytes: 0 %
Lymphocytes Relative: 35 %
Lymphs Abs: 1.3 10*3/uL (ref 0.7–4.0)
MCH: 30.9 pg (ref 26.0–34.0)
MCHC: 34.4 g/dL (ref 30.0–36.0)
MCV: 89.7 fL (ref 80.0–100.0)
Monocytes Absolute: 0.4 10*3/uL (ref 0.1–1.0)
Monocytes Relative: 10 %
Neutro Abs: 2 10*3/uL (ref 1.7–7.7)
Neutrophils Relative %: 54 %
Platelets: 141 10*3/uL — ABNORMAL LOW (ref 150–400)
RBC: 4.37 MIL/uL (ref 3.87–5.11)
RDW: 12.6 % (ref 11.5–15.5)
WBC: 3.7 10*3/uL — ABNORMAL LOW (ref 4.0–10.5)
nRBC: 0 % (ref 0.0–0.2)

## 2021-06-13 LAB — COMPREHENSIVE METABOLIC PANEL
ALT: 14 U/L (ref 0–44)
AST: 21 U/L (ref 15–41)
Albumin: 3.7 g/dL (ref 3.5–5.0)
Alkaline Phosphatase: 71 U/L (ref 38–126)
Anion gap: 12 (ref 5–15)
BUN: 10 mg/dL (ref 8–23)
CO2: 25 mmol/L (ref 22–32)
Calcium: 8.6 mg/dL — ABNORMAL LOW (ref 8.9–10.3)
Chloride: 99 mmol/L (ref 98–111)
Creatinine, Ser: 0.77 mg/dL (ref 0.44–1.00)
GFR, Estimated: >60 mL/min (ref >60)
Glucose, Bld: 158 mg/dL — ABNORMAL HIGH (ref 70–99)
Potassium: 2.9 mmol/L — ABNORMAL LOW (ref 3.5–5.1)
Sodium: 136 mmol/L (ref 135–145)
Total Bilirubin: 1.4 mg/dL — ABNORMAL HIGH (ref 0.3–1.2)
Total Protein: 6.8 g/dL (ref 6.5–8.1)

## 2021-06-13 LAB — TROPONIN I (HIGH SENSITIVITY): Troponin I (High Sensitivity): 11 ng/L (ref <18)

## 2021-06-13 LAB — LIPASE, BLOOD: Lipase: 24 U/L (ref 11–51)

## 2021-06-13 MED ORDER — POTASSIUM CHLORIDE 10 MEQ/100ML IV SOLN
10.0000 meq | INTRAVENOUS | Status: AC
  Administered 2021-06-14 (×3): 10 meq via INTRAVENOUS
  Filled 2021-06-13 (×3): qty 100

## 2021-06-13 MED ORDER — ONDANSETRON HCL 4 MG/2ML IJ SOLN
4.0000 mg | INTRAMUSCULAR | Status: AC
Start: 2021-06-13 — End: 2021-06-13
  Administered 2021-06-13: 4 mg via INTRAVENOUS
  Filled 2021-06-13: qty 2

## 2021-06-13 MED ORDER — SODIUM CHLORIDE 0.9 % IV BOLUS
1000.0000 mL | Freq: Once | INTRAVENOUS | Status: AC
  Administered 2021-06-14: 1000 mL via INTRAVENOUS

## 2021-06-13 NOTE — ED Provider Notes (Signed)
? ?[facility] ?Provider Note ? ? ? Event Date/Time  ? First MD Initiated Contact with Patient 06/13/21 2316   ?  (approximate) ? ? ?History  ? ?Nausea (Covid + Tuesday) ? ? ?HPI ? ?PATCHES MCDONNELL is a 81 y.o. female from prior visit from December 16 at the office has a history of hypertension hyperlipidemia coronary artery disease. ? ?About 1 week ago began feeling fatigued with nausea and decreased oral intake. ?  ?She was diagnosed with COVID-19 on March 14 ? ?Physical Exam  ? ?Triage Vital Signs: ?ED Triage Vitals  ?Enc Vitals Group  ?   BP 06/13/21 2204 (!) 156/86  ?   Pulse Rate 06/13/21 2204 61  ?   Resp 06/13/21 2204 20  ?   Temp 06/13/21 2204 97.7 ?F (36.5 ?C)  ?   Temp Source 06/13/21 2204 Oral  ?   SpO2 06/13/21 2204 96 %  ?   Weight --   ?   Height --   ?   Head Circumference --   ?   Peak Flow --   ?   Pain Score 06/13/21 2205 0  ?   Pain Loc --   ?   Pain Edu? --   ?   Excl. in [place]? --   ? ? ?Most recent vital signs: ?Vitals:  ? 06/13/21 2300 06/14/21 0000  ?BP: (!) 147/77 (!) 157/81  ?Pulse: 66 72  ?Resp: 16 20  ?Temp:    ?SpO2: 96% 97%  ? ? ? ?General: Awake, no distress.  There is fatigue.  Occasional dry heaving reports nausea is unremitting ?CV:  Good peripheral perfusion.  Normal heart tones. ?Resp:  Normal effort.  Clear lungs.  Normal work of breathing.  Normal oxygen saturation ?Abd:  No distention.  Abdomen soft nontender nondistended through all quadrants, reports a nauseated feeling now. ?Other:  Skin warm well perfused ? ? ?ED Results / Procedures / Treatments  ? ?Labs ?(all labs ordered are listed, but only abnormal results are displayed) ?Labs Reviewed  ?COMPREHENSIVE METABOLIC PANEL - Abnormal; Notable for the following components:  ?    Result Value  ? Potassium 2.9 (*)   ? Glucose, Bld 158 (*)   ? Calcium 8.6 (*)   ? Total Bilirubin 1.4 (*)   ? All other components within normal limits  ?CBC WITH DIFFERENTIAL/PLATELET - Abnormal; Notable for the following  components:  ? WBC 3.7 (*)   ? Platelets 141 (*)   ? All other components within normal limits  ?LIPASE, BLOOD  ?MAGNESIUM  ?URINALYSIS, ROUTINE W REFLEX MICROSCOPIC  ?TROPONIN I (HIGH SENSITIVITY)  ?TROPONIN I (HIGH SENSITIVITY)  ? ? ? ?EKG ? ?Personally reviewed and interpreted by me at 2212 ?Heart rate 65 ?QRS 140 ?QTc 500 ?Normal sinus rhythm with first-degree AV block, right bundle branch block.  Notable T wave inversions in lead III and aVF possibly significant for ischemic or electrolyte abnormality, also seen present in somewhat similar formation in V4.  However in comparison to previous EKG from March 14 of this year, EKG changes are not new and appear to be longstanding ? ? ?RADIOLOGY ? ?Personally viewed the patient's three-view imaging acute abdomen.  Notable for a large hiatal hernia, but on review from previous imaging it appears this is nonacute.  No obstructive pattern.  No acute pulmonary process denoted by me on my interpretation ? ? ?DG Abdomen Acute W/Chest ? ?Result Date: 06/14/2021 ?CLINICAL DATA:  Cough, nausea/vomiting EXAM: DG  ABDOMEN ACUTE WITH 1 VIEW CHEST COMPARISON:  CT chest abdomen pelvis dated 02/24/2019 FINDINGS: Lungs are clear.  No pleural effusion or pneumothorax. The heart is normal in size. Large hiatal hernia. Nonobstructive bowel gas pattern. No evidence of free air under the diaphragm on the upright view. Degenerative changes of the visualized thoracolumbar spine. IMPRESSION: Negative abdominal radiographs.  No acute cardiopulmonary disease. Electronically Signed   By: Julian Hy M.D.   On: 06/14/2021 00:43   ? ? ? ?PROCEDURES: ? ?Critical Care performed: No ? ?Procedures ? ? ?MEDICATIONS ORDERED IN ED: ?Medications  ?potassium chloride 10 mEq in 100 mL IVPB (10 mEq Intravenous New Bag/Given 06/14/21 0109)  ?sodium chloride 0.9 % bolus 1,000 mL (1,000 mLs Intravenous New Bag/Given 06/14/21 0001)  ?ondansetron [facility]) injection 4 mg (4 mg Intravenous Given 06/13/21 2358)   ?ondansetron [facility]) injection 4 mg (4 mg Intravenous Given 06/14/21 0042)  ? ? ? ?IMPRESSION / MDM / ASSESSMENT AND PLAN / ED COURSE  ?I reviewed the triage vital signs and the nursing notes. ?             ?               ? ?Differential diagnosis includes, but is not limited to, COVID-19, gastroenteritis, colitis, obstruction, urinary tract infection, other etiologies such as acute metabolic or toxic abnormalities etc. ? ?The patient is on the cardiac monitor to evaluate for evidence of arrhythmia and/or significant heart rate changes. ? ?Admission discussed with Dr. Damita Dunnings who accepts the patient after consultation with her, patient has increased risk of morbidity mortality associated with COVID-19 and apparent intractable nausea and vomiting. ? ? ?  ? ? ?FINAL CLINICAL IMPRESSION(S) / ED DIAGNOSES  ? ?Final diagnoses:  ?COVID-19  ?Gastroenteritis  ? ? ? ?Rx / DC Orders  ? ?ED Discharge Orders   ? ? None  ? ?  ? ? ? ?Note:  This document was prepared using Dragon voice recognition software and may include unintentional dictation errors. ?  Delman Kitten, MD ?06/14/21 [phone] ? ?

## 2021-06-13 NOTE — ED Triage Notes (Signed)
Arrived via [facility] for nausea/vomiting and increased weakness. Covid + Tuesday but states is feeling worse ?

## 2021-06-14 ENCOUNTER — Encounter: Payer: Self-pay | Admitting: Internal Medicine

## 2021-06-14 ENCOUNTER — Emergency Department: Payer: PPO

## 2021-06-14 DIAGNOSIS — E876 Hypokalemia: Secondary | ICD-10-CM | POA: Diagnosis present

## 2021-06-14 DIAGNOSIS — Z90710 Acquired absence of both cervix and uterus: Secondary | ICD-10-CM | POA: Diagnosis not present

## 2021-06-14 DIAGNOSIS — I10 Essential (primary) hypertension: Secondary | ICD-10-CM | POA: Diagnosis present

## 2021-06-14 DIAGNOSIS — R110 Nausea: Secondary | ICD-10-CM | POA: Diagnosis not present

## 2021-06-14 DIAGNOSIS — Z79899 Other long term (current) drug therapy: Secondary | ICD-10-CM | POA: Diagnosis not present

## 2021-06-14 DIAGNOSIS — E785 Hyperlipidemia, unspecified: Secondary | ICD-10-CM | POA: Diagnosis present

## 2021-06-14 DIAGNOSIS — U071 COVID-19: Secondary | ICD-10-CM

## 2021-06-14 DIAGNOSIS — I2510 Atherosclerotic heart disease of native coronary artery without angina pectoris: Secondary | ICD-10-CM | POA: Diagnosis present

## 2021-06-14 DIAGNOSIS — K219 Gastro-esophageal reflux disease without esophagitis: Secondary | ICD-10-CM | POA: Diagnosis present

## 2021-06-14 DIAGNOSIS — R638 Other symptoms and signs concerning food and fluid intake: Secondary | ICD-10-CM | POA: Diagnosis not present

## 2021-06-14 DIAGNOSIS — Z86010 Personal history of colonic polyps: Secondary | ICD-10-CM | POA: Diagnosis not present

## 2021-06-14 DIAGNOSIS — Z87442 Personal history of urinary calculi: Secondary | ICD-10-CM | POA: Diagnosis not present

## 2021-06-14 DIAGNOSIS — K449 Diaphragmatic hernia without obstruction or gangrene: Secondary | ICD-10-CM | POA: Diagnosis present

## 2021-06-14 DIAGNOSIS — K529 Noninfective gastroenteritis and colitis, unspecified: Secondary | ICD-10-CM | POA: Diagnosis present

## 2021-06-14 DIAGNOSIS — G629 Polyneuropathy, unspecified: Secondary | ICD-10-CM | POA: Diagnosis present

## 2021-06-14 DIAGNOSIS — Z7982 Long term (current) use of aspirin: Secondary | ICD-10-CM | POA: Diagnosis not present

## 2021-06-14 DIAGNOSIS — E538 Deficiency of other specified B group vitamins: Secondary | ICD-10-CM | POA: Diagnosis present

## 2021-06-14 DIAGNOSIS — E119 Type 2 diabetes mellitus without complications: Secondary | ICD-10-CM | POA: Diagnosis present

## 2021-06-14 LAB — GLUCOSE, CAPILLARY
Glucose-Capillary: 106 mg/dL — ABNORMAL HIGH (ref 70–99)
Glucose-Capillary: 109 mg/dL — ABNORMAL HIGH (ref 70–99)
Glucose-Capillary: 119 mg/dL — ABNORMAL HIGH (ref 70–99)
Glucose-Capillary: 124 mg/dL — ABNORMAL HIGH (ref 70–99)
Glucose-Capillary: 138 mg/dL — ABNORMAL HIGH (ref 70–99)
Glucose-Capillary: 84 mg/dL (ref 70–99)

## 2021-06-14 LAB — TROPONIN I (HIGH SENSITIVITY): Troponin I (High Sensitivity): 19 ng/L — ABNORMAL HIGH (ref <18)

## 2021-06-14 LAB — POTASSIUM: Potassium: 3.6 mmol/L (ref 3.5–5.1)

## 2021-06-14 LAB — MAGNESIUM: Magnesium: 1.7 mg/dL (ref 1.7–2.4)

## 2021-06-14 MED ORDER — INSULIN ASPART 100 UNIT/ML IJ SOLN
0.0000 [IU] | INTRAMUSCULAR | Status: DC
  Administered 2021-06-14 – 2021-06-15 (×3): 2 [IU] via SUBCUTANEOUS
  Filled 2021-06-14 (×2): qty 1

## 2021-06-14 MED ORDER — ONDANSETRON HCL 4 MG/2ML IJ SOLN
4.0000 mg | Freq: Four times a day (QID) | INTRAMUSCULAR | Status: DC | PRN
Start: 2021-06-14 — End: 2021-06-15

## 2021-06-14 MED ORDER — ACETAMINOPHEN 325 MG PO TABS: 650.0000 mg | ORAL_TABLET | Freq: Four times a day (QID) | ORAL | Status: DC | PRN

## 2021-06-14 MED ORDER — ONDANSETRON HCL 4 MG PO TABS: 4.0000 mg | ORAL_TABLET | Freq: Four times a day (QID) | ORAL | Status: DC | PRN

## 2021-06-14 MED ORDER — HYDRALAZINE HCL 20 MG/ML IJ SOLN: 5.0000 mg | Freq: Four times a day (QID) | INTRAMUSCULAR | Status: DC | PRN

## 2021-06-14 MED ORDER — PROCHLORPERAZINE EDISYLATE 10 MG/2ML IJ SOLN
10.0000 mg | INTRAMUSCULAR | Status: AC | PRN
  Administered 2021-06-14: 10 mg via INTRAVENOUS
  Filled 2021-06-14: qty 2

## 2021-06-14 MED ORDER — LACTATED RINGERS IV SOLN: INTRAVENOUS | Status: DC

## 2021-06-14 MED ORDER — ENOXAPARIN SODIUM 40 MG/0.4ML IJ SOSY
40.0000 mg | PREFILLED_SYRINGE | INTRAMUSCULAR | Status: DC
  Administered 2021-06-14 – 2021-06-15 (×2): 40 mg via SUBCUTANEOUS
  Filled 2021-06-14 (×2): qty 0.4

## 2021-06-14 MED ORDER — KCL IN DEXTROSE-NACL 40-5-0.9 MEQ/L-%-% IV SOLN
INTRAVENOUS | Status: DC
  Filled 2021-06-14 (×3): qty 1000

## 2021-06-14 MED ORDER — ONDANSETRON HCL 4 MG/2ML IJ SOLN
4.0000 mg | INTRAMUSCULAR | Status: AC
Start: 2021-06-14 — End: 2021-06-14
  Administered 2021-06-14: 4 mg via INTRAVENOUS
  Filled 2021-06-14: qty 2

## 2021-06-14 NOTE — Assessment & Plan Note (Signed)
IV potassium riders ?Monitor potassium and replete as necessary ?Cardiac monitoring ?

## 2021-06-14 NOTE — Assessment & Plan Note (Addendum)
Symptomatic for intractable nausea and decreased oral intake ?Started on Paxlovid from 3/14-3/19, but unable to tolerate p.o. ?We will hold off on antivirals as outside of window ?Routine COVID precautions ?

## 2021-06-14 NOTE — Assessment & Plan Note (Addendum)
Ice chips for now.   ?IV antiemetics ?

## 2021-06-14 NOTE — Assessment & Plan Note (Signed)
Sliding scale insulin coverage 

## 2021-06-14 NOTE — H&P (Signed)
?History and Physical  ? ? ?Patient: Ashley Tanner HQP:[id number] DOB: 08-08-1940 ?DOA: 06/13/2021 ?DOS: the patient was seen and examined on 06/14/2021 ?PCP: Dion Body, MD  ?Patient coming from: Home ? ?Chief Complaint:  ?Chief Complaint  ?Patient presents with  ? Nausea  ?  Covid + Tuesday  ? ? ?HPI: Ashley Tanner is a 81 y.o. female with medical history significant for DM, HTN, diagnosed with COVID on 3/14 after visit to the ED after developing symptoms of cough, nausea and decreased oral intake on 06/07/2021, started on paxlovid, who returns to the ED with worsening of her nausea, dry heaving, inability to eat or drink and generalized weakness.  She denies cough or shortness of breath. ?ED course: Vitals in the ED with BP 156/86, otherwise unremarkable.  Blood work significant for potassium of 2.9.  Mag was 1.7.  LFTs and lipase WNL, WBC 3700.  EKG with NSR at rate of 65 with nonspecific ST-T wave changes.  Acute abdominal series nonacute.  Patient treated with potassium chloride 10 mill equivalent riders, IV fluid bolus, Zofran.  Hospitalist consulted for admission.  ? ?Review of Systems: As mentioned in the history of present illness. All other systems reviewed and are negative. ?Past Medical History:  ?Diagnosis Date  ? Arthritis   ? Asystole ([place])   ? electrolyte imbalance  ? B12 deficiency   ? Chronic diarrhea   ? Diabetes mellitus without complication ([place])   ? borderline  ? GERD (gastroesophageal reflux disease)   ? History of colon polyps   ? History of hiatal hernia   ? History of kidney stones   ? History of supraventricular tachycardia   ? Hoarseness of voice   ? Hypertension   ? Osteoarthritis   ? Other constipation   ? Peripheral neuropathy   ? Psoriasis   ? ?Past Surgical History:  ?Procedure Laterality Date  ? ABDOMINAL HYSTERECTOMY    ? BREAST BIOPSY Left 01/29/2003  ? Fibrocystic changes, ductal adenosis, microcalcifications  ? BREAST BIOPSY Left 03/29/2017  ? Affirm Bx- SUPERIOR,  CENTRAL, POSTERIOR; STEREOTACTIC BIOPSY: ORGANIZING FAT NECROSIS  ? BREAST SURGERY Left November 2004  ? Fibrocystic changes, ductal adenosis, microcalcifications  ? COLONOSCOPY  2013  ? Dr Vira Agar  ? COLONOSCOPY WITH PROPOFOL N/A 10/29/2017  ? Procedure: COLONOSCOPY WITH PROPOFOL;  Surgeon: Manya Silvas, MD;  Location: [facility] ENDOSCOPY;  Service: Endoscopy;  Laterality: N/A;  ? CYSTOSCOPY/URETEROSCOPY/HOLMIUM LASER/STENT PLACEMENT Right 02/25/2019  ? Procedure: CYSTOSCOPY/URETEROSCOPY//STENT PLACEMENT;  Surgeon: Billey Co, MD;  Location: [facility] ORS;  Service: Urology;  Laterality: Right;  ? CYSTOSCOPY/URETEROSCOPY/HOLMIUM LASER/STENT PLACEMENT Right 03/17/2019  ? Procedure: CYSTOSCOPY/URETEROSCOPY/HOLMIUM LASER/STENT Exchange;  Surgeon: Billey Co, MD;  Location: [facility] ORS;  Service: Urology;  Laterality: Right;  ? ESOPHAGOGASTRODUODENOSCOPY    ? EYE SURGERY    ? Cataract surgery  ? FLEXIBLE SIGMOIDOSCOPY    ? HERNIA REPAIR  March 2011  ? [facility], intrathoracic hiatal hernia  ? KNEE ARTHROSCOPY Left 2014  ? ?Social History:  reports that she has never smoked. She has never used smokeless tobacco. She reports that she does not drink alcohol and does not use drugs. ? ?Allergies  ?Allergen Reactions  ? Codeine Nausea Only  ?  Nausea and dizziness  ? Lisinopril Other (See Comments)  ?  Weakness and dizziness  ? Morphine And Related Nausea And Vomiting  ? Zoloft [Sertraline Hcl] Other (See Comments)  ?  Dizziness  ? ? ?Family History  ?Problem Relation Age of Onset  ?  Prostate cancer Neg Hx   ? Bladder Cancer Neg Hx   ? Kidney cancer Neg Hx   ? ? ?Prior to Admission medications   ?Medication Sig Start Date End Date Taking? Authorizing Provider  ?aspirin EC 81 MG tablet Take 81 mg by mouth daily.    [provider]  ?calcium-vitamin D (OSCAL WITH D) 500-200 MG-UNIT per tablet Take 1 tablet by mouth daily.    [provider]  ?docusate sodium (COLACE) 100 MG capsule Take 100 mg by mouth 2 (two)  times daily.    [provider]  ?hydrochlorothiazide (HYDRODIURIL) 12.5 MG tablet Take 12.5 mg by mouth daily.    [provider]  ?metoprolol succinate (TOPROL-XL) 25 MG 24 hr tablet Take by mouth. 05/18/19   [provider]  ?mirtazapine (REMERON) 7.5 MG tablet Take 7.5 mg by mouth at bedtime. 03/02/19   [provider]  ?nirmatrelvir/ritonavir EUA (PAXLOVID) 20 x 150 MG & 10 x '100MG'$  TABS Take 3 tablets by mouth 2 (two) times daily for 5 days. Patient GFR is >60. ?Take nirmatrelvir (150 mg) two tablets twice daily for 5 days and ritonavir (100 mg) one tablet twice daily for 5 days. 06/10/21 06/15/21  Nance Pear, MD  ?ondansetron (ZOFRAN) 4 MG tablet Take 1 tablet (4 mg total) by mouth every 8 (eight) hours as needed. 06/10/21   Nance Pear, MD  ?rosuvastatin (CRESTOR) 20 MG tablet Take by mouth. 05/18/19   [provider]  ? ? ?Physical Exam: ?Vitals:  ? 06/13/21 2204 06/13/21 2300 06/14/21 0000  ?BP: (!) 156/86 (!) 147/77 (!) 157/81  ?Pulse: 61 66 72  ?Resp: '20 16 20  '$ ?Temp: 97.7 ?F (36.5 ?C)    ?TempSrc: Oral    ?SpO2: 96% 96% 97%  ? ?Physical Exam ?Vitals and nursing note reviewed.  ?Constitutional:   ?   General: She is not in acute distress. ?   Appearance: She is ill-appearing.  ?HENT:  ?   Head: Normocephalic and atraumatic.  ?Cardiovascular:  ?   Rate and Rhythm: Normal rate and regular rhythm.  ?   Pulses: Normal pulses.  ?   Heart sounds: Normal heart sounds. No murmur heard. ?Pulmonary:  ?   Effort: Pulmonary effort is normal.  ?   Breath sounds: Normal breath sounds. No wheezing or rhonchi.  ?Abdominal:  ?   General: Bowel sounds are normal.  ?   Palpations: Abdomen is soft.  ?   Tenderness: There is no abdominal tenderness.  ?Musculoskeletal:     ?   General: No swelling or tenderness. Normal range of motion.  ?   Cervical back: Normal range of motion and neck supple.  ?Skin: ?   General: Skin is warm and dry.  ?Neurological:  ?   General: No focal  deficit present.  ?   Mental Status: She is alert. Mental status is at baseline.  ?Psychiatric:     ?   Mood and Affect: Mood normal.     ?   Behavior: Behavior normal.  ? ? ? ?Data Reviewed: ?Relevant notes from primary care and specialist visits, past discharge summaries as available in EHR, including Care Everywhere. ?Prior diagnostic testing as pertinent to current admission diagnoses ?Updated medications and problem lists for reconciliation ?ED course, including vitals, labs, imaging, treatment and response to treatment ?Triage notes, nursing and pharmacy notes and ED provider's notes ?Notable results as noted in HPI ? ? ?Assessment and Plan: ?* Hypokalemia ?IV potassium riders ?Monitor potassium and  replete as necessary ?Cardiac monitoring ? ?COVID-19 virus infection ?Symptomatic for intractable nausea and decreased oral intake ?Started on Paxlovid from 3/14-3/19, but unable to tolerate p.o. ?We will hold off on antivirals as outside of window ?Routine COVID precautions ? ?Inadequate oral intake ?IV hydration until able to tolerate orally ? ?Nausea in adult ?Ice chips for now.   ?IV antiemetics ? ?HTN (hypertension) ?IV hydralazine as needed until able to tolerate p.o. ? ?Diabetes mellitus, type 2 ([place]) ?Sliding scale insulin coverage ? ? ? ? ? ? ?Advance Care Planning:   Code Status: Prior  ? ?Consults: none ? ?Family Communication: daughter at bedside ? ?Severity of Illness: ?The appropriate patient status for this patient is INPATIENT. Inpatient status is judged to be reasonable and necessary in order to provide the required intensity of service to ensure the patient's safety. The patient's presenting symptoms, physical exam findings, and initial radiographic and laboratory data in the context of their chronic comorbidities is felt to place them at high risk for further clinical deterioration. Furthermore, it is not anticipated that the patient will be medically stable for discharge from the hospital within  2 midnights of admission.  ? ?* I certify that at the point of admission it is my clinical judgment that the patient will require inpatient hospital care spanning beyond 2 midnights from the point of admissi

## 2021-06-14 NOTE — Assessment & Plan Note (Signed)
IV hydralazine as needed until able to tolerate p.o. ?

## 2021-06-14 NOTE — Consult Note (Signed)
Chaplain met with pt. per consult requesting prayer. Pt. shared concerns about getting better. She is grateful she has her nephews support along with her Darrick Meigs friends and Doristine Bosworth who continues to check on her.  ?

## 2021-06-14 NOTE — Assessment & Plan Note (Signed)
IV hydration until able to tolerate orally ?

## 2021-06-14 NOTE — Progress Notes (Signed)
She complains of generalized weakness, nausea and dry heaves.  No vomiting, cough, shortness of breath or chest pain.  Signs are stable.  She is not hypoxic.  Continue IV fluids for hydration.  Replete potassium and monitor levels. Plan for discharge to home tomorrow.  She is apprehensive about going home because she lives alone.  Encouraged ambulation with assistance. ?

## 2021-06-15 DIAGNOSIS — R110 Nausea: Secondary | ICD-10-CM

## 2021-06-15 LAB — GLUCOSE, CAPILLARY
Glucose-Capillary: 115 mg/dL — ABNORMAL HIGH (ref 70–99)
Glucose-Capillary: 127 mg/dL — ABNORMAL HIGH (ref 70–99)
Glucose-Capillary: 134 mg/dL — ABNORMAL HIGH (ref 70–99)

## 2021-06-15 LAB — CBC WITH DIFFERENTIAL/PLATELET
Abs Immature Granulocytes: 0.02 10*3/uL (ref 0.00–0.07)
Basophils Absolute: 0 10*3/uL (ref 0.0–0.1)
Basophils Relative: 0 %
Eosinophils Absolute: 0 10*3/uL (ref 0.0–0.5)
Eosinophils Relative: 0 %
HCT: 36.5 % (ref 36.0–46.0)
Hemoglobin: 12.4 g/dL (ref 12.0–15.0)
Immature Granulocytes: 0 %
Lymphocytes Relative: 37 %
Lymphs Abs: 2 10*3/uL (ref 0.7–4.0)
MCH: 30.8 pg (ref 26.0–34.0)
MCHC: 34 g/dL (ref 30.0–36.0)
MCV: 90.6 fL (ref 80.0–100.0)
Monocytes Absolute: 0.6 10*3/uL (ref 0.1–1.0)
Monocytes Relative: 10 %
Neutro Abs: 2.9 10*3/uL (ref 1.7–7.7)
Neutrophils Relative %: 53 %
Platelets: 154 10*3/uL (ref 150–400)
RBC: 4.03 MIL/uL (ref 3.87–5.11)
RDW: 13.1 % (ref 11.5–15.5)
WBC: 5.5 10*3/uL (ref 4.0–10.5)
nRBC: 0 % (ref 0.0–0.2)

## 2021-06-15 LAB — BASIC METABOLIC PANEL
Anion gap: 4 — ABNORMAL LOW (ref 5–15)
BUN: 6 mg/dL — ABNORMAL LOW (ref 8–23)
CO2: 24 mmol/L (ref 22–32)
Calcium: 8.5 mg/dL — ABNORMAL LOW (ref 8.9–10.3)
Chloride: 110 mmol/L (ref 98–111)
Creatinine, Ser: 0.72 mg/dL (ref 0.44–1.00)
GFR, Estimated: >60 mL/min (ref >60)
Glucose, Bld: 129 mg/dL — ABNORMAL HIGH (ref 70–99)
Potassium: 3.7 mmol/L (ref 3.5–5.1)
Sodium: 138 mmol/L (ref 135–145)

## 2021-06-15 LAB — MAGNESIUM: Magnesium: 1.5 mg/dL — ABNORMAL LOW (ref 1.7–2.4)

## 2021-06-15 NOTE — Evaluation (Signed)
Physical Therapy Evaluation ?Patient Details ?Name: Ashley Tanner ?MRN: [id number] ?DOB: 1940/10/27 ?Today's Date: 06/15/2021 ? ?History of Present Illness ? presented to ER secondary to nausea, cough, decreased PO intake; admitted for management of hypokalemia, Covid-19  ?Clinical Impression ? Patient resting in bed upon arrival to room; alert and oriented, follows commands and agreeable to participation with session.  Denies pain at this time.  Bilat UE/LE strength and ROM generally deconditioned due to acute illness, but grossly symmetrical and WFL for basic transfers and mobility.  Able to complete bed mobility with min assist; sit/stand, basic transfers and gait (39') with RW, cga/min assist.  Demonstrates mild forward trunk flexion, maintains RW slightly ahead of BOS; reciprocal stepping pattern with fair step height/length.  Negotiates obstacles, turns and changes of direction without LOB or safety concern.  Do recommend continued use of RW at this time for all gait/mobility efforts.  Additional distance/activity deferred at this time, as CNA present for ADL/hygiene.  Will continue to assess/progress gait distance next session. ?Would benefit from skilled PT to address above deficits and promote optimal return to PLOF.; Recommend transition to HHPT upon discharge from acute hospitalization. ?   ?   ? ?Recommendations for follow up therapy are one component of a multi-disciplinary discharge planning process, led by the attending physician.  Recommendations may be updated based on patient status, additional functional criteria and insurance authorization. ? ?Follow Up Recommendations Home health PT ? ?  ?Assistance Recommended at Discharge PRN  ?Patient can return home with the following ? A little help with walking and/or transfers;A little help with bathing/dressing/bathroom;Help with stairs or ramp for entrance ? ?  ?Equipment Recommendations Rolling walker (2 wheels)  ?Recommendations for Other Services ?     ?  ?Functional Status Assessment Patient has had a recent decline in their functional status and demonstrates the ability to make significant improvements in function in a reasonable and predictable amount of time.  ? ?  ?Precautions / Restrictions Precautions ?Precautions: Fall ?Restrictions ?Weight Bearing Restrictions: No  ? ?  ? ?Mobility ? Bed Mobility ?Overal bed mobility: Needs Assistance ?Bed Mobility: Supine to Sit ?  ?  ?Supine to sit: Min assist ?  ?  ?General bed mobility comments: for truncal elevation ?  ? ?Transfers ?Overall transfer level: Needs assistance ?Equipment used: Rolling walker (2 wheels) ?Transfers: Sit to/from Stand ?Sit to Stand: Min assist ?  ?  ?  ?  ?  ?  ?  ? ?Ambulation/Gait ?Ambulation/Gait assistance: Min guard, Min assist ?Gait Distance (Feet): 30 Feet ?Assistive device: Rolling walker (2 wheels) ?  ?  ?  ?  ?General Gait Details: mild forward trunk flexion, maintains RW slightly ahead of BOS; reciprocal stepping pattern with fair step height/length.  Negotiates obstacles, turns and changes of direction without LOB or safety concern.  Do recommend continued use of RW at this time for all gait/mobility efforts ? ?Stairs ?  ?  ?  ?  ?  ? ?Wheelchair Mobility ?  ? ?Modified Rankin (Stroke Patients Only) ?  ? ?  ? ?Balance Overall balance assessment: Needs assistance ?Sitting-balance support: No upper extremity supported, Feet supported ?Sitting balance-Leahy Scale: Good ?  ?  ?Standing balance support: Bilateral upper extremity supported ?Standing balance-Leahy Scale: Fair ?  ?  ?  ?  ?  ?  ?  ?  ?  ?  ?  ?  ?   ? ? ? ?Pertinent Vitals/Pain Pain Assessment ?Pain Assessment: No/denies pain  ? ? ?  Home Living Family/patient expects to be discharged to:: Private residence ?Living Arrangements: Alone ?Available Help at Discharge: Family;Available PRN/intermittently ?Type of Home: House ?Home Access: Stairs to enter ?  ?Entrance Stairs-Number of Steps: 1 ?  ?Home Layout: One level ?Home  Equipment: Kasandra Knudsen - single point ?   ?  ?Prior Function Prior Level of Function : Independent/Modified Independent ?  ?  ?  ?  ?  ?  ?Mobility Comments: Mod indep for ADLs, household mobilization; intermittent use of SPC. Does endorse fall history (un-quantified) due to "toes pointing inwards" ?  ?  ? ? ?Hand Dominance  ?   ? ?  ?Extremity/Trunk Assessment  ? Upper Extremity Assessment ?Upper Extremity Assessment: Overall WFL for tasks assessed ?  ? ?Lower Extremity Assessment ?Lower Extremity Assessment: Overall WFL for tasks assessed (grossly 4-/5 throughout) ?  ? ?   ?Communication  ? Communication: No difficulties  ?Cognition Arousal/Alertness: Awake/alert ?Behavior During Therapy: [facility] for tasks assessed/performed ?Overall Cognitive Status: Within Functional Limits for tasks assessed ?  ?  ?  ?  ?  ?  ?  ?  ?  ?  ?  ?  ?  ?  ?  ?  ?  ?  ?  ? ?  ?General Comments   ? ?  ?Exercises    ? ?Assessment/Plan  ?  ?PT Assessment Patient needs continued PT services  ?PT Problem List Decreased strength;Decreased activity tolerance;Decreased balance;Decreased mobility;Decreased knowledge of use of DME;Decreased safety awareness;Decreased knowledge of precautions;Cardiopulmonary status limiting activity ? ?   ?  ?PT Treatment Interventions DME instruction;Gait training;Stair training;Functional mobility training;Therapeutic activities;Therapeutic exercise;Balance training;Patient/family education   ? ?PT Goals (Current goals can be found in the Care Plan section)  ?Acute Rehab PT Goals ?Patient Stated Goal: to get my strength back ?PT Goal Formulation: With patient ?Time For Goal Achievement: 06/29/21 ?Potential to Achieve Goals: Good ? ?  ?Frequency Min 2X/week ?  ? ? ?Co-evaluation   ?  ?  ?  ?  ? ? ?  ?AM-PAC PT "6 Clicks" Mobility  ?Outcome Measure Help needed turning from your back to your side while in a flat bed without using bedrails?: None ?Help needed moving from lying on your back to sitting on the side of a flat  bed without using bedrails?: A Little ?Help needed moving to and from a bed to a chair (including a wheelchair)?: A Little ?Help needed standing up from a chair using your arms (e.g., wheelchair or bedside chair)?: A Little ?Help needed to walk in hospital room?: A Little ?Help needed climbing 3-5 steps with a railing? : A Little ?6 Click Score: 19 ? ?  ?End of Session Equipment Utilized During Treatment: Gait belt ?Activity Tolerance: Patient tolerated treatment well ?Patient left: in chair;with call bell/phone within reach;with nursing/sitter in room;with chair alarm set ?Nurse Communication: Mobility status ?PT Visit Diagnosis: Muscle weakness (generalized) (M62.81);Difficulty in walking, not elsewhere classified (R26.2) ?  ? ?Time: 5643-3295 ?PT Time Calculation (min) (ACUTE ONLY): 17 min ? ? ?Charges:   PT Evaluation ?$PT Eval Moderate Complexity: 1 Mod ?  ?  ?   ? ?Zayvier Caravello H. Owens Shark, PT, DPT, NCS ?06/15/21, 12:08 PM ?[phone] ? ? ?

## 2021-06-15 NOTE — Discharge Summary (Signed)
?Physician Discharge Summary ?  ?Patient: Ashley Tanner MRN: [id number] DOB: [birth year]  ?Admit date:     06/13/2021  ?Discharge date:   ?Discharge Physician: Jennye Boroughs  ? ?PCP: Dion Body, MD  ? ?Recommendations at discharge:  ? ?Follow-up with PCP in 1 week ? ?Discharge Diagnoses: ?Principal Problem: ?  Hypokalemia ?Active Problems: ?  COVID-19 virus infection ?  Nausea in adult ?  Inadequate oral intake ?  Diabetes mellitus, type 2 ([place]) ?  HTN (hypertension) ? ?Resolved Problems: ?  * No resolved hospital problems. * ? ?Hospital Course: ? ?Ashley Tanner is an 81 year old woman with medical history significant for hypertension, type 2 diabetes mellitus, recent diagnosis of COVID-19 infection on 06/10/2021 for which she was started on Paxlovid.  She developed cough, nausea, poor oral intake and generalized weakness from 06/07/2021.  Her symptoms did not get any better even after taking Paxlovid (she still had about 3 doses left).  Her potassium was low on admission. ? ?She was admitted to the hospital for hypokalemia, poor oral intake, generalized weakness and dehydration in the setting of recent COVID-19 infection.  She was treated with IV fluids and potassium was repleted.  She was evaluated by PT who recommended home health therapy.  Her condition has improved and she is deemed stable for discharge to home today. ? ? ? ?  ? ? ?Consultants: None ?Procedures performed: None ?Disposition: Home ?Diet recommendation:  ?Discharge Diet Orders (From admission, onward)  ? ?  Start     Ordered  ? 06/15/21 0000  Diet - low sodium heart healthy       ? 06/15/21 1348  ? ?  ?  ? ?  ? ?Cardiac diet ?DISCHARGE MEDICATION: ?Allergies as of 06/15/2021   ? ?   Reactions  ? Codeine Nausea Only  ? Nausea and dizziness  ? Lisinopril Other (See Comments)  ? Weakness and dizziness  ? Morphine And Related Nausea And Vomiting  ? Zoloft [sertraline Hcl] Other (See Comments)  ? Dizziness  ? ?  ? ?  ?Medication List  ?   ? ?STOP taking these medications   ? ?mirtazapine 7.5 MG tablet ?Commonly known as: REMERON ?  ?nirmatrelvir/ritonavir EUA 20 x 150 MG & 10 x '100MG'$  Tabs ?Commonly known as: PAXLOVID ?  ? ?  ? ?TAKE these medications   ? ?aspirin 81 MG EC tablet ?Take 81 mg by mouth daily. ?  ?calcium-vitamin D 500-200 MG-UNIT tablet ?Commonly known as: OSCAL WITH D ?Take 1 tablet by mouth daily. ?  ?docusate sodium 100 MG capsule ?Commonly known as: COLACE ?Take 100 mg by mouth 2 (two) times daily. ?  ?hydrochlorothiazide 12.5 MG tablet ?Commonly known as: HYDRODIURIL ?Take 12.5 mg by mouth daily. ?  ?metoprolol succinate 25 MG 24 hr tablet ?Commonly known as: TOPROL-XL ?Take by mouth. ?  ?nitroGLYCERIN 0.4 MG SL tablet ?Commonly known as: NITROSTAT ?Place under the tongue. ?  ?ondansetron 4 MG tablet ?Commonly known as: Zofran ?Take 1 tablet (4 mg total) by mouth every 8 (eight) hours as needed. ?  ?polyethylene glycol powder 17 GM/SCOOP powder ?Commonly known as: GLYCOLAX/MIRALAX ?Take 17 g by mouth daily as needed. ?  ?rosuvastatin 20 MG tablet ?Commonly known as: CRESTOR ?Take by mouth. ?  ? ?  ? ? ?Discharge Exam: ?Filed Weights  ? 06/15/21 0907  ?Weight: 63 kg  ? ?GEN: NAD ?SKIN: No rash ?EYES: EOMI ?ENT: MMM ?CV: RRR ?PULM: CTA B ?ABD: soft, ND, NT, +BS ?  CNS: AAO x 3, non focal ?EXT: No edema or tenderness ? ? ?Condition at discharge: good ? ?The results of significant diagnostics from this hospitalization (including imaging, microbiology, ancillary and laboratory) are listed below for reference.  ? ?Imaging Studies: ?DG Abdomen Acute W/Chest ? ?Result Date: 06/14/2021 ?CLINICAL DATA:  Cough, nausea/vomiting EXAM: DG ABDOMEN ACUTE WITH 1 VIEW CHEST COMPARISON:  CT chest abdomen pelvis dated 02/24/2019 FINDINGS: Lungs are clear.  No pleural effusion or pneumothorax. The heart is normal in size. Large hiatal hernia. Nonobstructive bowel gas pattern. No evidence of free air under the diaphragm on the upright view. Degenerative  changes of the visualized thoracolumbar spine. IMPRESSION: Negative abdominal radiographs.  No acute cardiopulmonary disease. Electronically Signed   By: Julian Hy M.D.   On: 06/14/2021 00:43   ? ?Microbiology: ?Results for orders placed or performed during the hospital encounter of 06/10/21  ?Resp Panel by RT-PCR (Flu A&B, Covid) Nasopharyngeal Swab     Status: Abnormal  ? Collection Time: 06/10/21  9:17 AM  ? Specimen: Nasopharyngeal Swab; Nasopharyngeal(NP) swabs in vial transport medium  ?Result Value Ref Range Status  ? SARS Coronavirus 2 by RT PCR POSITIVE (A) NEGATIVE Final  ?  Comment: (NOTE) ?SARS-CoV-2 target nucleic acids are DETECTED. ? ?The SARS-CoV-2 RNA is generally detectable in upper respiratory ?specimens during the acute phase of infection. Positive results are ?indicative of the presence of the identified virus, but do not rule ?out bacterial infection or co-infection with other pathogens not ?detected by the test. Clinical correlation with patient history and ?other diagnostic information is necessary to determine patient ?infection status. The expected result is Negative. ? ?Fact Sheet for Patients: ?EntrepreneurPulse.com.au ? ?Fact Sheet for Healthcare Providers: ?IncredibleEmployment.be ? ?This test is not yet approved or cleared by the [place] FDA and  ?has been authorized for detection and/or diagnosis of SARS-CoV-2 by ?FDA under an Emergency Use Authorization (EUA).  This EUA will ?remain in effect (meaning this test can be used) for the duration of  ?the COVID-19 declaration under Section 564(b)(1) of the [place] ct, 21 ?U.S.C. section 360bbb-3(b)(1), unless the authorization is ?terminated or revoked sooner. ? ?  ? Influenza A by PCR NEGATIVE NEGATIVE Final  ? Influenza B by PCR NEGATIVE NEGATIVE Final  ?  Comment: (NOTE) ?The Xpert Xpress SARS-CoV-2/FLU/RSV plus assay is intended as an aid ?in the diagnosis of influenza from Nasopharyngeal swab  specimens and ?should not be used as a sole basis for treatment. Nasal washings and ?aspirates are unacceptable for Xpert Xpress SARS-CoV-2/FLU/RSV ?testing. ? ?Fact Sheet for Patients: ?EntrepreneurPulse.com.au ? ?Fact Sheet for Healthcare Providers: ?IncredibleEmployment.be ? ?This test is not yet approved or cleared by the [place] FDA and ?has been authorized for detection and/or diagnosis of SARS-CoV-2 by ?FDA under an Emergency Use Authorization (EUA). This EUA will remain ?in effect (meaning this test can be used) for the duration of the ?COVID-19 declaration under Section 564(b)(1) of the Act, 21 U.S.C. ?section 360bbb-3(b)(1), unless the authorization is terminated or ?revoked. ? ?Performed at [facility], [place], ?[place] [address] ?  ? ? ?Labs: ?CBC: ?Recent Labs  ?Lab 06/10/21 ?9678 06/13/21 ?2210 06/15/21 ?9381  ?WBC 3.5* 3.7* 5.5  ?NEUTROABS  --  2.0 2.9  ?HGB 13.8 13.5 12.4  ?HCT 41.7 39.2 36.5  ?MCV 92.3 89.7 90.6  ?PLT 151 141* 154  ? ?Basic Metabolic Panel: ?Recent Labs  ?Lab 06/10/21 ?0175 06/13/21 ?2210 06/13/21 ?2328 06/14/21 ?1025 06/15/21 ?8527  ?NA 136  136  --   --  138  ?K 3.3* 2.9*  --  3.6 3.7  ?CL 101 99  --   --  110  ?CO2 26 25  --   --  24  ?GLUCOSE 133* 158*  --   --  129*  ?BUN 21 10  --   --  6*  ?CREATININE 0.84 0.77  --   --  0.72  ?CALCIUM 8.9 8.6*  --   --  8.5*  ?MG  --   --  1.7  --  1.5*  ? ?Liver Function Tests: ?Recent Labs  ?Lab 06/13/21 ?2210  ?AST 21  ?ALT 14  ?ALKPHOS 71  ?BILITOT 1.4*  ?PROT 6.8  ?ALBUMIN 3.7  ? ?CBG: ?Recent Labs  ?Lab 06/14/21 ?1956 06/14/21 ?2341 06/15/21 ?0421 06/15/21 ?5784 06/15/21 ?1148  ?GLUCAP 106* 119* 115* 127* 134*  ? ? ?Discharge time spent: greater than 30 minutes. ? ?Signed: ?Jennye Boroughs, MD ?[facility] ?06/15/2021 ?

## 2021-06-15 NOTE — Plan of Care (Signed)
?  Problem: Clinical Measurements: ?Goal: Ability to maintain clinical measurements within normal limits will improve ?Outcome: Progressing ?Goal: Will remain free from infection ?Outcome: Progressing ?Goal: Diagnostic test results will improve ?Outcome: Progressing ?Goal: Respiratory complications will improve ?Outcome: Progressing ?Goal: Cardiovascular complication will be avoided ?Outcome: Progressing ?  ?Problem: Safety: ?Goal: Ability to remain free from injury will improve ?Outcome: Progressing ?  ?Pt is involved in and agrees with the plan of care. V/S stable. Denies any pain or SOB. Pt on RA.  ?

## 2021-06-15 NOTE — TOC Initial Note (Signed)
Transition of Care (TOC) - Initial/Assessment Note  ? ? ?Patient Details  ?Name: Ashley Tanner ?MRN: [id number] ?Date of Birth: 1940/06/24 ? ?Transition of Care (TOC) CM/SW Contact:    ?Jehieli Brassell E Dayja Loveridge, LCSW ?Phone Number: ?06/15/2021, 2:25 PM ? ?Clinical Narrative:                PT recommending HH and RW. ?Spoke to patient via phone. ?Patient lives alone and drives herself to appointments at baseline. ?Nephew will pick up at time of DC. ?PCP is Dr. Netty Starring. Pharmacy is [facility]. Patient has a RW. ?No HH or SNF history.  ?Confirmed home address.  ?Patient is agreeable to HHPT. Referral accepted by [facility] with Alvis Lemmings.  ? ? ?Expected Discharge Plan: [place] ?Barriers to Discharge: Continued Medical Work up ? ? ?Patient Goals and CMS Choice ?Patient states their goals for this hospitalization and ongoing recovery are:: home with home health PT ?CMS Medicare.gov Compare Post Acute Care list provided to:: Patient ?Choice offered to / list presented to : Patient ? ?Expected Discharge Plan and Services ?Expected Discharge Plan: [place] ?  ?  ?  ?Living arrangements for the past 2 months: [place] ?Expected Discharge Date: 06/15/21               ?  ?  ?  ?  ?  ?HH Arranged: PT ?[place] Agency: [place] ?Date HH Agency Contacted: 06/15/21 ?  ?Representative spoke with at [place]: Tommi Rumps ? ?Prior Living Arrangements/Services ?Living arrangements for the past 2 months: [place] ?Lives with:: Self ?Patient language and need for interpreter reviewed:: Yes ?Do you feel safe going back to the place where you live?: Yes      ?Need for Family Participation in Patient Care: Yes (Comment) ?Care giver support system in place?: Yes (comment) ?Current home services: DME ?Criminal Activity/Legal Involvement Pertinent to Current Situation/Hospitalization: No - Comment as needed ? ?Activities of Daily Living ?Home Assistive Devices/Equipment: Cane (specify quad  or straight), Walker (specify type) ?ADL Screening (condition at time of admission) ?Patient's cognitive ability adequate to safely complete daily activities?: Yes ?Is the patient deaf or have difficulty hearing?: Yes ?Does the patient have difficulty seeing, even when wearing glasses/contacts?: No ?Does the patient have difficulty concentrating, remembering, or making decisions?: No ?Patient able to express need for assistance with ADLs?: Yes ?Does the patient have difficulty dressing or bathing?: No ?Independently performs ADLs?: Yes (appropriate for developmental age) ?Does the patient have difficulty walking or climbing stairs?: Yes ?Weakness of Legs: Both ?Weakness of Arms/Hands: Both ? ?Permission Sought/Granted ?Permission sought to share information with : Customer service manager ?Permission granted to share information with : Yes, Verbal Permission Granted ?   ? Permission granted to share info w AGENCY: [place] agencies ?   ?   ? ?Emotional Assessment ?  ?  ?  ?Orientation: : Oriented to Self, Oriented to Place, Oriented to  Time, Oriented to Situation ?Alcohol / Substance Use: Not Applicable ?Psych Involvement: No (comment) ? ?Admission diagnosis:  Gastroenteritis [K52.9] ?Gastroenteritis due to COVID-19 virus [U07.1, A08.39] ?COVID-19 [U07.1] ?Patient Active Problem List  ? Diagnosis Date Noted  ? COVID-19 virus infection 06/14/2021  ? Inadequate oral intake 06/14/2021  ? Cardiac arrest ([place]) 02/24/2019  ? Right renal stone 02/24/2019  ? Nausea in adult 02/24/2019  ? Syncope 02/24/2019  ? Hypokalemia 02/24/2019  ? Acute lower UTI 02/24/2019  ? Diabetes mellitus, type 2 (  [place]) 04/19/2018  ? HTN (hypertension) 04/19/2018  ? Proctocolitis 04/19/2018  ? Slow transit constipation 08/19/2017  ? Encounter for general adult medical examination without abnormal findings 06/16/2016  ? Situational anxiety 06/16/2016  ? Sliding hiatal hernia 01/30/2016  ? Hoarseness of voice 12/10/2015  ? Borderline diabetes  mellitus 06/17/2015  ? Vaccine counseling 06/10/2015  ? Breast mass   ? Abnormal mammogram 08/10/2012  ? ?PCP:  Dion Body, MD ?Pharmacy:   ?[id number] [place] [place] - [address] ?[address] ?[place]?Phone: [phone]: [phone]? ?[id number] [place] [address] [place]: [phone]: [phone] ? ? ? ?Social Determinants of Health (SDOH) Interventions ?  ? ?Readmission Risk Interventions ?Readmission Risk Prevention Plan 06/15/2021  ?Post Dischage Appt Complete  ?Medication Screening Complete  ?Transportation Screening Complete  ?Some recent data might be hidden  ? ? ? ?

## 2021-06-15 NOTE — TOC CM/SW Note (Signed)
?  Transition of Care (TOC) Screening Note ? ? ?Patient Details  ?Name: Ashley Tanner ?Date of Birth: Mar 01, 1941 ? ? ?Transition of Care (TOC) CM/SW Contact:    ?Rheannon Cerney E Segundo Makela, LCSW ?Phone Number: ?06/15/2021, 11:57 AM ? ? ? ?Transition of Care Department [facility]) has reviewed patient and no TOC needs have been identified at this time. We will continue to monitor patient advancement through interdisciplinary progression rounds. If new patient transition needs arise, please place a TOC consult. ? ? ?

## 2021-06-16 LAB — HEMOGLOBIN A1C
Hgb A1c MFr Bld: 5.6 % (ref 4.8–5.6)
Mean Plasma Glucose: 114 mg/dL

## 2021-06-19 DIAGNOSIS — Z8639 Personal history of other endocrine, nutritional and metabolic disease: Secondary | ICD-10-CM | POA: Diagnosis not present

## 2021-06-19 DIAGNOSIS — Z8616 Personal history of COVID-19: Secondary | ICD-10-CM | POA: Diagnosis not present

## 2021-06-26 DIAGNOSIS — I340 Nonrheumatic mitral (valve) insufficiency: Secondary | ICD-10-CM | POA: Diagnosis not present

## 2021-06-26 DIAGNOSIS — I2510 Atherosclerotic heart disease of native coronary artery without angina pectoris: Secondary | ICD-10-CM | POA: Diagnosis not present

## 2021-06-26 DIAGNOSIS — I351 Nonrheumatic aortic (valve) insufficiency: Secondary | ICD-10-CM | POA: Diagnosis not present

## 2021-06-26 DIAGNOSIS — I25810 Atherosclerosis of coronary artery bypass graft(s) without angina pectoris: Secondary | ICD-10-CM | POA: Diagnosis not present

## 2021-06-26 DIAGNOSIS — I10 Essential (primary) hypertension: Secondary | ICD-10-CM | POA: Diagnosis not present

## 2021-06-26 DIAGNOSIS — E782 Mixed hyperlipidemia: Secondary | ICD-10-CM | POA: Diagnosis not present

## 2021-07-10 DIAGNOSIS — R7303 Prediabetes: Secondary | ICD-10-CM | POA: Diagnosis not present

## 2021-07-10 DIAGNOSIS — I10 Essential (primary) hypertension: Secondary | ICD-10-CM | POA: Diagnosis not present

## 2021-07-17 DIAGNOSIS — I10 Essential (primary) hypertension: Secondary | ICD-10-CM | POA: Diagnosis not present

## 2021-07-17 DIAGNOSIS — R7303 Prediabetes: Secondary | ICD-10-CM | POA: Diagnosis not present

## 2021-07-17 DIAGNOSIS — I700 Atherosclerosis of aorta: Secondary | ICD-10-CM | POA: Diagnosis not present

## 2021-09-19 DIAGNOSIS — H35341 Macular cyst, hole, or pseudohole, right eye: Secondary | ICD-10-CM | POA: Diagnosis not present

## 2021-11-10 DIAGNOSIS — I340 Nonrheumatic mitral (valve) insufficiency: Secondary | ICD-10-CM | POA: Diagnosis not present

## 2021-11-10 DIAGNOSIS — I10 Essential (primary) hypertension: Secondary | ICD-10-CM | POA: Diagnosis not present

## 2021-11-10 DIAGNOSIS — R079 Chest pain, unspecified: Secondary | ICD-10-CM | POA: Diagnosis not present

## 2021-11-10 DIAGNOSIS — I2510 Atherosclerotic heart disease of native coronary artery without angina pectoris: Secondary | ICD-10-CM | POA: Diagnosis not present

## 2021-11-10 DIAGNOSIS — I25810 Atherosclerosis of coronary artery bypass graft(s) without angina pectoris: Secondary | ICD-10-CM | POA: Diagnosis not present

## 2021-11-10 DIAGNOSIS — I351 Nonrheumatic aortic (valve) insufficiency: Secondary | ICD-10-CM | POA: Diagnosis not present

## 2021-11-10 DIAGNOSIS — E782 Mixed hyperlipidemia: Secondary | ICD-10-CM | POA: Diagnosis not present

## 2021-12-23 DIAGNOSIS — K449 Diaphragmatic hernia without obstruction or gangrene: Secondary | ICD-10-CM | POA: Diagnosis not present

## 2021-12-23 DIAGNOSIS — I10 Essential (primary) hypertension: Secondary | ICD-10-CM | POA: Diagnosis not present

## 2021-12-23 DIAGNOSIS — R7303 Prediabetes: Secondary | ICD-10-CM | POA: Diagnosis not present

## 2021-12-23 DIAGNOSIS — J40 Bronchitis, not specified as acute or chronic: Secondary | ICD-10-CM | POA: Diagnosis not present

## 2022-01-13 DIAGNOSIS — I10 Essential (primary) hypertension: Secondary | ICD-10-CM | POA: Diagnosis not present

## 2022-01-13 DIAGNOSIS — R7303 Prediabetes: Secondary | ICD-10-CM | POA: Diagnosis not present

## 2022-01-13 DIAGNOSIS — I700 Atherosclerosis of aorta: Secondary | ICD-10-CM | POA: Diagnosis not present

## 2022-01-14 DIAGNOSIS — R7303 Prediabetes: Secondary | ICD-10-CM | POA: Diagnosis not present

## 2022-01-14 DIAGNOSIS — I10 Essential (primary) hypertension: Secondary | ICD-10-CM | POA: Diagnosis not present

## 2022-01-14 DIAGNOSIS — I700 Atherosclerosis of aorta: Secondary | ICD-10-CM | POA: Diagnosis not present

## 2022-01-28 DIAGNOSIS — Z0000 Encounter for general adult medical examination without abnormal findings: Secondary | ICD-10-CM | POA: Diagnosis not present

## 2022-01-28 DIAGNOSIS — I10 Essential (primary) hypertension: Secondary | ICD-10-CM | POA: Diagnosis not present

## 2022-01-28 DIAGNOSIS — R7303 Prediabetes: Secondary | ICD-10-CM | POA: Diagnosis not present

## 2022-01-28 DIAGNOSIS — I700 Atherosclerosis of aorta: Secondary | ICD-10-CM | POA: Diagnosis not present

## 2022-03-12 DIAGNOSIS — I351 Nonrheumatic aortic (valve) insufficiency: Secondary | ICD-10-CM | POA: Diagnosis not present

## 2022-03-12 DIAGNOSIS — I25810 Atherosclerosis of coronary artery bypass graft(s) without angina pectoris: Secondary | ICD-10-CM | POA: Diagnosis not present

## 2022-03-12 DIAGNOSIS — I10 Essential (primary) hypertension: Secondary | ICD-10-CM | POA: Diagnosis not present

## 2022-03-12 DIAGNOSIS — I340 Nonrheumatic mitral (valve) insufficiency: Secondary | ICD-10-CM | POA: Diagnosis not present

## 2022-03-12 DIAGNOSIS — I2510 Atherosclerotic heart disease of native coronary artery without angina pectoris: Secondary | ICD-10-CM | POA: Diagnosis not present

## 2022-03-12 DIAGNOSIS — E782 Mixed hyperlipidemia: Secondary | ICD-10-CM | POA: Diagnosis not present

## 2022-04-30 DIAGNOSIS — I700 Atherosclerosis of aorta: Secondary | ICD-10-CM | POA: Diagnosis not present

## 2022-04-30 DIAGNOSIS — I10 Essential (primary) hypertension: Secondary | ICD-10-CM | POA: Diagnosis not present

## 2022-04-30 DIAGNOSIS — R7303 Prediabetes: Secondary | ICD-10-CM | POA: Diagnosis not present

## 2022-05-07 DIAGNOSIS — I10 Essential (primary) hypertension: Secondary | ICD-10-CM | POA: Diagnosis not present

## 2022-05-07 DIAGNOSIS — I700 Atherosclerosis of aorta: Secondary | ICD-10-CM | POA: Diagnosis not present

## 2022-05-07 DIAGNOSIS — R7303 Prediabetes: Secondary | ICD-10-CM | POA: Diagnosis not present

## 2022-05-14 ENCOUNTER — Other Ambulatory Visit: Payer: Self-pay | Admitting: Cardiovascular Disease

## 2022-05-14 DIAGNOSIS — I351 Nonrheumatic aortic (valve) insufficiency: Secondary | ICD-10-CM

## 2022-07-03 DIAGNOSIS — Z03818 Encounter for observation for suspected exposure to other biological agents ruled out: Secondary | ICD-10-CM | POA: Diagnosis not present

## 2022-07-03 DIAGNOSIS — J3489 Other specified disorders of nose and nasal sinuses: Secondary | ICD-10-CM | POA: Diagnosis not present

## 2022-07-03 DIAGNOSIS — R6889 Other general symptoms and signs: Secondary | ICD-10-CM | POA: Diagnosis not present

## 2022-07-03 DIAGNOSIS — R110 Nausea: Secondary | ICD-10-CM | POA: Diagnosis not present

## 2022-07-03 DIAGNOSIS — R058 Other specified cough: Secondary | ICD-10-CM | POA: Diagnosis not present

## 2022-07-13 ENCOUNTER — Ambulatory Visit: Payer: PPO

## 2022-07-13 DIAGNOSIS — I361 Nonrheumatic tricuspid (valve) insufficiency: Secondary | ICD-10-CM | POA: Diagnosis not present

## 2022-07-13 DIAGNOSIS — I340 Nonrheumatic mitral (valve) insufficiency: Secondary | ICD-10-CM

## 2022-07-13 DIAGNOSIS — I351 Nonrheumatic aortic (valve) insufficiency: Secondary | ICD-10-CM

## 2022-07-20 ENCOUNTER — Encounter: Payer: Self-pay | Admitting: Cardiovascular Disease

## 2022-07-20 ENCOUNTER — Ambulatory Visit (INDEPENDENT_AMBULATORY_CARE_PROVIDER_SITE_OTHER): Payer: PPO | Admitting: Cardiovascular Disease

## 2022-07-20 VITALS — BP 122/68 | HR 52 | Ht 62.5 in | Wt 147.2 lb

## 2022-07-20 DIAGNOSIS — I10 Essential (primary) hypertension: Secondary | ICD-10-CM | POA: Diagnosis not present

## 2022-07-20 DIAGNOSIS — I2585 Chronic coronary microvascular dysfunction: Secondary | ICD-10-CM

## 2022-07-20 DIAGNOSIS — I351 Nonrheumatic aortic (valve) insufficiency: Secondary | ICD-10-CM

## 2022-07-20 DIAGNOSIS — I25119 Atherosclerotic heart disease of native coronary artery with unspecified angina pectoris: Secondary | ICD-10-CM | POA: Diagnosis not present

## 2022-07-20 DIAGNOSIS — E782 Mixed hyperlipidemia: Secondary | ICD-10-CM | POA: Diagnosis not present

## 2022-07-20 DIAGNOSIS — I340 Nonrheumatic mitral (valve) insufficiency: Secondary | ICD-10-CM

## 2022-07-20 NOTE — Assessment & Plan Note (Signed)
ECHO shows LVEF down to 48%, with h/o CAD, will order a stress test.

## 2022-07-20 NOTE — Progress Notes (Signed)
Cardiology Office Note   Date:  07/20/2022   ID:  Ashley Tanner, Ashley Tanner 12/30/1940, MRN [id number]  PCP:  Ashley Ivan, MD  Cardiologist:  Ashley Blackwater, MD      History of Present Illness: Ashley Tanner is a 82 y.o. female who presents for  Chief Complaint  Patient presents with   Follow-up    4 month follow up, Echo results.    Patient in office to discuss results of recent echo. Doing well, no chest pain/SOB    Past Medical History:  Diagnosis Date   Arthritis    Asystole    electrolyte imbalance   B12 deficiency    Chronic diarrhea    Diabetes mellitus without complication    borderline   GERD (gastroesophageal reflux disease)    History of colon polyps    History of hiatal hernia    History of kidney stones    History of supraventricular tachycardia    Hoarseness of voice    Hypertension    Osteoarthritis    Other constipation    Peripheral neuropathy    Psoriasis      Past Surgical History:  Procedure Laterality Date   ABDOMINAL HYSTERECTOMY     BREAST BIOPSY Left 01/29/2003   Fibrocystic changes, ductal adenosis, microcalcifications   BREAST BIOPSY Left 03/29/2017   Affirm Bx- SUPERIOR, CENTRAL, POSTERIOR; STEREOTACTIC BIOPSY: ORGANIZING FAT NECROSIS   BREAST SURGERY Left November 2004   Fibrocystic changes, ductal adenosis, microcalcifications   COLONOSCOPY  2013   Dr Mechele Collin   COLONOSCOPY WITH PROPOFOL N/A 10/29/2017   Procedure: COLONOSCOPY WITH PROPOFOL;  Surgeon: Scot Jun, MD;  Location: [facility] ENDOSCOPY;  Service: Endoscopy;  Laterality: N/A;   CYSTOSCOPY/URETEROSCOPY/HOLMIUM LASER/STENT PLACEMENT Right 02/25/2019   Procedure: CYSTOSCOPY/URETEROSCOPY//STENT PLACEMENT;  Surgeon: Sondra Come, MD;  Location: [facility] ORS;  Service: Urology;  Laterality: Right;   CYSTOSCOPY/URETEROSCOPY/HOLMIUM LASER/STENT PLACEMENT Right 03/17/2019   Procedure: CYSTOSCOPY/URETEROSCOPY/HOLMIUM LASER/STENT Exchange;  Surgeon: Sondra Come, MD;   Location: [facility] ORS;  Service: Urology;  Laterality: Right;   ESOPHAGOGASTRODUODENOSCOPY     EYE SURGERY     Cataract surgery   FLEXIBLE SIGMOIDOSCOPY     HERNIA REPAIR  March 2011   [facility], intrathoracic hiatal hernia   KNEE ARTHROSCOPY Left 2014     Current Outpatient Medications  Medication Sig Dispense Refill   aspirin 81 MG EC tablet Take 81 mg by mouth daily.     calcium-vitamin D (OSCAL WITH D) 500-200 MG-UNIT per tablet Take 1 tablet by mouth daily.     docusate sodium (COLACE) 100 MG capsule Take 100 mg by mouth 2 (two) times daily.     hydrochlorothiazide (HYDRODIURIL) 12.5 MG tablet Take 12.5 mg by mouth daily.     losartan (COZAAR) 25 MG tablet Take 1 tablet by mouth daily.     metoprolol succinate (TOPROL-XL) 25 MG 24 hr tablet Take by mouth.     nitroGLYCERIN (NITROSTAT) 0.4 MG SL tablet Place under the tongue.     ondansetron (ZOFRAN) 4 MG tablet Take 1 tablet (4 mg total) by mouth every 8 (eight) hours as needed. 20 tablet 0   polyethylene glycol powder (GLYCOLAX/MIRALAX) 17 GM/SCOOP powder Take 17 g by mouth daily as needed.     rosuvastatin (CRESTOR) 20 MG tablet Take by mouth.     No current facility-administered medications for this visit.    Allergies:   Codeine, Lisinopril, Morphine and related, and Zoloft [sertraline hcl]    Social History:  reports that she has never smoked. She has never used smokeless tobacco. She reports that she does not drink alcohol and does not use drugs.   Family History:  family history is not on file.    ROS:     Review of Systems  Constitutional: Negative.   HENT: Negative.    Eyes: Negative.   Respiratory: Negative.    Cardiovascular: Negative.   Gastrointestinal: Negative.   Genitourinary: Negative.   Musculoskeletal: Negative.   Skin: Negative.   Neurological: Negative.   Endo/Heme/Allergies: Negative.   Psychiatric/Behavioral: Negative.    All other systems reviewed and are negative.   All other systems are  reviewed and negative.   PHYSICAL EXAM: VS:  BP 122/68   Pulse (!) 52   Ht 5' 2.5" (1.588 m)   Wt 147 lb 3.2 oz (66.8 kg)   SpO2 97%   BMI 26.49 kg/m  , BMI Body mass index is 26.49 kg/m. Last weight:  Wt Readings from Last 3 Encounters:  07/20/22 147 lb 3.2 oz (66.8 kg)  06/15/21 138 lb 14.4 oz (63 kg)  06/10/21 153 lb 3.5 oz (69.5 kg)    Physical Exam Constitutional:      Appearance: Normal appearance.  Cardiovascular:     Rate and Rhythm: Normal rate and regular rhythm.     Heart sounds: Normal heart sounds.  Pulmonary:     Effort: Pulmonary effort is normal.     Breath sounds: Normal breath sounds.  Musculoskeletal:     Right lower leg: No edema.     Left lower leg: No edema.  Neurological:     Mental Status: She is alert.     EKG: none today  Recent Labs: No results found for requested labs within last 365 days.    Lipid Panel No results found for: "CHOL", "TRIG", "HDL", "CHOLHDL", "VLDL", "LDLCALC", "LDLDIRECT"    Other studies Reviewed: Patient: [id number] - Ashley Tanner DOB:  [birth year]    Date:  01/20/2021 11:30 Provider: Adrian Blackwater MD Encounter: ECHO   Page 2 REASON FOR VISIT  Visit for: Echocardiogram/Chest Pain   Sex:   Female   wt= 143   lbs.  BP= 130/70  Height= 63   inches.   TESTS  Imaging: Echocardiogram:  An echocardiogram in (2-d) mode was performed and in Doppler mode with color flow velocity mapping was performed. The aortic valve cusps are abnormal 1.2   cm, flow velocity 1.50   m/s, and systolic calculated mean flow gradient 5   mmHg. Mitral valve diastolic peak flow velocity E .528     m/s and E/A ratio 1.1. Aortic root diameter 3.0  cm. The LVOT internal diameter 1.9  cm and flow velocity was abnormal 1.06   m/s. LV systolic dimension 2.73   cm, diastolic 4.48  cm, posterior wall thickness 2.02    cm, fractional shortening 39.1  %, and EF 69.6  %. IVS thickness 1.15   cm. LA dimension 5.5 cm. Mitral Valve has Mild  Regurgitation. Aortic Valve has Trace Regurgitation. Tricuspid Valve has Mild Regurgitation.     ASSESSMENT  Technically difficult study due to body habitus.  Normal chamber sizes.  Normal left ventricular systolic function.  Mild left ventricular hypertrophy with GRADE 1 (relaxation abnormality) diastolic dysfunction.  Normal right ventricular systolic function.  Normal right ventricular diastolic function.  Normal left ventricular wall motion.  Normal right ventricular wall motion.  Mild tricuspid regurgitation.  Normal pulmonary artery pressure.  Mild mitral regurgitation.  Trace aortic regurgitation.  No pericardial effusion.  Mild LVH.   THERAPY   Referring physician: Laurier Nancy  Sonographer: Ashley Tanner.   Ashley Blackwater MD  Electronically signed by: Ashley Tanner     Date: 01/23/2021 11:21  Patient: [id number] - Ashley Tanner DOB:  1941-03-27   Date:  01/21/2021 07:30 Provider: Adrian Blackwater MD Encounter: [facility] STRESS TEST   Page 1 TESTS    [facility] [address] [place], [place] [address] [phone] STUDY:  Gated Stress / Rest Myocardial Perfusion Imaging Tomographic (SPECT) Including attenuation correction Wall Motion, Left Ventricular Ejection Fraction By Gated Technique.Persantine Stress Test. SEX: Female  WEIGHT: 143 lbs   HEIGHT: 63 in     ARMS UP: YES/NO                                                                                                                                                                                REFERRING PHYSICIAN: Dr.Danne Vasek [place] Flakes                                                                                                                                                                                                                       INDICATION FOR STUDY: CP  TECHNIQUE:  Approximately 20 minutes following the intravenous administration of 10.3 mCi of Tc-3m Sestamibi after stress testing in a reclined supine position with arms above their head if able to do so, gated SPECT imaging of the heart was performed. After about a 2hr break, the patient was injected intravenously with 31.3 mCi of Tc-38m Sestamibi.  Approximately 45 minutes later in the same position as stress imaging SPECT rest imaging of the heart was performed.  STRESS BY:  Ashley Blackwater, MD PROTOCOL:  Persantine  DOSE ADMIN:  7.3 cc  ROUTE OF ADMINISTRATION: IV                                                                            MAX PRED HR:  141                    85%: 120               75%:  106                                                                                                                  RESTING BP: 132/70   RESTING HR: 52  PEAK BP: 124/68   PEAK HR:  70                                                                   EXERCISE DURATION:    4 min injection                                            REASON FOR TEST TERMINATION:    Protocol end                                                                                                                              SYMPTOMS:  None                                                                                                                                                                                                          EKG RESULTS:  Sinus bradycardia. 56/min. Type 2, 2nd degree AV block. No significant ST changes with persantine.                                                             IMAGE QUALITY: Good  PERFUSION/WALL MOTION FINDINGS: EF = 88%. Small mild reversible basal inferolateral and mid inferolateral wall defects, normal wall motion.                                                                           IMPRESSION:  Equivocal stress test with normal LVEF, advise CCTA.                                                                                                                                                                                                                                                                                        Ashley Blackwater, MD Stress Interpreting Physician / Nuclear Interpreting Physician        Ashley Blackwater MD  Electronically signed by: Ashley Tanner     Date: 01/23/2021 11:04  Patient: [id number] - Ashley Tanner DOB:  1940/07/23   Date:  02/28/2019 11:00 Provider: Adrian Blackwater MD Encounter: ALL ANGIOGRAMS (CTA BRAIN, CAROTIDS, RENAL ARTERIES, PE)   Page 2 REASON FOR VISIT  Referred by Dr.Breaunna Gottlieb [place] Flakes.   TESTS  Imaging: Computed Tomographic Angiography:  Cardiac multidetector CT was performed paying particular attention to the coronary arteries for the diagnosis of: History of sudden cardiac arrest. Diagnostic Drugs:  Administered iohexol (Omnipaque) through an antecubital vein and images from the examination were analyzed for the presence and extent of coronary artery disease, using 3D image processing software. 100 mL of non-ionic contrast (Omnipaque) was used.   TEST CONCLUSIONS  Quality of study: Good  1-Calcium score: 632.1  2-Right dominant system.  3-RCA appears normal. Proximal LAD has 50% disease. Proximal LCX has 60% disease. Cannot rule out distal left main disease. Consider Cath.  Ashley Blackwater MD  Electronically signed by: Ashley Tanner     Date: 03/01/2019 07:43   ASSESSMENT AND PLAN:    ICD-10-CM   1. Coronary artery disease  involving native  coronary artery of native heart with angina pectoris  I25.119 MYOCARDIAL PERFUSION IMAGING   ECHO shows LVEF down to 40%, with h/o CAD, will stress test    2. Primary hypertension  I10 MYOCARDIAL PERFUSION IMAGING    3. Mixed hyperlipidemia  E78.2     4. Chronic coronary microvascular dysfunction  I25.85     5. Nonrheumatic aortic valve insufficiency  I35.1     6. Nonrheumatic mitral valve regurgitation  I34.0        Problem List Items Addressed This Visit       Cardiovascular and Mediastinum   HTN (hypertension)   Relevant Medications   losartan (COZAAR) 25 MG tablet   Other Relevant Orders   MYOCARDIAL PERFUSION IMAGING   Coronary artery disease involving native coronary artery of native heart with angina pectoris - Primary    ECHO shows LVEF down to 48%, with h/o CAD, will order a stress test.      Relevant Medications   losartan (COZAAR) 25 MG tablet   Other Relevant Orders   MYOCARDIAL PERFUSION IMAGING     Other   Mixed hyperlipidemia   Relevant Medications   losartan (COZAAR) 25 MG tablet   Other Visit Diagnoses     Chronic coronary microvascular dysfunction       Relevant Medications   losartan (COZAAR) 25 MG tablet   Nonrheumatic aortic valve insufficiency       Relevant Medications   losartan (COZAAR) 25 MG tablet   Nonrheumatic mitral valve regurgitation       Relevant Medications   losartan (COZAAR) 25 MG tablet        Disposition:   Return in about 4 weeks (around 08/17/2022).    Total time spent: 30 minutes  Signed,  Ashley Blackwater, MD  07/20/2022 11:13 AM    [facility]

## 2022-07-31 ENCOUNTER — Ambulatory Visit (INDEPENDENT_AMBULATORY_CARE_PROVIDER_SITE_OTHER): Payer: PPO

## 2022-07-31 DIAGNOSIS — I25119 Atherosclerotic heart disease of native coronary artery with unspecified angina pectoris: Secondary | ICD-10-CM

## 2022-07-31 DIAGNOSIS — I10 Essential (primary) hypertension: Secondary | ICD-10-CM

## 2022-07-31 MED ORDER — TECHNETIUM TC 99M SESTAMIBI GENERIC - CARDIOLITE
33.1000 | Freq: Once | INTRAVENOUS | Status: AC | PRN
  Administered 2022-07-31: 33.1 via INTRAVENOUS

## 2022-07-31 MED ORDER — TECHNETIUM TC 99M SESTAMIBI GENERIC - CARDIOLITE
10.3000 | Freq: Once | INTRAVENOUS | Status: AC | PRN
  Administered 2022-07-31: 10.3 via INTRAVENOUS

## 2022-08-04 ENCOUNTER — Encounter: Payer: Self-pay | Admitting: Cardiovascular Disease

## 2022-08-04 ENCOUNTER — Ambulatory Visit (INDEPENDENT_AMBULATORY_CARE_PROVIDER_SITE_OTHER): Payer: PPO | Admitting: Cardiovascular Disease

## 2022-08-04 VITALS — BP 114/84 | HR 49 | Ht 62.0 in | Wt 146.2 lb

## 2022-08-04 DIAGNOSIS — I10 Essential (primary) hypertension: Secondary | ICD-10-CM | POA: Diagnosis not present

## 2022-08-04 DIAGNOSIS — I25119 Atherosclerotic heart disease of native coronary artery with unspecified angina pectoris: Secondary | ICD-10-CM | POA: Diagnosis not present

## 2022-08-04 DIAGNOSIS — E782 Mixed hyperlipidemia: Secondary | ICD-10-CM

## 2022-08-04 MED ORDER — SPIRONOLACTONE 25 MG PO TABS: 25.0000 mg | ORAL_TABLET | Freq: Every day | ORAL | 5 refills | Status: DC

## 2022-08-04 NOTE — Assessment & Plan Note (Signed)
Patient stress test normal.

## 2022-08-04 NOTE — Patient Instructions (Signed)
Decrease metoprolol to 12.5 mg daily, take at bedtime Add spironolactone

## 2022-08-04 NOTE — Assessment & Plan Note (Signed)
Well controlled. EF on echo 06/2022 decreased to 48% from 69% two years ago. Will decrease metoprolol to 12.5 mg daily. Add spironolactone.

## 2022-08-04 NOTE — Progress Notes (Signed)
Cardiology Office Note   Date:  08/04/2022   ID:  Ashley, Tanner 07-03-1940, MRN [id number]  PCP:  Ashley Ivan, MD  Cardiologist:  Ashley Blackwater, MD      History of Present Illness: Ashley Tanner is a 82 y.o. female who presents for  Chief Complaint  Patient presents with   Follow-up    NST results    Patient in office to discuss results of echo and stress test. Reports occasional "pinching" around her heart, shortness of breath on exertion only.      Past Medical History:  Diagnosis Date   Arthritis    Asystole (HCC)    electrolyte imbalance   B12 deficiency    Chronic diarrhea    Diabetes mellitus without complication (HCC)    borderline   GERD (gastroesophageal reflux disease)    History of colon polyps    History of hiatal hernia    History of kidney stones    History of supraventricular tachycardia    Hoarseness of voice    Hypertension    Osteoarthritis    Other constipation    Peripheral neuropathy    Psoriasis      Past Surgical History:  Procedure Laterality Date   ABDOMINAL HYSTERECTOMY     BREAST BIOPSY Left 01/29/2003   Fibrocystic changes, ductal adenosis, microcalcifications   BREAST BIOPSY Left 03/29/2017   Affirm Bx- SUPERIOR, CENTRAL, POSTERIOR; STEREOTACTIC BIOPSY: ORGANIZING FAT NECROSIS   BREAST SURGERY Left November 2004   Fibrocystic changes, ductal adenosis, microcalcifications   COLONOSCOPY  2013   Dr Mechele Collin   COLONOSCOPY WITH PROPOFOL N/A 10/29/2017   Procedure: COLONOSCOPY WITH PROPOFOL;  Surgeon: Ashley Jun, MD;  Location: [facility] ENDOSCOPY;  Service: Endoscopy;  Laterality: N/A;   CYSTOSCOPY/URETEROSCOPY/HOLMIUM LASER/STENT PLACEMENT Right 02/25/2019   Procedure: CYSTOSCOPY/URETEROSCOPY//STENT PLACEMENT;  Surgeon: Ashley Come, MD;  Location: [facility] ORS;  Service: Urology;  Laterality: Right;   CYSTOSCOPY/URETEROSCOPY/HOLMIUM LASER/STENT PLACEMENT Right 03/17/2019   Procedure:  CYSTOSCOPY/URETEROSCOPY/HOLMIUM LASER/STENT Exchange;  Surgeon: Ashley Come, MD;  Location: [facility] ORS;  Service: Urology;  Laterality: Right;   ESOPHAGOGASTRODUODENOSCOPY     EYE SURGERY     Cataract surgery   FLEXIBLE SIGMOIDOSCOPY     HERNIA REPAIR  March 2011   [facility], intrathoracic hiatal hernia   KNEE ARTHROSCOPY Left 2014     Current Outpatient Medications  Medication Sig Dispense Refill   aspirin 81 MG EC tablet Take 81 mg by mouth daily.     calcium-vitamin D (OSCAL WITH D) 500-200 MG-UNIT per tablet Take 1 tablet by mouth daily.     docusate sodium (COLACE) 100 MG capsule Take 100 mg by mouth 2 (two) times daily.     hydrochlorothiazide (HYDRODIURIL) 12.5 MG tablet Take 12.5 mg by mouth daily.     losartan (COZAAR) 25 MG tablet Take 1 tablet by mouth daily.     metoprolol succinate (TOPROL-XL) 25 MG 24 hr tablet Take 12.5 mg by mouth daily.     nitroGLYCERIN (NITROSTAT) 0.4 MG SL tablet Place under the tongue.     ondansetron (ZOFRAN) 4 MG tablet Take 1 tablet (4 mg total) by mouth every 8 (eight) hours as needed. 20 tablet 0   polyethylene glycol powder (GLYCOLAX/MIRALAX) 17 GM/SCOOP powder Take 17 g by mouth daily as needed.     rosuvastatin (CRESTOR) 20 MG tablet Take by mouth.     spironolactone (ALDACTONE) 25 MG tablet Take 1 tablet (25 mg total) by mouth daily. 30 tablet  5   No current facility-administered medications for this visit.    Allergies:   Codeine, Lisinopril, Morphine and related, and Zoloft [sertraline hcl]    Social History:   reports that she has never smoked. She has never used smokeless tobacco. She reports that she does not drink alcohol and does not use drugs.   Family History:  family history is not on file.    ROS:     Review of Systems  Constitutional: Negative.   HENT: Negative.    Eyes: Negative.   Respiratory: Negative.    Cardiovascular: Negative.   Gastrointestinal: Negative.   Genitourinary: Negative.   Musculoskeletal:  Negative.   Skin: Negative.   Neurological: Negative.   Endo/Heme/Allergies: Negative.   Psychiatric/Behavioral: Negative.    All other systems reviewed and are negative.    All other systems are reviewed and negative.    PHYSICAL EXAM: VS:  BP 114/84   Pulse (!) 49   Ht 5\' 2"  (1.575 m)   Wt 146 lb 3.2 oz (66.3 kg)   SpO2 100%   BMI 26.74 kg/m  , BMI Body mass index is 26.74 kg/m. Last weight:  Wt Readings from Last 3 Encounters:  08/04/22 146 lb 3.2 oz (66.3 kg)  07/20/22 147 lb 3.2 oz (66.8 kg)  06/15/21 138 lb 14.4 oz (63 kg)    Physical Exam Constitutional:      Appearance: Normal appearance.  Cardiovascular:     Rate and Rhythm: Normal rate and regular rhythm.     Heart sounds: Normal heart sounds.  Pulmonary:     Effort: Pulmonary effort is normal.     Breath sounds: Normal breath sounds.  Musculoskeletal:     Right lower leg: No edema.     Left lower leg: No edema.  Neurological:     Mental Status: She is alert.     EKG: none today  Recent Labs: No results found for requested labs within last 365 days.    Lipid Panel No results found for: "CHOL", "TRIG", "HDL", "CHOLHDL", "VLDL", "LDLCALC", "LDLDIRECT"    Other studies Reviewed: Patient: [id number] - Ashley Tanner DOB:  04/20/1940    Date:  01/21/2021 07:30 Provider: Adrian Blackwater MD Encounter: NUCLEAR STRESS TEST   Page 1 TESTS    [facility] [address] [place], [place] [address] [phone] STUDY:  Gated Stress / Rest Myocardial Perfusion Imaging Tomographic (SPECT) Including attenuation correction Wall Motion, Left Ventricular Ejection Fraction By Gated Technique.Persantine Stress Test. SEX: Female  WEIGHT: 143 lbs   HEIGHT: 63 in     ARMS UP: YES/NO  REFERRING PHYSICIAN: Dr.Patrese Neal Welton Tanner                                                                                                                                                                                                                        INDICATION FOR STUDY: CP                                                                                                                                                                                                                     TECHNIQUE:  Approximately 20 minutes following the intravenous administration of 10.3 mCi of Tc-109m Sestamibi after stress testing in a reclined supine position with arms above their head if able to do so, gated SPECT imaging of the heart was performed. After about a 2hr break, the patient was injected intravenously with 31.3 mCi of Tc-6m Sestamibi.  Approximately 45 minutes later in the same position as stress imaging SPECT rest imaging of the heart was performed.  STRESS BY:  Ashley Blackwater, MD PROTOCOL:  Persantine  DOSE ADMIN:  7.3 cc  ROUTE OF ADMINISTRATION: IV  MAX PRED HR:  141                    85%: 120               75%:  106                                                                                                                  RESTING BP: 132/70   RESTING HR: 52  PEAK BP: 124/68   PEAK HR:  70                                                                   EXERCISE DURATION:    4 min injection                                            REASON FOR TEST TERMINATION:    Protocol end                                                                                                                              SYMPTOMS:   None  EKG RESULTS:  Sinus bradycardia. 56/min. Type 2, 2nd  degree AV block. No significant ST changes with persantine.                                                             IMAGE QUALITY: Good                                                                                                                                                                                                                                                                                                                                  PERFUSION/WALL MOTION FINDINGS: EF = 88%. Small mild reversible basal inferolateral and mid inferolateral wall defects, normal wall motion.                                                                           IMPRESSION:  Equivocal stress test with normal LVEF, advise CCTA.  Ashley Blackwater, MD Stress Interpreting Physician / Nuclear Interpreting Physician        Ashley Blackwater MD  Electronically signed by: Ashley Tanner     Date: 01/23/2021 11:04  Patient: [id number] - Ashley Tanner DOB:  21-Jul-1940    Date:  01/20/2021 11:30 Provider: Adrian Blackwater MD Encounter: ECHO   Page 2 REASON FOR VISIT  Visit for: Echocardiogram/Chest Pain   Sex:   Female   wt= 143   lbs.  BP= 130/70  Height= 63   inches.   TESTS  Imaging: Echocardiogram:  An echocardiogram in (2-d) mode was performed and in Doppler mode with color flow velocity mapping was performed. The aortic valve cusps are abnormal 1.2   cm, flow velocity 1.50   m/s, and systolic calculated mean flow gradient 5   mmHg. Mitral valve diastolic peak flow velocity E .528     m/s and E/A ratio 1.1. Aortic root diameter 3.0  cm. The LVOT internal diameter 1.9  cm and flow velocity was abnormal 1.06   m/s. LV systolic  dimension 2.73   cm, diastolic 4.48  cm, posterior wall thickness 2.02    cm, fractional shortening 39.1  %, and EF 69.6  %. IVS thickness 1.15   cm. LA dimension 5.5 cm. Mitral Valve has Mild Regurgitation. Aortic Valve has Trace Regurgitation. Tricuspid Valve has Mild Regurgitation.     ASSESSMENT  Technically difficult study due to body habitus.  Normal chamber sizes.  Normal left ventricular systolic function.  Mild left ventricular hypertrophy with GRADE 1 (relaxation abnormality) diastolic dysfunction.  Normal right ventricular systolic function.  Normal right ventricular diastolic function.  Normal left ventricular wall motion.  Normal right ventricular wall motion.  Mild tricuspid regurgitation.  Normal pulmonary artery pressure.  Mild mitral regurgitation.  Trace aortic regurgitation.  No pericardial effusion.  Mild LVH.   THERAPY   Referring physician: Laurier Nancy  Sonographer: Ashley Tanner.  Ashley Blackwater MD  Electronically signed by: Ashley Tanner     Date: 01/23/2021 11:21  Patient: [id number] - Ashley Tanner DOB:  April 03, 1940    Date:  02/28/2019 11:00 Provider: Adrian Blackwater MD Encounter: ALL ANGIOGRAMS (CTA BRAIN, CAROTIDS, RENAL ARTERIES, PE)   Page 2 REASON FOR VISIT  Referred by Dr.Jeneal Vogl Welton Tanner.   TESTS  Imaging: Computed Tomographic Angiography:  Cardiac multidetector CT was performed paying particular attention to the coronary arteries for the diagnosis of: History of sudden cardiac arrest. Diagnostic Drugs:  Administered iohexol (Omnipaque) through an antecubital vein and images from the examination were analyzed for the presence and extent of coronary artery disease, using 3D image processing software. 100 mL of non-ionic contrast (Omnipaque) was used.   TEST CONCLUSIONS  Quality of study: Good  1-Calcium score: 632.1  2-Right dominant system.  3-RCA appears normal. Proximal LAD has 50% disease. Proximal LCX has 60% disease. Cannot rule out  distal left main disease. Consider Cath.  Ashley Blackwater MD  Electronically signed by: Ashley Tanner     Date: 03/01/2019 07:43   ASSESSMENT AND PLAN:    ICD-10-CM   1. Mixed hyperlipidemia  E78.2     2. Primary hypertension  I10 spironolactone (ALDACTONE) 25 MG tablet    3. Coronary artery disease involving native coronary artery of native heart with angina pectoris (HCC)  I25.119        Problem List Items Addressed This Visit       Cardiovascular and Mediastinum   HTN (hypertension)    Well controlled. EF  on echo 06/2022 decreased to 48% from 69% two years ago. Will decrease metoprolol to 12.5 mg daily. Add spironolactone.       Relevant Medications   spironolactone (ALDACTONE) 25 MG tablet   Coronary artery disease involving native coronary artery of native heart with angina pectoris [facility])    Patient stress test normal.       Relevant Medications   spironolactone (ALDACTONE) 25 MG tablet     Other   Mixed hyperlipidemia - Primary   Relevant Medications   spironolactone (ALDACTONE) 25 MG tablet     Disposition:   Return in about 4 weeks (around 09/01/2022).    Total time spent: 30 minutes  Signed,  Ashley Blackwater, MD  08/04/2022 10:37 AM    [facility]

## 2022-08-15 ENCOUNTER — Other Ambulatory Visit: Payer: Self-pay | Admitting: Cardiovascular Disease

## 2022-08-15 DIAGNOSIS — R079 Chest pain, unspecified: Secondary | ICD-10-CM

## 2022-08-18 ENCOUNTER — Ambulatory Visit: Payer: PPO | Admitting: Cardiovascular Disease

## 2022-09-04 ENCOUNTER — Encounter: Payer: Self-pay | Admitting: Cardiovascular Disease

## 2022-09-04 ENCOUNTER — Ambulatory Visit (INDEPENDENT_AMBULATORY_CARE_PROVIDER_SITE_OTHER): Payer: PPO | Admitting: Cardiovascular Disease

## 2022-09-04 VITALS — BP 140/70 | HR 56 | Ht 62.0 in | Wt 140.0 lb

## 2022-09-04 DIAGNOSIS — I469 Cardiac arrest, cause unspecified: Secondary | ICD-10-CM | POA: Diagnosis not present

## 2022-09-04 DIAGNOSIS — E119 Type 2 diabetes mellitus without complications: Secondary | ICD-10-CM | POA: Diagnosis not present

## 2022-09-04 DIAGNOSIS — I10 Essential (primary) hypertension: Secondary | ICD-10-CM | POA: Diagnosis not present

## 2022-09-04 DIAGNOSIS — I700 Atherosclerosis of aorta: Secondary | ICD-10-CM

## 2022-09-04 MED ORDER — LOSARTAN POTASSIUM-HCTZ 50-12.5 MG PO TABS: 1.0000 | ORAL_TABLET | Freq: Every day | ORAL | 11 refills | Status: DC

## 2022-09-04 NOTE — Progress Notes (Signed)
Cardiology Office Note   Date:  09/04/2022   ID:  Charna, Neeb 09-28-1940, MRN [id number]  PCP:  Marisue Ivan, MD  Cardiologist:  Adrian Blackwater, MD      History of Present Illness: Ashley Tanner is a 82 y.o. female who presents for  Chief Complaint  Patient presents with   Follow-up    1 Months    Feel fine, nervous for tests.      Past Medical History:  Diagnosis Date   Arthritis    Asystole (HCC)    electrolyte imbalance   B12 deficiency    Chronic diarrhea    Diabetes mellitus without complication (HCC)    borderline   GERD (gastroesophageal reflux disease)    History of colon polyps    History of hiatal hernia    History of kidney stones    History of supraventricular tachycardia    Hoarseness of voice    Hypertension    Osteoarthritis    Other constipation    Peripheral neuropathy    Psoriasis      Past Surgical History:  Procedure Laterality Date   ABDOMINAL HYSTERECTOMY     BREAST BIOPSY Left 01/29/2003   Fibrocystic changes, ductal adenosis, microcalcifications   BREAST BIOPSY Left 03/29/2017   Affirm Bx- SUPERIOR, CENTRAL, POSTERIOR; STEREOTACTIC BIOPSY: ORGANIZING FAT NECROSIS   BREAST SURGERY Left November 2004   Fibrocystic changes, ductal adenosis, microcalcifications   COLONOSCOPY  2013   Dr Mechele Collin   COLONOSCOPY WITH PROPOFOL N/A 10/29/2017   Procedure: COLONOSCOPY WITH PROPOFOL;  Surgeon: Scot Jun, MD;  Location: [facility] ENDOSCOPY;  Service: Endoscopy;  Laterality: N/A;   CYSTOSCOPY/URETEROSCOPY/HOLMIUM LASER/STENT PLACEMENT Right 02/25/2019   Procedure: CYSTOSCOPY/URETEROSCOPY//STENT PLACEMENT;  Surgeon: Sondra Come, MD;  Location: [facility] ORS;  Service: Urology;  Laterality: Right;   CYSTOSCOPY/URETEROSCOPY/HOLMIUM LASER/STENT PLACEMENT Right 03/17/2019   Procedure: CYSTOSCOPY/URETEROSCOPY/HOLMIUM LASER/STENT Exchange;  Surgeon: Sondra Come, MD;  Location: [facility] ORS;  Service: Urology;  Laterality: Right;    ESOPHAGOGASTRODUODENOSCOPY     EYE SURGERY     Cataract surgery   FLEXIBLE SIGMOIDOSCOPY     HERNIA REPAIR  March 2011   [facility], intrathoracic hiatal hernia   KNEE ARTHROSCOPY Left 2014     Current Outpatient Medications  Medication Sig Dispense Refill   losartan-hydrochlorothiazide (HYZAAR) 50-12.5 MG tablet Take 1 tablet by mouth daily. 30 tablet 11   aspirin 81 MG EC tablet Take 81 mg by mouth daily.     calcium-vitamin D (OSCAL WITH D) 500-200 MG-UNIT per tablet Take 1 tablet by mouth daily.     docusate sodium (COLACE) 100 MG capsule Take 100 mg by mouth 2 (two) times daily.     metoprolol succinate (TOPROL-XL) 25 MG 24 hr tablet USE AS DIRECTED, TAKE 1/2 TABLET BY MOUTH DAILY 45 tablet 0   nitroGLYCERIN (NITROSTAT) 0.4 MG SL tablet Place under the tongue.     ondansetron (ZOFRAN) 4 MG tablet Take 1 tablet (4 mg total) by mouth every 8 (eight) hours as needed. 20 tablet 0   polyethylene glycol powder (GLYCOLAX/MIRALAX) 17 GM/SCOOP powder Take 17 g by mouth daily as needed.     rosuvastatin (CRESTOR) 20 MG tablet Take by mouth.     spironolactone (ALDACTONE) 25 MG tablet Take 1 tablet (25 mg total) by mouth daily. 30 tablet 5   No current facility-administered medications for this visit.    Allergies:   Codeine, Lisinopril, Morphine and codeine, and Zoloft [sertraline hcl]  Social History:   reports that she has never smoked. She has never used smokeless tobacco. She reports that she does not drink alcohol and does not use drugs.   Family History:  family history is not on file.    ROS:     Review of Systems  Constitutional: Negative.   HENT: Negative.    Eyes: Negative.   Respiratory: Negative.    Gastrointestinal: Negative.   Genitourinary: Negative.   Musculoskeletal: Negative.   Skin: Negative.   Neurological: Negative.   Endo/Heme/Allergies: Negative.   Psychiatric/Behavioral: Negative.    All other systems reviewed and are negative.     All other systems  are reviewed and negative.    PHYSICAL EXAM: VS:  BP (!) 140/70   Pulse (!) 56   Ht 5\' 2"  (1.575 m)   Wt 140 lb (63.5 kg)   SpO2 99%   BMI 25.61 kg/m  , BMI Body mass index is 25.61 kg/m. Last weight:  Wt Readings from Last 3 Encounters:  09/04/22 140 lb (63.5 kg)  08/04/22 146 lb 3.2 oz (66.3 kg)  07/20/22 147 lb 3.2 oz (66.8 kg)     Physical Exam Constitutional:      Appearance: Normal appearance.  Cardiovascular:     Rate and Rhythm: Normal rate and regular rhythm.     Heart sounds: Normal heart sounds.  Pulmonary:     Effort: Pulmonary effort is normal.     Breath sounds: Normal breath sounds.  Musculoskeletal:     Right lower leg: No edema.     Left lower leg: No edema.  Neurological:     Mental Status: She is alert.       EKG:   Recent Labs: No results found for requested labs within last 365 days.    Lipid Panel No results found for: "CHOL", "TRIG", "HDL", "CHOLHDL", "VLDL", "LDLCALC", "LDLDIRECT"    Other studies Reviewed: Additional studies/ records that were reviewed today include:  Review of the above records demonstrates:       No data to display            ASSESSMENT AND PLAN:    ICD-10-CM   1. Aortic atherosclerosis (HCC)  I70.0 losartan-hydrochlorothiazide (HYZAAR) 50-12.5 MG tablet   stress test was normal    2. Cardiac arrest (HCC)  I46.9 losartan-hydrochlorothiazide (HYZAAR) 50-12.5 MG tablet    3. Primary hypertension  I10 losartan-hydrochlorothiazide (HYZAAR) 50-12.5 MG tablet   BP is high, and ECHO showed LVEF 48%, advise getting to goal as has LV dysfunction due to hypertensive heart disease.stop losatan and hctz, and take hyzaar    4. Type 2 diabetes mellitus without complication, without long-term current use of insulin (HCC)  E11.9 losartan-hydrochlorothiazide (HYZAAR) 50-12.5 MG tablet       Problem List Items Addressed This Visit       Cardiovascular and Mediastinum   HTN (hypertension)   Relevant Medications    losartan-hydrochlorothiazide (HYZAAR) 50-12.5 MG tablet   Cardiac arrest (HCC)   Relevant Medications   losartan-hydrochlorothiazide (HYZAAR) 50-12.5 MG tablet   Aortic atherosclerosis (HCC) - Primary   Relevant Medications   losartan-hydrochlorothiazide (HYZAAR) 50-12.5 MG tablet     Endocrine   Diabetes mellitus, type 2 (HCC)   Relevant Medications   losartan-hydrochlorothiazide (HYZAAR) 50-12.5 MG tablet       Disposition:   Return in about 4 weeks (around 10/02/2022).    Total time spent: 30 minutes  Signed,  Adrian Blackwater, MD  09/04/2022 10:45 AM  [place]

## 2022-09-23 DIAGNOSIS — H35379 Puckering of macula, unspecified eye: Secondary | ICD-10-CM | POA: Diagnosis not present

## 2022-09-23 DIAGNOSIS — D3132 Benign neoplasm of left choroid: Secondary | ICD-10-CM | POA: Diagnosis not present

## 2022-09-23 DIAGNOSIS — Z961 Presence of intraocular lens: Secondary | ICD-10-CM | POA: Diagnosis not present

## 2022-09-23 DIAGNOSIS — H35341 Macular cyst, hole, or pseudohole, right eye: Secondary | ICD-10-CM | POA: Diagnosis not present

## 2022-10-08 ENCOUNTER — Ambulatory Visit: Payer: PPO | Admitting: Cardiovascular Disease

## 2022-10-08 ENCOUNTER — Encounter: Payer: Self-pay | Admitting: Cardiovascular Disease

## 2022-10-08 VITALS — BP 110/70 | HR 61 | Ht 62.0 in | Wt 143.2 lb

## 2022-10-08 DIAGNOSIS — I700 Atherosclerosis of aorta: Secondary | ICD-10-CM

## 2022-10-08 DIAGNOSIS — R0789 Other chest pain: Secondary | ICD-10-CM

## 2022-10-08 DIAGNOSIS — I10 Essential (primary) hypertension: Secondary | ICD-10-CM | POA: Diagnosis not present

## 2022-10-08 DIAGNOSIS — I25119 Atherosclerotic heart disease of native coronary artery with unspecified angina pectoris: Secondary | ICD-10-CM

## 2022-10-08 DIAGNOSIS — R079 Chest pain, unspecified: Secondary | ICD-10-CM

## 2022-10-08 MED ORDER — METOPROLOL SUCCINATE ER 25 MG PO TB24: 12.5000 mg | ORAL_TABLET | Freq: Every day | ORAL | 4 refills | Status: DC

## 2022-10-08 MED ORDER — LOSARTAN POTASSIUM 25 MG PO TABS: 25.0000 mg | ORAL_TABLET | Freq: Every day | ORAL | 11 refills | Status: DC

## 2022-10-08 NOTE — Assessment & Plan Note (Signed)
BP normal now on losartan 25, but dropped with higher dose hyzaar 50. So will go back to losatan 25.

## 2022-10-08 NOTE — Progress Notes (Signed)
Cardiology Office Note   Date:  10/08/2022   ID:  Ashley Tanner, DOB 07-27-1940, MRN [id number]  PCP:  Marisue Ivan, MD  Cardiologist:  Adrian Blackwater, MD      History of Present Illness: Ashley Tanner is a 82 y.o. female who presents for  Chief Complaint  Patient presents with   Follow-up    F/U    Bp dropped 90/60 with losartan hydrochlorothiazide   50/12.5      Past Medical History:  Diagnosis Date   Arthritis    Asystole (HCC)    electrolyte imbalance   B12 deficiency    Chronic diarrhea    Diabetes mellitus without complication (HCC)    borderline   GERD (gastroesophageal reflux disease)    History of colon polyps    History of hiatal hernia    History of kidney stones    History of supraventricular tachycardia    Hoarseness of voice    Hypertension    Osteoarthritis    Other constipation    Peripheral neuropathy    Psoriasis      Past Surgical History:  Procedure Laterality Date   ABDOMINAL HYSTERECTOMY     BREAST BIOPSY Left 01/29/2003   Fibrocystic changes, ductal adenosis, microcalcifications   BREAST BIOPSY Left 03/29/2017   Affirm Bx- SUPERIOR, CENTRAL, POSTERIOR; STEREOTACTIC BIOPSY: ORGANIZING FAT NECROSIS   BREAST SURGERY Left November 2004   Fibrocystic changes, ductal adenosis, microcalcifications   COLONOSCOPY  2013   Dr Mechele Collin   COLONOSCOPY WITH PROPOFOL N/A 10/29/2017   Procedure: COLONOSCOPY WITH PROPOFOL;  Surgeon: Scot Jun, MD;  Location: [facility] ENDOSCOPY;  Service: Endoscopy;  Laterality: N/A;   CYSTOSCOPY/URETEROSCOPY/HOLMIUM LASER/STENT PLACEMENT Right 02/25/2019   Procedure: CYSTOSCOPY/URETEROSCOPY//STENT PLACEMENT;  Surgeon: Sondra Come, MD;  Location: [facility] ORS;  Service: Urology;  Laterality: Right;   CYSTOSCOPY/URETEROSCOPY/HOLMIUM LASER/STENT PLACEMENT Right 03/17/2019   Procedure: CYSTOSCOPY/URETEROSCOPY/HOLMIUM LASER/STENT Exchange;  Surgeon: Sondra Come, MD;  Location: [facility] ORS;  Service:  Urology;  Laterality: Right;   ESOPHAGOGASTRODUODENOSCOPY     EYE SURGERY     Cataract surgery   FLEXIBLE SIGMOIDOSCOPY     HERNIA REPAIR  March 2011   [facility], intrathoracic hiatal hernia   KNEE ARTHROSCOPY Left 2014     Current Outpatient Medications  Medication Sig Dispense Refill   losartan (COZAAR) 25 MG tablet Take 1 tablet (25 mg total) by mouth daily. 30 tablet 11   aspirin 81 MG EC tablet Take 81 mg by mouth daily.     calcium-vitamin D (OSCAL WITH D) 500-200 MG-UNIT per tablet Take 1 tablet by mouth daily.     docusate sodium (COLACE) 100 MG capsule Take 100 mg by mouth 2 (two) times daily.     metoprolol succinate (TOPROL-XL) 25 MG 24 hr tablet Take 0.5 tablets (12.5 mg total) by mouth daily. 45 tablet 4   nitroGLYCERIN (NITROSTAT) 0.4 MG SL tablet Place under the tongue.     ondansetron (ZOFRAN) 4 MG tablet Take 1 tablet (4 mg total) by mouth every 8 (eight) hours as needed. 20 tablet 0   polyethylene glycol powder (GLYCOLAX/MIRALAX) 17 GM/SCOOP powder Take 17 g by mouth daily as needed.     rosuvastatin (CRESTOR) 20 MG tablet Take by mouth.     spironolactone (ALDACTONE) 25 MG tablet Take 1 tablet (25 mg total) by mouth daily. 30 tablet 5   No current facility-administered medications for this visit.    Allergies:   Codeine, Lisinopril, Morphine and codeine, and  Zoloft [sertraline hcl]    Social History:   reports that she has never smoked. She has never used smokeless tobacco. She reports that she does not drink alcohol and does not use drugs.   Family History:  family history is not on file.    ROS:     Review of Systems  Constitutional: Negative.   HENT: Negative.    Eyes: Negative.   Respiratory: Negative.    Gastrointestinal: Negative.   Genitourinary: Negative.   Musculoskeletal: Negative.   Skin: Negative.   Neurological: Negative.   Endo/Heme/Allergies: Negative.   Psychiatric/Behavioral: Negative.    All other systems reviewed and are negative.      All other systems are reviewed and negative.    PHYSICAL EXAM: VS:  BP 110/70   Pulse 61   Ht 5\' 2"  (1.575 m)   Wt 143 lb 3.2 oz (65 kg)   SpO2 98%   BMI 26.19 kg/m  , BMI Body mass index is 26.19 kg/m. Last weight:  Wt Readings from Last 3 Encounters:  10/08/22 143 lb 3.2 oz (65 kg)  09/04/22 140 lb (63.5 kg)  08/04/22 146 lb 3.2 oz (66.3 kg)     Physical Exam Constitutional:      Appearance: Normal appearance.  Cardiovascular:     Rate and Rhythm: Normal rate and regular rhythm.     Heart sounds: Normal heart sounds.  Pulmonary:     Effort: Pulmonary effort is normal.     Breath sounds: Normal breath sounds.  Musculoskeletal:     Right lower leg: No edema.     Left lower leg: No edema.  Neurological:     Mental Status: She is alert.       EKG:   Recent Labs: No results found for requested labs within last 365 days.    Lipid Panel No results found for: "CHOL", "TRIG", "HDL", "CHOLHDL", "VLDL", "LDLCALC", "LDLDIRECT"    Other studies Reviewed: Additional studies/ records that were reviewed today include:  Review of the above records demonstrates:       No data to display            ASSESSMENT AND PLAN:    ICD-10-CM   1. Other chest pain  R07.89    No further chest pain    2. Chest pain, unspecified type  R07.9 metoprolol succinate (TOPROL-XL) 25 MG 24 hr tablet    losartan (COZAAR) 25 MG tablet   no chest pain    3. Aortic atherosclerosis (HCC)  I70.0     4. Coronary artery disease involving native coronary artery of native heart with angina pectoris (HCC)  I25.119    stable    5. Primary hypertension  I10    stable       Problem List Items Addressed This Visit       Cardiovascular and Mediastinum   HTN (hypertension)    BP normal now on losartan 25, but dropped with higher dose hyzaar 50. So will go back to losatan 25.      Relevant Medications   metoprolol succinate (TOPROL-XL) 25 MG 24 hr tablet   losartan (COZAAR) 25  MG tablet   Aortic atherosclerosis (HCC)   Relevant Medications   metoprolol succinate (TOPROL-XL) 25 MG 24 hr tablet   losartan (COZAAR) 25 MG tablet   Coronary artery disease involving native coronary artery of native heart with angina pectoris (HCC)   Relevant Medications   metoprolol succinate (TOPROL-XL) 25 MG 24 hr tablet  losartan (COZAAR) 25 MG tablet     Other   Other chest pain - Primary   Other Visit Diagnoses     Chest pain, unspecified type       no chest pain   Relevant Medications   metoprolol succinate (TOPROL-XL) 25 MG 24 hr tablet   losartan (COZAAR) 25 MG tablet          Disposition:   Return in about 4 weeks (around 11/05/2022).    Total time spent: 30 minutes  Signed,  Adrian Blackwater, MD  10/08/2022 11:29 AM    [facility]

## 2022-10-09 ENCOUNTER — Other Ambulatory Visit: Payer: Self-pay | Admitting: Cardiovascular Disease

## 2022-10-09 DIAGNOSIS — I2510 Atherosclerotic heart disease of native coronary artery without angina pectoris: Secondary | ICD-10-CM

## 2022-11-10 ENCOUNTER — Ambulatory Visit: Payer: PPO | Admitting: Cardiovascular Disease

## 2022-11-10 ENCOUNTER — Encounter: Payer: Self-pay | Admitting: Cardiovascular Disease

## 2022-11-10 VITALS — BP 130/80 | HR 56 | Ht 62.0 in | Wt 140.6 lb

## 2022-11-10 DIAGNOSIS — E782 Mixed hyperlipidemia: Secondary | ICD-10-CM

## 2022-11-10 DIAGNOSIS — I10 Essential (primary) hypertension: Secondary | ICD-10-CM | POA: Diagnosis not present

## 2022-11-10 DIAGNOSIS — I700 Atherosclerosis of aorta: Secondary | ICD-10-CM | POA: Diagnosis not present

## 2022-11-10 DIAGNOSIS — I25119 Atherosclerotic heart disease of native coronary artery with unspecified angina pectoris: Secondary | ICD-10-CM

## 2022-11-10 NOTE — Progress Notes (Signed)
Cardiology Office Note   Date:  11/10/2022   ID:  Ashley, Tanner September 19, 1940, MRN [id number]  PCP:  Marisue Ivan, MD  Cardiologist:  Adrian Blackwater, MD      History of Present Illness: Ashley Tanner is a 82 y.o. female who presents for  Chief Complaint  Patient presents with   Follow-up    1 mo f/u    Home BP high      Past Medical History:  Diagnosis Date   Arthritis    Asystole (HCC)    electrolyte imbalance   B12 deficiency    Chronic diarrhea    Diabetes mellitus without complication (HCC)    borderline   GERD (gastroesophageal reflux disease)    History of colon polyps    History of hiatal hernia    History of kidney stones    History of supraventricular tachycardia    Hoarseness of voice    Hypertension    Osteoarthritis    Other constipation    Peripheral neuropathy    Psoriasis      Past Surgical History:  Procedure Laterality Date   ABDOMINAL HYSTERECTOMY     BREAST BIOPSY Left 01/29/2003   Fibrocystic changes, ductal adenosis, microcalcifications   BREAST BIOPSY Left 03/29/2017   Affirm Bx- SUPERIOR, CENTRAL, POSTERIOR; STEREOTACTIC BIOPSY: ORGANIZING FAT NECROSIS   BREAST SURGERY Left November 2004   Fibrocystic changes, ductal adenosis, microcalcifications   COLONOSCOPY  2013   Dr Mechele Collin   COLONOSCOPY WITH PROPOFOL N/A 10/29/2017   Procedure: COLONOSCOPY WITH PROPOFOL;  Surgeon: Scot Jun, MD;  Location: [facility] ENDOSCOPY;  Service: Endoscopy;  Laterality: N/A;   CYSTOSCOPY/URETEROSCOPY/HOLMIUM LASER/STENT PLACEMENT Right 02/25/2019   Procedure: CYSTOSCOPY/URETEROSCOPY//STENT PLACEMENT;  Surgeon: Sondra Come, MD;  Location: [facility] ORS;  Service: Urology;  Laterality: Right;   CYSTOSCOPY/URETEROSCOPY/HOLMIUM LASER/STENT PLACEMENT Right 03/17/2019   Procedure: CYSTOSCOPY/URETEROSCOPY/HOLMIUM LASER/STENT Exchange;  Surgeon: Sondra Come, MD;  Location: [facility] ORS;  Service: Urology;  Laterality: Right;    ESOPHAGOGASTRODUODENOSCOPY     EYE SURGERY     Cataract surgery   FLEXIBLE SIGMOIDOSCOPY     HERNIA REPAIR  March 2011   [facility], intrathoracic hiatal hernia   KNEE ARTHROSCOPY Left 2014     Current Outpatient Medications  Medication Sig Dispense Refill   aspirin 81 MG EC tablet Take 81 mg by mouth daily.     calcium-vitamin D (OSCAL WITH D) 500-200 MG-UNIT per tablet Take 1 tablet by mouth daily.     docusate sodium (COLACE) 100 MG capsule Take 100 mg by mouth 2 (two) times daily.     losartan (COZAAR) 25 MG tablet Take 1 tablet (25 mg total) by mouth daily. 30 tablet 11   metoprolol succinate (TOPROL-XL) 25 MG 24 hr tablet Take 0.5 tablets (12.5 mg total) by mouth daily. 45 tablet 4   nitroGLYCERIN (NITROSTAT) 0.4 MG SL tablet Place under the tongue.     ondansetron (ZOFRAN) 4 MG tablet Take 1 tablet (4 mg total) by mouth every 8 (eight) hours as needed. 20 tablet 0   polyethylene glycol powder (GLYCOLAX/MIRALAX) 17 GM/SCOOP powder Take 17 g by mouth daily as needed.     rosuvastatin (CRESTOR) 20 MG tablet TAKE 1 TABLET BY MOUTH EVERY DAY 90 tablet 1   spironolactone (ALDACTONE) 25 MG tablet Take 1 tablet (25 mg total) by mouth daily. 30 tablet 5   No current facility-administered medications for this visit.    Allergies:   Codeine, Lisinopril, Morphine and codeine,  and Zoloft [sertraline hcl]    Social History:   reports that she has never smoked. She has never used smokeless tobacco. She reports that she does not drink alcohol and does not use drugs.   Family History:  family history is not on file.    ROS:     Review of Systems  Constitutional: Negative.   HENT: Negative.    Eyes: Negative.   Respiratory: Negative.    Gastrointestinal: Negative.   Genitourinary: Negative.   Musculoskeletal: Negative.   Skin: Negative.   Neurological: Negative.   Endo/Heme/Allergies: Negative.   Psychiatric/Behavioral: Negative.    All other systems reviewed and are negative.      All other systems are reviewed and negative.    PHYSICAL EXAM: VS:  BP 130/80   Pulse (!) 56   Ht 5\' 2"  (1.575 m)   Wt 140 lb 9.6 oz (63.8 kg)   SpO2 97%   BMI 25.72 kg/m  , BMI Body mass index is 25.72 kg/m. Last weight:  Wt Readings from Last 3 Encounters:  11/10/22 140 lb 9.6 oz (63.8 kg)  10/08/22 143 lb 3.2 oz (65 kg)  09/04/22 140 lb (63.5 kg)     Physical Exam Constitutional:      Appearance: Normal appearance.  Cardiovascular:     Rate and Rhythm: Normal rate and regular rhythm.     Heart sounds: Normal heart sounds.  Pulmonary:     Effort: Pulmonary effort is normal.     Breath sounds: Normal breath sounds.  Musculoskeletal:     Right lower leg: No edema.     Left lower leg: No edema.  Neurological:     Mental Status: She is alert.       EKG:   Recent Labs: No results found for requested labs within last 365 days.    Lipid Panel No results found for: "CHOL", "TRIG", "HDL", "CHOLHDL", "VLDL", "LDLCALC", "LDLDIRECT"    Other studies Reviewed: Additional studies/ records that were reviewed today include:  Review of the above records demonstrates:       No data to display            ASSESSMENT AND PLAN:    ICD-10-CM   1. Aortic atherosclerosis (HCC)  I70.0     2. Coronary artery disease involving native coronary artery of native heart with angina pectoris (HCC)  I25.119     3. Primary hypertension  I10    stable on recheck    4. Mixed hyperlipidemia  E78.2        Problem List Items Addressed This Visit       Cardiovascular and Mediastinum   HTN (hypertension)   Aortic atherosclerosis (HCC) - Primary   Coronary artery disease involving native coronary artery of native heart with angina pectoris (HCC)     Other   Mixed hyperlipidemia       Disposition:   Return in about 3 months (around 02/10/2023).    Total time spent: 30 minutes  Signed,  Adrian Blackwater, MD  11/10/2022 10:56 AM    [facility]

## 2022-11-17 DIAGNOSIS — I700 Atherosclerosis of aorta: Secondary | ICD-10-CM | POA: Diagnosis not present

## 2022-11-17 DIAGNOSIS — I10 Essential (primary) hypertension: Secondary | ICD-10-CM | POA: Diagnosis not present

## 2022-11-17 DIAGNOSIS — R7303 Prediabetes: Secondary | ICD-10-CM | POA: Diagnosis not present

## 2022-11-24 ENCOUNTER — Other Ambulatory Visit: Payer: Self-pay | Admitting: Family Medicine

## 2022-11-24 ENCOUNTER — Ambulatory Visit
Admission: RE | Admit: 2022-11-24 | Discharge: 2022-11-24 | Disposition: A | Discharge status: Home / Self Care | Payer: PPO | Source: Ambulatory Visit | Attending: Family Medicine | Admitting: Family Medicine

## 2022-11-24 DIAGNOSIS — R600 Localized edema: Secondary | ICD-10-CM | POA: Diagnosis not present

## 2022-11-24 DIAGNOSIS — R7303 Prediabetes: Secondary | ICD-10-CM | POA: Diagnosis not present

## 2022-11-24 DIAGNOSIS — I25119 Atherosclerotic heart disease of native coronary artery with unspecified angina pectoris: Secondary | ICD-10-CM | POA: Diagnosis not present

## 2022-11-24 DIAGNOSIS — M79604 Pain in right leg: Secondary | ICD-10-CM | POA: Insufficient documentation

## 2022-11-24 DIAGNOSIS — R079 Chest pain, unspecified: Secondary | ICD-10-CM | POA: Diagnosis not present

## 2022-11-24 DIAGNOSIS — R55 Syncope and collapse: Secondary | ICD-10-CM | POA: Diagnosis not present

## 2022-11-24 DIAGNOSIS — I10 Essential (primary) hypertension: Secondary | ICD-10-CM | POA: Diagnosis not present

## 2022-11-24 DIAGNOSIS — I700 Atherosclerosis of aorta: Secondary | ICD-10-CM | POA: Diagnosis not present

## 2022-12-05 ENCOUNTER — Other Ambulatory Visit: Payer: Self-pay | Admitting: Cardiovascular Disease

## 2022-12-05 DIAGNOSIS — I10 Essential (primary) hypertension: Secondary | ICD-10-CM

## 2022-12-09 DIAGNOSIS — M542 Cervicalgia: Secondary | ICD-10-CM | POA: Diagnosis not present

## 2022-12-09 DIAGNOSIS — M1711 Unilateral primary osteoarthritis, right knee: Secondary | ICD-10-CM | POA: Diagnosis not present

## 2022-12-09 DIAGNOSIS — S8001XA Contusion of right knee, initial encounter: Secondary | ICD-10-CM | POA: Diagnosis not present

## 2022-12-09 DIAGNOSIS — S8011XA Contusion of right lower leg, initial encounter: Secondary | ICD-10-CM | POA: Diagnosis not present

## 2022-12-30 DIAGNOSIS — M1711 Unilateral primary osteoarthritis, right knee: Secondary | ICD-10-CM | POA: Diagnosis not present

## 2022-12-30 DIAGNOSIS — S8011XA Contusion of right lower leg, initial encounter: Secondary | ICD-10-CM | POA: Diagnosis not present

## 2022-12-30 DIAGNOSIS — S8001XA Contusion of right knee, initial encounter: Secondary | ICD-10-CM | POA: Diagnosis not present

## 2023-01-13 DIAGNOSIS — M1711 Unilateral primary osteoarthritis, right knee: Secondary | ICD-10-CM | POA: Diagnosis not present

## 2023-01-20 DIAGNOSIS — M1711 Unilateral primary osteoarthritis, right knee: Secondary | ICD-10-CM | POA: Diagnosis not present

## 2023-01-27 DIAGNOSIS — M1711 Unilateral primary osteoarthritis, right knee: Secondary | ICD-10-CM | POA: Diagnosis not present

## 2023-02-09 ENCOUNTER — Ambulatory Visit: Payer: PPO | Admitting: Cardiovascular Disease

## 2023-02-09 ENCOUNTER — Encounter: Payer: Self-pay | Admitting: Cardiovascular Disease

## 2023-02-09 VITALS — BP 116/68 | HR 51 | Ht 63.5 in | Wt 138.0 lb

## 2023-02-09 DIAGNOSIS — E782 Mixed hyperlipidemia: Secondary | ICD-10-CM

## 2023-02-09 DIAGNOSIS — I25119 Atherosclerotic heart disease of native coronary artery with unspecified angina pectoris: Secondary | ICD-10-CM

## 2023-02-09 DIAGNOSIS — I10 Essential (primary) hypertension: Secondary | ICD-10-CM

## 2023-02-09 DIAGNOSIS — I700 Atherosclerosis of aorta: Secondary | ICD-10-CM | POA: Diagnosis not present

## 2023-02-09 NOTE — Progress Notes (Signed)
Cardiology Office Note   Date:  02/09/2023   ID:  Wendell, Rodezno [birth year], MRN [id number]  PCP:  Marisue Ivan, MD  Cardiologist:  Adrian Blackwater, MD      History of Present Illness: Ashley Tanner is a 82 y.o. female who presents for  Chief Complaint  Patient presents with   Follow-up    3 month follow up    Doing well      Past Medical History:  Diagnosis Date   Arthritis    Asystole (HCC)    electrolyte imbalance   B12 deficiency    Chronic diarrhea    Diabetes mellitus without complication (HCC)    borderline   GERD (gastroesophageal reflux disease)    History of colon polyps    History of hiatal hernia    History of kidney stones    History of supraventricular tachycardia    Hoarseness of voice    Hypertension    Osteoarthritis    Other constipation    Peripheral neuropathy    Psoriasis      Past Surgical History:  Procedure Laterality Date   ABDOMINAL HYSTERECTOMY     BREAST BIOPSY Left 01/29/2003   Fibrocystic changes, ductal adenosis, microcalcifications   BREAST BIOPSY Left 03/29/2017   Affirm Bx- SUPERIOR, CENTRAL, POSTERIOR; STEREOTACTIC BIOPSY: ORGANIZING FAT NECROSIS   BREAST SURGERY Left November 2004   Fibrocystic changes, ductal adenosis, microcalcifications   COLONOSCOPY  2013   Dr Mechele Collin   COLONOSCOPY WITH PROPOFOL N/A 10/29/2017   Procedure: COLONOSCOPY WITH PROPOFOL;  Surgeon: Scot Jun, MD;  Location: [facility] ENDOSCOPY;  Service: Endoscopy;  Laterality: N/A;   CYSTOSCOPY/URETEROSCOPY/HOLMIUM LASER/STENT PLACEMENT Right 02/25/2019   Procedure: CYSTOSCOPY/URETEROSCOPY//STENT PLACEMENT;  Surgeon: Sondra Come, MD;  Location: [facility] ORS;  Service: Urology;  Laterality: Right;   CYSTOSCOPY/URETEROSCOPY/HOLMIUM LASER/STENT PLACEMENT Right 03/17/2019   Procedure: CYSTOSCOPY/URETEROSCOPY/HOLMIUM LASER/STENT Exchange;  Surgeon: Sondra Come, MD;  Location: [facility] ORS;  Service: Urology;  Laterality: Right;    ESOPHAGOGASTRODUODENOSCOPY     EYE SURGERY     Cataract surgery   FLEXIBLE SIGMOIDOSCOPY     HERNIA REPAIR  March 2011   [facility], intrathoracic hiatal hernia   KNEE ARTHROSCOPY Left 2014     Current Outpatient Medications  Medication Sig Dispense Refill   aspirin 81 MG EC tablet Take 81 mg by mouth daily.     calcium-vitamin D (OSCAL WITH D) 500-200 MG-UNIT per tablet Take 1 tablet by mouth daily.     docusate sodium (COLACE) 100 MG capsule Take 100 mg by mouth 2 (two) times daily.     losartan (COZAAR) 25 MG tablet Take 1 tablet (25 mg total) by mouth daily. 30 tablet 11   metoprolol succinate (TOPROL-XL) 25 MG 24 hr tablet Take 0.5 tablets (12.5 mg total) by mouth daily. 45 tablet 4   ondansetron (ZOFRAN) 4 MG tablet Take 1 tablet (4 mg total) by mouth every 8 (eight) hours as needed. 20 tablet 0   polyethylene glycol powder (GLYCOLAX/MIRALAX) 17 GM/SCOOP powder Take 17 g by mouth daily as needed.     rosuvastatin (CRESTOR) 20 MG tablet TAKE 1 TABLET BY MOUTH EVERY DAY 90 tablet 1   spironolactone (ALDACTONE) 25 MG tablet TAKE 1 TABLET (25 MG TOTAL) BY MOUTH DAILY. 90 tablet 1   nitroGLYCERIN (NITROSTAT) 0.4 MG SL tablet Place under the tongue. (Patient not taking: Reported on 02/09/2023)     No current facility-administered medications for this visit.    Allergies:  Codeine, Lisinopril, Morphine and codeine, and Zoloft [sertraline hcl]    Social History:   reports that she has never smoked. She has never used smokeless tobacco. She reports that she does not drink alcohol and does not use drugs.   Family History:  family history is not on file.    ROS:     Review of Systems  Constitutional: Negative.   HENT: Negative.    Eyes: Negative.   Respiratory: Negative.    Gastrointestinal: Negative.   Genitourinary: Negative.   Musculoskeletal: Negative.   Skin: Negative.   Neurological: Negative.   Endo/Heme/Allergies: Negative.   Psychiatric/Behavioral: Negative.    All other  systems reviewed and are negative.     All other systems are reviewed and negative.    PHYSICAL EXAM: VS:  BP 116/68   Pulse (!) 51   Ht 5' 3.5" (1.613 m)   Wt 138 lb (62.6 kg)   SpO2 97%   BMI 24.06 kg/m  , BMI Body mass index is 24.06 kg/m. Last weight:  Wt Readings from Last 3 Encounters:  02/09/23 138 lb (62.6 kg)  11/10/22 140 lb 9.6 oz (63.8 kg)  10/08/22 143 lb 3.2 oz (65 kg)     Physical Exam Constitutional:      Appearance: Normal appearance.  Cardiovascular:     Rate and Rhythm: Normal rate and regular rhythm.     Heart sounds: Normal heart sounds.  Pulmonary:     Effort: Pulmonary effort is normal.     Breath sounds: Normal breath sounds.  Musculoskeletal:     Right lower leg: No edema.     Left lower leg: No edema.  Neurological:     Mental Status: She is alert.       EKG:   Recent Labs: No results found for requested labs within last 365 days.    Lipid Panel No results found for: "CHOL", "TRIG", "HDL", "CHOLHDL", "VLDL", "LDLCALC", "LDLDIRECT"    Other studies Reviewed: Additional studies/ records that were reviewed today include:  Review of the above records demonstrates:       No data to display            ASSESSMENT AND PLAN:    ICD-10-CM   1. Primary hypertension  I10    stable    2. Coronary artery disease involving native coronary artery of native heart with angina pectoris (HCC)  I25.119    no chest pains    3. Aortic atherosclerosis (HCC)  I70.0     4. Mixed hyperlipidemia  E78.2        Problem List Items Addressed This Visit       Cardiovascular and Mediastinum   HTN (hypertension) - Primary   Aortic atherosclerosis (HCC)   Coronary artery disease involving native coronary artery of native heart with angina pectoris (HCC)     Other   Mixed hyperlipidemia       Disposition:   Return in about 3 months (around 05/12/2023).    Total time spent: 30 minutes  Signed,  Adrian Blackwater, MD  02/09/2023 10:22  AM    [facility]

## 2023-02-15 DIAGNOSIS — L400 Psoriasis vulgaris: Secondary | ICD-10-CM | POA: Diagnosis not present

## 2023-05-11 ENCOUNTER — Ambulatory Visit: Payer: PPO | Admitting: Cardiovascular Disease

## 2023-05-17 ENCOUNTER — Encounter: Payer: Self-pay | Admitting: Cardiovascular Disease

## 2023-05-17 ENCOUNTER — Ambulatory Visit: Payer: PPO | Admitting: Cardiovascular Disease

## 2023-05-17 VITALS — BP 114/71 | HR 48 | Ht 63.0 in | Wt 130.0 lb

## 2023-05-17 DIAGNOSIS — I340 Nonrheumatic mitral (valve) insufficiency: Secondary | ICD-10-CM | POA: Diagnosis not present

## 2023-05-17 DIAGNOSIS — I25119 Atherosclerotic heart disease of native coronary artery with unspecified angina pectoris: Secondary | ICD-10-CM

## 2023-05-17 DIAGNOSIS — E782 Mixed hyperlipidemia: Secondary | ICD-10-CM

## 2023-05-17 DIAGNOSIS — I700 Atherosclerosis of aorta: Secondary | ICD-10-CM

## 2023-05-17 DIAGNOSIS — I351 Nonrheumatic aortic (valve) insufficiency: Secondary | ICD-10-CM

## 2023-05-17 DIAGNOSIS — I10 Essential (primary) hypertension: Secondary | ICD-10-CM | POA: Diagnosis not present

## 2023-05-17 NOTE — Progress Notes (Signed)
 Cardiology Office Note   Date:  05/17/2023   ID:  Ashley, Tanner 1940/05/07, MRN [id number]  PCP:  Ashley Ivan, MD  Cardiologist:  Ashley Blackwater, MD      History of Present Illness: Ashley Tanner is a 83 y.o. female who presents for  Chief Complaint  Patient presents with   Follow-up    3 month follow up   LOW HEART RATE- question about metoprolol     Had dizziness associated 186/88 BP few weeks ago      Past Medical History:  Diagnosis Date   Arthritis    Asystole (HCC)    electrolyte imbalance   B12 deficiency    Chronic diarrhea    Diabetes mellitus without complication (HCC)    borderline   GERD (gastroesophageal reflux disease)    History of colon polyps    History of hiatal hernia    History of kidney stones    History of supraventricular tachycardia    Hoarseness of voice    Hypertension    Osteoarthritis    Other constipation    Peripheral neuropathy    Psoriasis      Past Surgical History:  Procedure Laterality Date   ABDOMINAL HYSTERECTOMY     BREAST BIOPSY Left 01/29/2003   Fibrocystic changes, ductal adenosis, microcalcifications   BREAST BIOPSY Left 03/29/2017   Affirm Bx- SUPERIOR, CENTRAL, POSTERIOR; STEREOTACTIC BIOPSY: ORGANIZING FAT NECROSIS   BREAST SURGERY Left November 2004   Fibrocystic changes, ductal adenosis, microcalcifications   COLONOSCOPY  2013   Dr Ashley Tanner   COLONOSCOPY WITH PROPOFOL N/A 10/29/2017   Procedure: COLONOSCOPY WITH PROPOFOL;  Surgeon: Ashley Jun, MD;  Location: [facility] ENDOSCOPY;  Service: Endoscopy;  Laterality: N/A;   CYSTOSCOPY/URETEROSCOPY/HOLMIUM LASER/STENT PLACEMENT Right 02/25/2019   Procedure: CYSTOSCOPY/URETEROSCOPY//STENT PLACEMENT;  Surgeon: Ashley Come, MD;  Location: [facility] ORS;  Service: Urology;  Laterality: Right;   CYSTOSCOPY/URETEROSCOPY/HOLMIUM LASER/STENT PLACEMENT Right 03/17/2019   Procedure: CYSTOSCOPY/URETEROSCOPY/HOLMIUM LASER/STENT Exchange;  Surgeon: Ashley Come, MD;  Location: [facility] ORS;  Service: Urology;  Laterality: Right;   ESOPHAGOGASTRODUODENOSCOPY     EYE SURGERY     Cataract surgery   FLEXIBLE SIGMOIDOSCOPY     HERNIA REPAIR  March 2011   [facility], intrathoracic hiatal hernia   KNEE ARTHROSCOPY Left 2014     Current Outpatient Medications  Medication Sig Dispense Refill   aspirin 81 MG EC tablet Take 81 mg by mouth daily.     calcium-vitamin D (OSCAL WITH D) 500-200 MG-UNIT per tablet Take 1 tablet by mouth daily.     docusate sodium (COLACE) 100 MG capsule Take 100 mg by mouth 2 (two) times daily.     losartan (COZAAR) 25 MG tablet Take 1 tablet (25 mg total) by mouth daily. 30 tablet 11   metoprolol succinate (TOPROL-XL) 25 MG 24 hr tablet Take 0.5 tablets (12.5 mg total) by mouth daily. 45 tablet 4   nitroGLYCERIN (NITROSTAT) 0.4 MG SL tablet Place under the tongue. (Patient not taking: Reported on 02/09/2023)     ondansetron (ZOFRAN) 4 MG tablet Take 1 tablet (4 mg total) by mouth every 8 (eight) hours as needed. 20 tablet 0   polyethylene glycol powder (GLYCOLAX/MIRALAX) 17 GM/SCOOP powder Take 17 g by mouth daily as needed.     rosuvastatin (CRESTOR) 20 MG tablet TAKE 1 TABLET BY MOUTH EVERY DAY 90 tablet 1   spironolactone (ALDACTONE) 25 MG tablet TAKE 1 TABLET (25 MG TOTAL) BY MOUTH DAILY. 90 tablet  1   No current facility-administered medications for this visit.    Allergies:   Codeine, Lisinopril, Morphine and codeine, and Zoloft [sertraline hcl]    Social History:   reports that she has never smoked. She has never used smokeless tobacco. She reports that she does not drink alcohol and does not use drugs.   Family History:  family history is not on file.    ROS:     Review of Systems  Constitutional: Negative.   HENT: Negative.    Eyes: Negative.   Respiratory: Negative.    Gastrointestinal: Negative.   Genitourinary: Negative.   Musculoskeletal: Negative.   Skin: Negative.   Neurological: Negative.    Endo/Heme/Allergies: Negative.   Psychiatric/Behavioral: Negative.    All other systems reviewed and are negative.     All other systems are reviewed and negative.    PHYSICAL EXAM: VS:  BP 114/71   Pulse (!) 48   Ht 5\' 3"  (1.6 m)   Wt 130 lb (59 kg)   SpO2 98%   BMI 23.03 kg/m  , BMI Body mass index is 23.03 kg/m. Last weight:  Wt Readings from Last 3 Encounters:  05/17/23 130 lb (59 kg)  02/09/23 138 lb (62.6 kg)  11/10/22 140 lb 9.6 oz (63.8 kg)     Physical Exam Constitutional:      Appearance: Normal appearance.  Cardiovascular:     Rate and Rhythm: Normal rate and regular rhythm.     Heart sounds: Normal heart sounds.  Pulmonary:     Effort: Pulmonary effort is normal.     Breath sounds: Normal breath sounds.  Musculoskeletal:     Right lower leg: No edema.     Left lower leg: No edema.  Neurological:     Mental Status: She is alert.       EKG:   Recent Labs: No results found for requested labs within last 365 days.    Lipid Panel No results found for: "CHOL", "TRIG", "HDL", "CHOLHDL", "VLDL", "LDLCALC", "LDLDIRECT"    Other studies Reviewed: Additional studies/ records that were reviewed today include:  Review of the above records demonstrates:       No data to display            ASSESSMENT AND PLAN:    ICD-10-CM   1. Primary hypertension  I10    BP normal and heart rate 60 not in 40,s    2. Aortic atherosclerosis (HCC)  I70.0     3. Coronary artery disease involving native coronary artery of native heart with angina pectoris (HCC)  I25.119    stable    4. Mixed hyperlipidemia  E78.2     5. Nonrheumatic aortic valve insufficiency  I35.1     6. Nonrheumatic mitral valve regurgitation  I34.0     7. Nonrheumatic aortic (valve) insufficiency  I35.1        Problem List Items Addressed This Visit       Cardiovascular and Mediastinum   HTN (hypertension) - Primary   Aortic atherosclerosis (HCC)   Coronary artery disease  involving native coronary artery of native heart with angina pectoris (HCC)     Other   Mixed hyperlipidemia   Other Visit Diagnoses       Nonrheumatic aortic valve insufficiency         Nonrheumatic mitral valve regurgitation         Nonrheumatic aortic (valve) insufficiency  Disposition:   Return in about 3 months (around 08/14/2023).    Total time spent: 30 minutes  Signed,  Ashley Blackwater, MD  05/17/2023 11:47 AM    [facility]

## 2023-05-18 ENCOUNTER — Ambulatory Visit: Payer: PPO | Admitting: Cardiovascular Disease

## 2023-05-21 DIAGNOSIS — I10 Essential (primary) hypertension: Secondary | ICD-10-CM | POA: Diagnosis not present

## 2023-05-21 DIAGNOSIS — R7303 Prediabetes: Secondary | ICD-10-CM | POA: Diagnosis not present

## 2023-05-21 DIAGNOSIS — I700 Atherosclerosis of aorta: Secondary | ICD-10-CM | POA: Diagnosis not present

## 2023-05-28 DIAGNOSIS — R7303 Prediabetes: Secondary | ICD-10-CM | POA: Diagnosis not present

## 2023-05-28 DIAGNOSIS — Z0000 Encounter for general adult medical examination without abnormal findings: Secondary | ICD-10-CM | POA: Diagnosis not present

## 2023-05-28 DIAGNOSIS — I700 Atherosclerosis of aorta: Secondary | ICD-10-CM | POA: Diagnosis not present

## 2023-05-28 DIAGNOSIS — I10 Essential (primary) hypertension: Secondary | ICD-10-CM | POA: Diagnosis not present

## 2023-06-28 ENCOUNTER — Other Ambulatory Visit: Payer: Self-pay | Admitting: Cardiovascular Disease

## 2023-06-28 DIAGNOSIS — I2510 Atherosclerotic heart disease of native coronary artery without angina pectoris: Secondary | ICD-10-CM

## 2023-08-17 ENCOUNTER — Ambulatory Visit: Payer: PPO | Admitting: Cardiovascular Disease

## 2023-08-17 ENCOUNTER — Encounter: Payer: Self-pay | Admitting: Cardiovascular Disease

## 2023-08-17 VITALS — BP 128/76 | HR 50 | Ht 63.0 in | Wt 129.0 lb

## 2023-08-17 DIAGNOSIS — E782 Mixed hyperlipidemia: Secondary | ICD-10-CM

## 2023-08-17 DIAGNOSIS — I700 Atherosclerosis of aorta: Secondary | ICD-10-CM | POA: Diagnosis not present

## 2023-08-17 DIAGNOSIS — I340 Nonrheumatic mitral (valve) insufficiency: Secondary | ICD-10-CM

## 2023-08-17 DIAGNOSIS — I25119 Atherosclerotic heart disease of native coronary artery with unspecified angina pectoris: Secondary | ICD-10-CM

## 2023-08-17 DIAGNOSIS — I10 Essential (primary) hypertension: Secondary | ICD-10-CM

## 2023-08-17 DIAGNOSIS — I351 Nonrheumatic aortic (valve) insufficiency: Secondary | ICD-10-CM | POA: Diagnosis not present

## 2023-08-17 NOTE — Progress Notes (Signed)
 Cardiology Office Note   Date:  08/17/2023   ID:  Ashley Tanner 08/31/1940, MRN [id number]  PCP:  Monique Ano, MD  Cardiologist:  Debborah Fairly, MD      History of Present Illness: Ashley Tanner is a 83 y.o. female who presents for  Chief Complaint  Patient presents with   Follow-up    3 months follow up    Feeling fine      Past Medical History:  Diagnosis Date   Arthritis    Asystole (HCC)    electrolyte imbalance   B12 deficiency    Chronic diarrhea    Diabetes mellitus without complication (HCC)    borderline   GERD (gastroesophageal reflux disease)    History of colon polyps    History of hiatal hernia    History of kidney stones    History of supraventricular tachycardia    Hoarseness of voice    Hypertension    Osteoarthritis    Other constipation    Peripheral neuropathy    Psoriasis      Past Surgical History:  Procedure Laterality Date   ABDOMINAL HYSTERECTOMY     BREAST BIOPSY Left 01/29/2003   Fibrocystic changes, ductal adenosis, microcalcifications   BREAST BIOPSY Left 03/29/2017   Affirm Bx- SUPERIOR, CENTRAL, POSTERIOR; STEREOTACTIC BIOPSY: ORGANIZING FAT NECROSIS   BREAST SURGERY Left November 2004   Fibrocystic changes, ductal adenosis, microcalcifications   COLONOSCOPY  2013   Dr Felicita Horns   COLONOSCOPY WITH PROPOFOL  N/A 10/29/2017   Procedure: COLONOSCOPY WITH PROPOFOL ;  Surgeon: Cassie Click, MD;  Location: [facility] ENDOSCOPY;  Service: Endoscopy;  Laterality: N/A;   CYSTOSCOPY/URETEROSCOPY/HOLMIUM LASER/STENT PLACEMENT Right 02/25/2019   Procedure: CYSTOSCOPY/URETEROSCOPY//STENT PLACEMENT;  Surgeon: Lawerence Pressman, MD;  Location: [facility] ORS;  Service: Urology;  Laterality: Right;   CYSTOSCOPY/URETEROSCOPY/HOLMIUM LASER/STENT PLACEMENT Right 03/17/2019   Procedure: CYSTOSCOPY/URETEROSCOPY/HOLMIUM LASER/STENT Exchange;  Surgeon: Lawerence Pressman, MD;  Location: [facility] ORS;  Service: Urology;  Laterality: Right;    ESOPHAGOGASTRODUODENOSCOPY     EYE SURGERY     Cataract surgery   FLEXIBLE SIGMOIDOSCOPY     HERNIA REPAIR  March 2011   [facility], intrathoracic hiatal hernia   KNEE ARTHROSCOPY Left 2014     Current Outpatient Medications  Medication Sig Dispense Refill   aspirin 81 MG EC tablet Take 81 mg by mouth daily.     calcium-vitamin D (OSCAL WITH D) 500-200 MG-UNIT per tablet Take 1 tablet by mouth daily.     docusate sodium  (COLACE) 100 MG capsule Take 100 mg by mouth 2 (two) times daily.     losartan  (COZAAR ) 25 MG tablet Take 1 tablet (25 mg total) by mouth daily. 30 tablet 11   metoprolol  succinate (TOPROL -XL) 25 MG 24 hr tablet Take 0.5 tablets (12.5 mg total) by mouth daily. 45 tablet 4   nitroGLYCERIN (NITROSTAT) 0.4 MG SL tablet Place under the tongue. (Patient not taking: Reported on 02/09/2023)     ondansetron  (ZOFRAN ) 4 MG tablet Take 1 tablet (4 mg total) by mouth every 8 (eight) hours as needed. 20 tablet 0   polyethylene glycol powder (GLYCOLAX /MIRALAX ) 17 GM/SCOOP powder Take 17 g by mouth daily as needed.     rosuvastatin (CRESTOR) 20 MG tablet TAKE 1 TABLET BY MOUTH EVERY DAY 90 tablet 1   spironolactone  (ALDACTONE ) 25 MG tablet TAKE 1 TABLET (25 MG TOTAL) BY MOUTH DAILY. 90 tablet 1   No current facility-administered medications for this visit.    Allergies:  Codeine, Lisinopril, Morphine  and codeine, and Zoloft [sertraline hcl]    Social History:   reports that she has never smoked. She has never used smokeless tobacco. She reports that she does not drink alcohol and does not use drugs.   Family History:  family history is not on file.    ROS:     Review of Systems  Constitutional: Negative.   HENT: Negative.    Eyes: Negative.   Respiratory: Negative.    Gastrointestinal: Negative.   Genitourinary: Negative.   Musculoskeletal: Negative.   Skin: Negative.   Neurological: Negative.   Endo/Heme/Allergies: Negative.   Psychiatric/Behavioral: Negative.    All other  systems reviewed and are negative.     All other systems are reviewed and negative.    PHYSICAL EXAM: VS:  BP 128/76   Pulse (!) 50   Ht 5\' 3"  (1.6 m)   Wt 129 lb (58.5 kg)   SpO2 98%   BMI 22.85 kg/m  , BMI Body mass index is 22.85 kg/m. Last weight:  Wt Readings from Last 3 Encounters:  08/17/23 129 lb (58.5 kg)  05/17/23 130 lb (59 kg)  02/09/23 138 lb (62.6 kg)     Physical Exam Constitutional:      Appearance: Normal appearance.  Cardiovascular:     Rate and Rhythm: Normal rate and regular rhythm.     Heart sounds: Normal heart sounds.  Pulmonary:     Effort: Pulmonary effort is normal.     Breath sounds: Normal breath sounds.  Musculoskeletal:     Right lower leg: No edema.     Left lower leg: No edema.  Neurological:     Mental Status: She is alert.       EKG:   Recent Labs: No results found for requested labs within last 365 days.    Lipid Panel No results found for: "CHOL", "TRIG", "HDL", "CHOLHDL", "VLDL", "LDLCALC", "LDLDIRECT"    Other studies Reviewed: Additional studies/ records that were reviewed today include:  Review of the above records demonstrates:       No data to display            ASSESSMENT AND PLAN:    ICD-10-CM   1. Primary hypertension  I10    well controlled    2. Aortic atherosclerosis (HCC)  I70.0     3. Coronary artery disease involving native coronary artery of native heart with angina pectoris (HCC)  I25.119     4. Mixed hyperlipidemia  E78.2     5. Nonrheumatic aortic valve insufficiency  I35.1     6. Nonrheumatic mitral valve regurgitation  I34.0        Problem List Items Addressed This Visit       Cardiovascular and Mediastinum   HTN (hypertension) - Primary   Aortic atherosclerosis (HCC)   Coronary artery disease involving native coronary artery of native heart with angina pectoris (HCC)     Other   Mixed hyperlipidemia   Other Visit Diagnoses       Nonrheumatic aortic valve  insufficiency         Nonrheumatic mitral valve regurgitation              Disposition:   Return in about 4 months (around 12/18/2023).    Total time spent: 30 minutes / Signed,  Debborah Fairly, MD  08/17/2023 11:10 AM    [facility]

## 2023-09-27 DIAGNOSIS — D3132 Benign neoplasm of left choroid: Secondary | ICD-10-CM | POA: Diagnosis not present

## 2023-09-27 DIAGNOSIS — H35341 Macular cyst, hole, or pseudohole, right eye: Secondary | ICD-10-CM | POA: Diagnosis not present

## 2023-09-27 DIAGNOSIS — Z961 Presence of intraocular lens: Secondary | ICD-10-CM | POA: Diagnosis not present

## 2023-11-18 DIAGNOSIS — R7303 Prediabetes: Secondary | ICD-10-CM | POA: Diagnosis not present

## 2023-11-18 DIAGNOSIS — I700 Atherosclerosis of aorta: Secondary | ICD-10-CM | POA: Diagnosis not present

## 2023-11-25 DIAGNOSIS — R7303 Prediabetes: Secondary | ICD-10-CM | POA: Diagnosis not present

## 2023-11-25 DIAGNOSIS — H8110 Benign paroxysmal vertigo, unspecified ear: Secondary | ICD-10-CM | POA: Diagnosis not present

## 2023-11-25 DIAGNOSIS — I10 Essential (primary) hypertension: Secondary | ICD-10-CM | POA: Diagnosis not present

## 2023-11-25 DIAGNOSIS — I700 Atherosclerosis of aorta: Secondary | ICD-10-CM | POA: Diagnosis not present

## 2023-11-25 DIAGNOSIS — F418 Other specified anxiety disorders: Secondary | ICD-10-CM | POA: Diagnosis not present

## 2023-11-26 ENCOUNTER — Other Ambulatory Visit: Payer: Self-pay | Admitting: Cardiovascular Disease

## 2023-11-26 DIAGNOSIS — R079 Chest pain, unspecified: Secondary | ICD-10-CM

## 2023-12-21 ENCOUNTER — Encounter: Payer: Self-pay | Admitting: Cardiovascular Disease

## 2023-12-21 ENCOUNTER — Ambulatory Visit: Admitting: Cardiovascular Disease

## 2023-12-21 VITALS — BP 108/73 | HR 54 | Ht 63.0 in | Wt 137.2 lb

## 2023-12-21 DIAGNOSIS — I25119 Atherosclerotic heart disease of native coronary artery with unspecified angina pectoris: Secondary | ICD-10-CM

## 2023-12-21 DIAGNOSIS — I340 Nonrheumatic mitral (valve) insufficiency: Secondary | ICD-10-CM

## 2023-12-21 DIAGNOSIS — I700 Atherosclerosis of aorta: Secondary | ICD-10-CM

## 2023-12-21 DIAGNOSIS — E782 Mixed hyperlipidemia: Secondary | ICD-10-CM

## 2023-12-21 DIAGNOSIS — I10 Essential (primary) hypertension: Secondary | ICD-10-CM

## 2023-12-21 DIAGNOSIS — I351 Nonrheumatic aortic (valve) insufficiency: Secondary | ICD-10-CM | POA: Diagnosis not present

## 2023-12-21 NOTE — Progress Notes (Signed)
 Cardiology Office Note   Date:  12/21/2023   ID:  Storm, Dulski [birth year], MRN [id number]  PCP:  Alla Amis, MD  Cardiologist:  Denyse Bathe, MD      History of Present Illness: Ashley Tanner is a 83 y.o. female who presents for  Chief Complaint  Patient presents with   Follow-up    4 month follow up    Doing well.      Past Medical History:  Diagnosis Date   Arthritis    Asystole (HCC)    electrolyte imbalance   B12 deficiency    Chronic diarrhea    Diabetes mellitus without complication (HCC)    borderline   GERD (gastroesophageal reflux disease)    History of colon polyps    History of hiatal hernia    History of kidney stones    History of supraventricular tachycardia    Hoarseness of voice    Hypertension    Osteoarthritis    Other constipation    Peripheral neuropathy    Psoriasis      Past Surgical History:  Procedure Laterality Date   ABDOMINAL HYSTERECTOMY     BREAST BIOPSY Left 01/29/2003   Fibrocystic changes, ductal adenosis, microcalcifications   BREAST BIOPSY Left 03/29/2017   Affirm Bx- SUPERIOR, CENTRAL, POSTERIOR; STEREOTACTIC BIOPSY: ORGANIZING FAT NECROSIS   BREAST SURGERY Left November 2004   Fibrocystic changes, ductal adenosis, microcalcifications   COLONOSCOPY  2013   Dr Viktoria   COLONOSCOPY WITH PROPOFOL  N/A 10/29/2017   Procedure: COLONOSCOPY WITH PROPOFOL ;  Surgeon: Viktoria Lamar DASEN, MD;  Location: [facility] ENDOSCOPY;  Service: Endoscopy;  Laterality: N/A;   CYSTOSCOPY/URETEROSCOPY/HOLMIUM LASER/STENT PLACEMENT Right 02/25/2019   Procedure: CYSTOSCOPY/URETEROSCOPY//STENT PLACEMENT;  Surgeon: Francisca Redell BROCKS, MD;  Location: [facility] ORS;  Service: Urology;  Laterality: Right;   CYSTOSCOPY/URETEROSCOPY/HOLMIUM LASER/STENT PLACEMENT Right 03/17/2019   Procedure: CYSTOSCOPY/URETEROSCOPY/HOLMIUM LASER/STENT Exchange;  Surgeon: Francisca Redell BROCKS, MD;  Location: [facility] ORS;  Service: Urology;  Laterality: Right;    ESOPHAGOGASTRODUODENOSCOPY     EYE SURGERY     Cataract surgery   FLEXIBLE SIGMOIDOSCOPY     HERNIA REPAIR  March 2011   [facility], intrathoracic hiatal hernia   KNEE ARTHROSCOPY Left 2014     Current Outpatient Medications  Medication Sig Dispense Refill   aspirin 81 MG EC tablet Take 81 mg by mouth daily.     calcium-vitamin D (OSCAL WITH D) 500-200 MG-UNIT per tablet Take 1 tablet by mouth daily.     docusate sodium  (COLACE) 100 MG capsule Take 100 mg by mouth 2 (two) times daily.     losartan  (COZAAR ) 25 MG tablet TAKE 1 TABLET (25 MG TOTAL) BY MOUTH DAILY. 90 tablet 3   metoprolol  succinate (TOPROL -XL) 25 MG 24 hr tablet Take 0.5 tablets (12.5 mg total) by mouth daily. 45 tablet 4   ondansetron  (ZOFRAN ) 4 MG tablet Take 1 tablet (4 mg total) by mouth every 8 (eight) hours as needed. 20 tablet 0   polyethylene glycol powder (GLYCOLAX /MIRALAX ) 17 GM/SCOOP powder Take 17 g by mouth daily as needed.     rosuvastatin (CRESTOR) 20 MG tablet TAKE 1 TABLET BY MOUTH EVERY DAY 90 tablet 1   spironolactone  (ALDACTONE ) 25 MG tablet TAKE 1 TABLET (25 MG TOTAL) BY MOUTH DAILY. 90 tablet 1   nitroGLYCERIN (NITROSTAT) 0.4 MG SL tablet Place under the tongue. (Patient not taking: Reported on 12/21/2023)     No current facility-administered medications for this visit.    Allergies:  Codeine, Lisinopril, Morphine  and codeine, and Zoloft [sertraline hcl]    Social History:   reports that she has never smoked. She has never used smokeless tobacco. She reports that she does not drink alcohol and does not use drugs.   Family History:  family history is not on file.    ROS:     Review of Systems  Constitutional: Negative.   HENT: Negative.    Eyes: Negative.   Respiratory: Negative.    Gastrointestinal: Negative.   Genitourinary: Negative.   Musculoskeletal: Negative.   Skin: Negative.   Neurological: Negative.   Endo/Heme/Allergies: Negative.   Psychiatric/Behavioral: Negative.    All other  systems reviewed and are negative.     All other systems are reviewed and negative.    PHYSICAL EXAM: VS:  BP 108/73   Pulse (!) 54   Ht 5' 3 (1.6 m)   Wt 137 lb 3.2 oz (62.2 kg)   SpO2 95%   BMI 24.30 kg/m  , BMI Body mass index is 24.3 kg/m. Last weight:  Wt Readings from Last 3 Encounters:  12/21/23 137 lb 3.2 oz (62.2 kg)  08/17/23 129 lb (58.5 kg)  05/17/23 130 lb (59 kg)     Physical Exam Constitutional:      Appearance: Normal appearance.  Cardiovascular:     Rate and Rhythm: Normal rate and regular rhythm.     Heart sounds: Normal heart sounds.  Pulmonary:     Effort: Pulmonary effort is normal.     Breath sounds: Normal breath sounds.  Musculoskeletal:     Right lower leg: No edema.     Left lower leg: No edema.  Neurological:     Mental Status: She is alert.       EKG:   Recent Labs: No results found for requested labs within last 365 days.    Lipid Panel No results found for: CHOL, TRIG, HDL, CHOLHDL, VLDL, LDLCALC, LDLDIRECT    Other studies Reviewed: Additional studies/ records that were reviewed today include:  Review of the above records demonstrates:       No data to display            ASSESSMENT AND PLAN:    ICD-10-CM   1. Nonrheumatic mitral valve regurgitation  I34.0 PCV ECHOCARDIOGRAM COMPLETE    2. Nonrheumatic aortic valve insufficiency  I35.1 PCV ECHOCARDIOGRAM COMPLETE    3. Mixed hyperlipidemia  E78.2 PCV ECHOCARDIOGRAM COMPLETE    4. Coronary artery disease involving native coronary artery of native heart with angina pectoris  I25.119 PCV ECHOCARDIOGRAM COMPLETE   No chest pain or SOB.    5. Aortic atherosclerosis  I70.0 PCV ECHOCARDIOGRAM COMPLETE    6. Primary hypertension  I10 PCV ECHOCARDIOGRAM COMPLETE       Problem List Items Addressed This Visit       Cardiovascular and Mediastinum   HTN (hypertension)   Relevant Orders   PCV ECHOCARDIOGRAM COMPLETE   Aortic atherosclerosis    Relevant Orders   PCV ECHOCARDIOGRAM COMPLETE   Coronary artery disease involving native coronary artery of native heart with angina pectoris   Relevant Orders   PCV ECHOCARDIOGRAM COMPLETE     Other   Mixed hyperlipidemia   Relevant Orders   PCV ECHOCARDIOGRAM COMPLETE   Other Visit Diagnoses       Nonrheumatic mitral valve regurgitation    -  Primary   Relevant Orders   PCV ECHOCARDIOGRAM COMPLETE     Nonrheumatic aortic valve insufficiency  Relevant Orders   PCV ECHOCARDIOGRAM COMPLETE          Disposition:   Return in about 3 weeks (around 01/11/2024) for echo and f/u.    Total time spent: 35 minutes  Signed,  Denyse Bathe, MD  12/21/2023 10:56 AM    [facility]

## 2023-12-23 ENCOUNTER — Other Ambulatory Visit: Payer: Self-pay | Admitting: Cardiovascular Disease

## 2023-12-23 DIAGNOSIS — I2510 Atherosclerotic heart disease of native coronary artery without angina pectoris: Secondary | ICD-10-CM

## 2023-12-24 ENCOUNTER — Other Ambulatory Visit: Payer: Self-pay | Admitting: Cardiovascular Disease

## 2023-12-24 DIAGNOSIS — R079 Chest pain, unspecified: Secondary | ICD-10-CM

## 2024-01-21 ENCOUNTER — Ambulatory Visit

## 2024-01-21 DIAGNOSIS — I340 Nonrheumatic mitral (valve) insufficiency: Secondary | ICD-10-CM | POA: Diagnosis not present

## 2024-01-21 DIAGNOSIS — I25119 Atherosclerotic heart disease of native coronary artery with unspecified angina pectoris: Secondary | ICD-10-CM

## 2024-01-21 DIAGNOSIS — I361 Nonrheumatic tricuspid (valve) insufficiency: Secondary | ICD-10-CM | POA: Diagnosis not present

## 2024-01-21 DIAGNOSIS — E782 Mixed hyperlipidemia: Secondary | ICD-10-CM

## 2024-01-21 DIAGNOSIS — I351 Nonrheumatic aortic (valve) insufficiency: Secondary | ICD-10-CM

## 2024-01-21 DIAGNOSIS — I10 Essential (primary) hypertension: Secondary | ICD-10-CM

## 2024-01-21 DIAGNOSIS — I700 Atherosclerosis of aorta: Secondary | ICD-10-CM

## 2024-01-24 ENCOUNTER — Ambulatory Visit: Admitting: Cardiovascular Disease

## 2024-01-25 ENCOUNTER — Ambulatory Visit: Admitting: Cardiovascular Disease

## 2024-01-25 ENCOUNTER — Encounter: Payer: Self-pay | Admitting: Cardiovascular Disease

## 2024-01-25 VITALS — BP 115/76 | HR 56 | Ht 63.0 in | Wt 138.4 lb

## 2024-01-25 DIAGNOSIS — E782 Mixed hyperlipidemia: Secondary | ICD-10-CM | POA: Diagnosis not present

## 2024-01-25 DIAGNOSIS — I351 Nonrheumatic aortic (valve) insufficiency: Secondary | ICD-10-CM

## 2024-01-25 DIAGNOSIS — I5189 Other ill-defined heart diseases: Secondary | ICD-10-CM | POA: Diagnosis not present

## 2024-01-25 DIAGNOSIS — I10 Essential (primary) hypertension: Secondary | ICD-10-CM | POA: Diagnosis not present

## 2024-01-25 DIAGNOSIS — I340 Nonrheumatic mitral (valve) insufficiency: Secondary | ICD-10-CM | POA: Diagnosis not present

## 2024-01-25 DIAGNOSIS — I25119 Atherosclerotic heart disease of native coronary artery with unspecified angina pectoris: Secondary | ICD-10-CM

## 2024-01-25 NOTE — Progress Notes (Signed)
 Cardiology Office Note   Date:  01/25/2024   ID:  Ashley, Tanner Mar 26, 1941, MRN [id number]  PCP:  Ashley Amis, MD  Cardiologist:  Ashley Bathe, MD      History of Present Illness: Ashley Tanner is a 83 y.o. female who presents for  Chief Complaint  Patient presents with   Follow-up    3 week echo results    No chest pain or SOB.      Past Medical History:  Diagnosis Date   Arthritis    Asystole (HCC)    electrolyte imbalance   B12 deficiency    Chronic diarrhea    Diabetes mellitus without complication (HCC)    borderline   GERD (gastroesophageal reflux disease)    History of colon polyps    History of hiatal hernia    History of kidney stones    History of supraventricular tachycardia    Hoarseness of voice    Hypertension    Osteoarthritis    Other constipation    Peripheral neuropathy    Psoriasis      Past Surgical History:  Procedure Laterality Date   ABDOMINAL HYSTERECTOMY     BREAST BIOPSY Left 01/29/2003   Fibrocystic changes, ductal adenosis, microcalcifications   BREAST BIOPSY Left 03/29/2017   Affirm Bx- SUPERIOR, CENTRAL, POSTERIOR; STEREOTACTIC BIOPSY: ORGANIZING FAT NECROSIS   BREAST SURGERY Left November 2004   Fibrocystic changes, ductal adenosis, microcalcifications   COLONOSCOPY  2013   Dr Ashley   COLONOSCOPY WITH PROPOFOL  N/A 10/29/2017   Procedure: COLONOSCOPY WITH PROPOFOL ;  Surgeon: Ashley Lamar DASEN, MD;  Location: [facility] ENDOSCOPY;  Service: Endoscopy;  Laterality: N/A;   CYSTOSCOPY/URETEROSCOPY/HOLMIUM LASER/STENT PLACEMENT Right 02/25/2019   Procedure: CYSTOSCOPY/URETEROSCOPY//STENT PLACEMENT;  Surgeon: Ashley Redell BROCKS, MD;  Location: [facility] ORS;  Service: Urology;  Laterality: Right;   CYSTOSCOPY/URETEROSCOPY/HOLMIUM LASER/STENT PLACEMENT Right 03/17/2019   Procedure: CYSTOSCOPY/URETEROSCOPY/HOLMIUM LASER/STENT Exchange;  Surgeon: Ashley Redell BROCKS, MD;  Location: [facility] ORS;  Service: Urology;  Laterality:  Right;   ESOPHAGOGASTRODUODENOSCOPY     EYE SURGERY     Cataract surgery   FLEXIBLE SIGMOIDOSCOPY     HERNIA REPAIR  March 2011   [facility], intrathoracic hiatal hernia   KNEE ARTHROSCOPY Left 2014     Current Outpatient Medications  Medication Sig Dispense Refill   aspirin 81 MG EC tablet Take 81 mg by mouth daily.     calcium-vitamin D (OSCAL WITH D) 500-200 MG-UNIT per tablet Take 1 tablet by mouth daily.     docusate sodium  (COLACE) 100 MG capsule Take 100 mg by mouth 2 (two) times daily.     losartan  (COZAAR ) 25 MG tablet TAKE 1 TABLET (25 MG TOTAL) BY MOUTH DAILY. 90 tablet 3   metoprolol  succinate (TOPROL -XL) 25 MG 24 hr tablet TAKE 1/2 TABLET BY MOUTH EVERY DAY 45 tablet 4   ondansetron  (ZOFRAN ) 4 MG tablet Take 1 tablet (4 mg total) by mouth every 8 (eight) hours as needed. 20 tablet 0   polyethylene glycol powder (GLYCOLAX /MIRALAX ) 17 GM/SCOOP powder Take 17 g by mouth daily as needed.     rosuvastatin (CRESTOR) 20 MG tablet TAKE 1 TABLET BY MOUTH EVERY DAY 90 tablet 1   spironolactone  (ALDACTONE ) 25 MG tablet TAKE 1 TABLET (25 MG TOTAL) BY MOUTH DAILY. 90 tablet 1   nitroGLYCERIN (NITROSTAT) 0.4 MG SL tablet Place under the tongue. (Patient not taking: Reported on 01/25/2024)     No current facility-administered medications for this visit.    Allergies:  Codeine, Lisinopril, Morphine  and codeine, and Zoloft [sertraline hcl]    Social History:   reports that she has never smoked. She has never used smokeless tobacco. She reports that she does not drink alcohol and does not use drugs.   Family History:  family history is not on file.    ROS:     Review of Systems  Constitutional: Negative.   HENT: Negative.    Eyes: Negative.   Respiratory: Negative.    Gastrointestinal: Negative.   Genitourinary: Negative.   Musculoskeletal: Negative.   Skin: Negative.   Neurological: Negative.   Endo/Heme/Allergies: Negative.   Psychiatric/Behavioral: Negative.    All other  systems reviewed and are negative.     All other systems are reviewed and negative.    PHYSICAL EXAM: VS:  BP 115/76   Pulse (!) 56   Ht 5' 3 (1.6 m)   Wt 138 lb 6.4 oz (62.8 kg)   SpO2 96%   BMI 24.52 kg/m  , BMI Body mass index is 24.52 kg/m. Last weight:  Wt Readings from Last 3 Encounters:  01/25/24 138 lb 6.4 oz (62.8 kg)  12/21/23 137 lb 3.2 oz (62.2 kg)  08/17/23 129 lb (58.5 kg)     Physical Exam Constitutional:      Appearance: Normal appearance.  Cardiovascular:     Rate and Rhythm: Normal rate and regular rhythm.     Heart sounds: Normal heart sounds.  Pulmonary:     Effort: Pulmonary effort is normal.     Breath sounds: Normal breath sounds.  Musculoskeletal:     Right lower leg: No edema.     Left lower leg: No edema.  Neurological:     Mental Status: She is alert.       EKG:   Recent Labs: No results found for requested labs within last 365 days.    Lipid Panel No results found for: CHOL, TRIG, HDL, CHOLHDL, VLDL, LDLCALC, LDLDIRECT    Other studies Reviewed: Additional studies/ records that were reviewed today include:  Review of the above records demonstrates:       No data to display            ASSESSMENT AND PLAN:    ICD-10-CM   1. Diastolic dysfunction without heart failure  I51.89    ECHO showed grade 2 diastolic dysunction but not SOB. On aldactone .    2. Primary hypertension  I10     3. Mixed hyperlipidemia  E78.2     4. Nonrheumatic aortic valve insufficiency  I35.1    mild    5. Nonrheumatic mitral valve regurgitation  I34.0    mild    6. Coronary artery disease involving native coronary artery of native heart with angina pectoris  I25.119     7. Nonrheumatic aortic (valve) insufficiency  I35.1        Problem List Items Addressed This Visit       Cardiovascular and Mediastinum   HTN (hypertension)   Coronary artery disease involving native coronary artery of native heart with angina  pectoris     Other   Mixed hyperlipidemia   Other Visit Diagnoses       Diastolic dysfunction without heart failure    -  Primary   ECHO showed grade 2 diastolic dysunction but not SOB. On aldactone .     Nonrheumatic aortic valve insufficiency       mild     Nonrheumatic mitral valve regurgitation       mild  Nonrheumatic aortic (valve) insufficiency              Disposition:   Return in about 3 months (around 04/26/2024).    Total time spent: 35 minutes  Signed,  Ashley Bathe, MD  01/25/2024 11:08 AM    [facility]

## 2024-03-12 ENCOUNTER — Ambulatory Visit: Admission: EM | Admit: 2024-03-12 | Discharge: 2024-03-12

## 2024-03-12 ENCOUNTER — Encounter: Payer: Self-pay | Admitting: Emergency Medicine

## 2024-03-12 ENCOUNTER — Emergency Department: Admission: EM | Admit: 2024-03-12 | Discharge: 2024-03-12 | Disposition: A | Discharge status: Home / Self Care

## 2024-03-12 ENCOUNTER — Other Ambulatory Visit: Payer: Self-pay

## 2024-03-12 DIAGNOSIS — M542 Cervicalgia: Secondary | ICD-10-CM

## 2024-03-12 DIAGNOSIS — R52 Pain, unspecified: Secondary | ICD-10-CM

## 2024-03-12 MED ORDER — METHOCARBAMOL 500 MG PO TABS: 500.0000 mg | ORAL_TABLET | Freq: Three times a day (TID) | ORAL | 0 refills | Status: AC

## 2024-03-12 MED ORDER — IBUPROFEN 600 MG PO TABS
600.0000 mg | ORAL_TABLET | Freq: Once | ORAL | Status: AC
  Administered 2024-03-12: 600 mg via ORAL
  Filled 2024-03-12: qty 1

## 2024-03-12 MED ORDER — DIAZEPAM 2 MG PO TABS
2.0000 mg | ORAL_TABLET | Freq: Once | ORAL | Status: AC
  Administered 2024-03-12: 2 mg via ORAL
  Filled 2024-03-12: qty 1

## 2024-03-12 NOTE — ED Provider Notes (Signed)
 [facility] Provider Note    Event Date/Time   First MD Initiated Contact with Patient 03/12/24 1714     (approximate)   History   Neck Pain   HPI  Ashley Tanner is a 83 y.o. female presenting with complaint of left-sided neck pain.  Notes that initially yesterday, states that worsened throughout the day, primarily in the lateral left side of the neck, worsened with moving her head, no other affiliated symptoms.  Denying fevers chills nausea vomiting visual changes numbness tingling weakness or any other associated symptoms.  She tried changing some of her pillows throughout the night without much relief of symptoms decided to go in to urgent care to be further evaluated.  Apparently while there there was concern of severe headache associated with her symptoms, and she was urged to come into the emergency department.  She continues to complain of the pain.  Denies any recent trauma or any injuries.  Has not taken any medications to assist with the symptoms.  Denies any known history of diabetes or Renal disease.  No smoking history.      Physical Exam   Triage Vital Signs: ED Triage Vitals  Encounter Vitals Group     BP 03/12/24 1312 (!) 145/74     Girls Systolic BP Percentile --      Girls Diastolic BP Percentile --      Boys Systolic BP Percentile --      Boys Diastolic BP Percentile --      Pulse Rate 03/12/24 1312 (!) 50     Resp 03/12/24 1312 16     Temp 03/12/24 1312 98 F (36.7 C)     Temp Source 03/12/24 1312 Oral     SpO2 03/12/24 1312 99 %     Weight 03/12/24 1313 138 lb 7.2 oz (62.8 kg)     Height --      Head Circumference --      Peak Flow --      Pain Score 03/12/24 1312 10     Pain Loc --      Pain Education --      Exclude from Growth Chart --     Most recent vital signs: Vitals:   03/12/24 1742 03/12/24 1746  BP:  (!) 158/66  Pulse:  (!) 56  Resp:  16  Temp:  (!) 97.5 F (36.4 C)  SpO2: 99% 100%      General: Awake, no distress.  Neck:  There is tenderness to palpation of across the lateral left neck, I am not appreciating any auscultated carotid bruits, no deformities no noted crepitus.  No overlying erythema.  Limited range of motion secondary to rotating the head to the right or the left, but no noted Krasinski or Bernick sign, is able to bring her chin down to her chest CV:  Good peripheral perfusion.  Resp:  Normal effort.  Abd:  No distention.  Neuro:  Cranial nerves II through XII are intact, appropriate vision bilaterally, and extraocular motions are normal.  Appropriate sensation and strength in the bilateral upper and lower extremities Other:     ED Results / Procedures / Treatments   Labs (all labs ordered are listed, but only abnormal results are displayed) Labs Reviewed - No data to display   EKG     RADIOLOGY   PROCEDURES:  Critical Care performed: No  Procedures   MEDICATIONS ORDERED IN ED: Medications  diazepam  (VALIUM ) tablet 2 mg (2 mg Oral  Given 03/12/24 1745)  ibuprofen  (ADVIL ) tablet 600 mg (600 mg Oral Given 03/12/24 1745)     IMPRESSION / MDM / ASSESSMENT AND PLAN / ED COURSE  I reviewed the triage vital signs and the nursing notes.                               Patient's presentation is most consistent with acute, uncomplicated illness.  83 year old female who presents today with concern of left-sided neck pain.  Clinically this appears consistent with a musculoskeletal etiology.  She has not taken any medications to help with her symptoms.  She does not appear to be in acute distress, slightly hypertensive here but feel not a hypertensive emergency or urgency.  She has no noted neurodeficits, I feel very unlikely a vascular cause or neurologic cause.  Will attempt symptomatic management I suspect reasonable for discharge home afterwards.   Clinical Course as of 03/12/24 2346  Sun Mar 12, 2024  [id number] Patient's range of motion has  slightly improved.  I believe reasonable for discharge home at this time.  Discussed return precautions, patient will follow-up with her outpatient provider.  She is agreeable with the plan. [SK]    Clinical Course User Index [SK] Fernand Rossie HERO, MD     FINAL CLINICAL IMPRESSION(S) / ED DIAGNOSES   Final diagnoses:  Neck pain     Rx / DC Orders   ED Discharge Orders          Ordered    methocarbamol  (ROBAXIN ) 500 MG tablet  3 times daily        03/12/24 1834             Note:  This document was prepared using Dragon voice recognition software and may include unintentional dictation errors.   Fernand Rossie HERO, MD 03/12/24 [phone]

## 2024-03-12 NOTE — Discharge Instructions (Addendum)
 Go to the emergency room for further evaluation of severe neck and head pain 10/10.

## 2024-03-12 NOTE — Discharge Instructions (Signed)
 You were seen today due to concern of neck pain.  At this time fortunately I suspect your symptoms from for a muscular cause.  I have written for a muscle relaxer, please take this as instructed.  If you have any worsening of symptoms such as change in vision, severe increase in pain, headache, numbness down your arms, weakness, or any other symptoms you find concerning please return to the emergency department immediately for further medical management.

## 2024-03-12 NOTE — ED Provider Notes (Signed)
 [facility]    CSN: [id number] Arrival date & time: 03/12/24  1155      History   Chief Complaint Chief Complaint  Patient presents with   Neck Pain    HPI Ashley Tanner is a 83 y.o. female.   83 year old female, Ashley Tanner, presents to urgent care for evaluation of severe sharp posterior neck pain that radiates up to the top of her head that started yesterday.  Patient denies injury or fall, patient took Tylenol  at 8 AM without relief, pt is tearful in room,  denies nausea and vomiting. Rates the pain as 10/10  worst pain ever, never had anything like this before. Pt denies URI symptoms or additional concerns. Pt is afebrile. GCS is 15  The history is provided by the patient. No language interpreter was used.    Past Medical History:  Diagnosis Date   Arthritis    Asystole (HCC)    electrolyte imbalance   B12 deficiency    Chronic diarrhea    Diabetes mellitus without complication (HCC)    borderline   GERD (gastroesophageal reflux disease)    History of colon polyps    History of hiatal hernia    History of kidney stones    History of supraventricular tachycardia    Hoarseness of voice    Hypertension    Osteoarthritis    Other constipation    Peripheral neuropathy    Psoriasis     Patient Active Problem List   Diagnosis Date Noted   Severe pain 03/12/2024   Neck pain 03/12/2024   Other chest pain 10/08/2022   Coronary artery disease involving native coronary artery of native heart with angina pectoris 07/20/2022   Mixed hyperlipidemia 07/20/2022   COVID-19 virus infection 06/14/2021   Inadequate oral intake 06/14/2021   History of cardiac arrest 01/16/2021   Aortic atherosclerosis 12/01/2019   Cardiac arrest (HCC) 02/24/2019   Right renal stone 02/24/2019   Nausea in adult 02/24/2019   Syncope 02/24/2019   Hypokalemia 02/24/2019   Acute lower UTI 02/24/2019   Diabetes mellitus, type 2 (HCC) 04/19/2018   HTN (hypertension)  04/19/2018   Proctocolitis 04/19/2018   Slow transit constipation 08/19/2017   Encounter for general adult medical examination without abnormal findings 06/16/2016   Situational anxiety 06/16/2016   Sliding hiatal hernia 01/30/2016   Hoarseness of voice 12/10/2015   Borderline diabetes mellitus 06/17/2015   Vaccine counseling 06/10/2015   Breast mass    Abnormal mammogram 08/10/2012    Past Surgical History:  Procedure Laterality Date   ABDOMINAL HYSTERECTOMY     BREAST BIOPSY Left 01/29/2003   Fibrocystic changes, ductal adenosis, microcalcifications   BREAST BIOPSY Left 03/29/2017   Affirm Bx- SUPERIOR, CENTRAL, POSTERIOR; STEREOTACTIC BIOPSY: ORGANIZING FAT NECROSIS   BREAST SURGERY Left November 2004   Fibrocystic changes, ductal adenosis, microcalcifications   COLONOSCOPY  2013   Dr Viktoria   COLONOSCOPY WITH PROPOFOL  N/A 10/29/2017   Procedure: COLONOSCOPY WITH PROPOFOL ;  Surgeon: Viktoria Lamar DASEN, MD;  Location: [facility] ENDOSCOPY;  Service: Endoscopy;  Laterality: N/A;   CYSTOSCOPY/URETEROSCOPY/HOLMIUM LASER/STENT PLACEMENT Right 02/25/2019   Procedure: CYSTOSCOPY/URETEROSCOPY//STENT PLACEMENT;  Surgeon: Francisca Redell BROCKS, MD;  Location: [facility] ORS;  Service: Urology;  Laterality: Right;   CYSTOSCOPY/URETEROSCOPY/HOLMIUM LASER/STENT PLACEMENT Right 03/17/2019   Procedure: CYSTOSCOPY/URETEROSCOPY/HOLMIUM LASER/STENT Exchange;  Surgeon: Francisca Redell BROCKS, MD;  Location: [facility] ORS;  Service: Urology;  Laterality: Right;   ESOPHAGOGASTRODUODENOSCOPY     EYE SURGERY     Cataract surgery  FLEXIBLE SIGMOIDOSCOPY     HERNIA REPAIR  March 2011   [facility], intrathoracic hiatal hernia   KNEE ARTHROSCOPY Left 2014    OB History     Gravida  0   Para      Term      Preterm      AB      Living         SAB      IAB      Ectopic      Multiple      Live Births           Obstetric Comments  Age first period 80          Home Medications    Prior to Admission  medications  Medication Sig Start Date End Date Taking? Authorizing Provider  aspirin 81 MG EC tablet Take 81 mg by mouth daily. 05/25/19   [provider]  calcium-vitamin D (OSCAL WITH D) 500-200 MG-UNIT per tablet Take 1 tablet by mouth daily.    [provider]  docusate sodium  (COLACE) 100 MG capsule Take 100 mg by mouth 2 (two) times daily.    [provider]  losartan  (COZAAR ) 25 MG tablet TAKE 1 TABLET (25 MG TOTAL) BY MOUTH DAILY. 11/26/23 11/25/24  Fernand Denyse LABOR, MD  metoprolol  succinate (TOPROL -XL) 25 MG 24 hr tablet TAKE 1/2 TABLET BY MOUTH EVERY DAY 12/24/23   Fernand Denyse LABOR, MD  nitroGLYCERIN (NITROSTAT) 0.4 MG SL tablet Place under the tongue. Patient not taking: Reported on 01/25/2024 07/04/19   [provider]  ondansetron  (ZOFRAN ) 4 MG tablet Take 1 tablet (4 mg total) by mouth every 8 (eight) hours as needed. 06/10/21   Goodman, Graydon, MD  polyethylene glycol powder (GLYCOLAX /MIRALAX ) 17 GM/SCOOP powder Take 17 g by mouth daily as needed. 04/21/18   [provider]  rosuvastatin (CRESTOR) 20 MG tablet TAKE 1 TABLET BY MOUTH EVERY DAY 12/23/23   Fernand Denyse LABOR, MD  spironolactone  (ALDACTONE ) 25 MG tablet TAKE 1 TABLET (25 MG TOTAL) BY MOUTH DAILY. 12/07/22 01/25/24  Fernand Denyse LABOR, MD    Family History Family History  Problem Relation Age of Onset   Prostate cancer Neg Hx    Bladder Cancer Neg Hx    Kidney cancer Neg Hx     Social History Social History[1]   Allergies   Codeine, Lisinopril, Morphine  and codeine, and Zoloft [sertraline hcl]   Review of Systems Review of Systems  Constitutional:  Negative for fever.  Gastrointestinal:  Negative for nausea and vomiting.  Musculoskeletal:  Positive for neck pain.  Neurological:  Positive for headaches.  All other systems reviewed and are negative.    Physical Exam Triage Vital Signs ED Triage Vitals  Encounter Vitals Group     BP 03/12/24 1221 131/81     Girls  Systolic BP Percentile --      Girls Diastolic BP Percentile --      Boys Systolic BP Percentile --      Boys Diastolic BP Percentile --      Pulse Rate 03/12/24 1221 (!) 54     Resp 03/12/24 1221 15     Temp 03/12/24 1221 98 F (36.7 C)     Temp Source 03/12/24 1221 Oral     SpO2 03/12/24 1221 98 %     Weight 03/12/24 1219 138 lb 7.2 oz (62.8 kg)     Height 03/12/24 1219 5' 3 (1.6 m)     Head  Circumference --      Peak Flow --      Pain Score 03/12/24 1219 10     Pain Loc --      Pain Education --      Exclude from Growth Chart --    No data found.  Updated Vital Signs BP 131/81 (BP Location: Left Arm)   Pulse (!) 54   Temp 98 F (36.7 C) (Oral)   Resp 15   Ht 5' 3 (1.6 m)   Wt 138 lb 7.2 oz (62.8 kg)   SpO2 98%   BMI 24.53 kg/m   Visual Acuity Right Eye Distance:   Left Eye Distance:   Bilateral Distance:    Right Eye Near:   Left Eye Near:    Bilateral Near:     Physical Exam Vitals and nursing note reviewed.  HENT:     Head: Normocephalic.  Eyes:     Pupils: Pupils are equal, round, and reactive to light.  Pulmonary:     Effort: Pulmonary effort is normal.  Musculoskeletal:     Cervical back: Tenderness present. No swelling, edema, erythema or signs of trauma. Pain with movement present.  Skin:    General: Skin is warm.     Capillary Refill: Capillary refill takes less than 2 seconds.  Neurological:     General: No focal deficit present.     Mental Status: She is alert and oriented to person, place, and time.     GCS: GCS eye subscore is 4. GCS verbal subscore is 5. GCS motor subscore is 6.  Psychiatric:        Attention and Perception: Attention normal.        Mood and Affect: Affect is tearful.        Speech: Speech normal.      UC Treatments / Results  Labs (all labs ordered are listed, but only abnormal results are displayed) Labs Reviewed - No data to display  EKG   Radiology No results found.  Procedures Procedures (including  critical care time)  Medications Ordered in UC Medications - No data to display  Initial Impression / Assessment and Plan / UC Course  I have reviewed the triage vital signs and the nursing notes.  Pertinent labs & imaging results that were available during my care of the patient were reviewed by me and considered in my medical decision making (see chart for details).    Discussed with pt and friend need for further evaluation in the ER (higher level of care) due to severity 10/10 pain, no labs or CT available in urgent care.  Patient verbalized understanding to this provider, friend will take patient POV to ER for further evaluation.  Ddx: Severe HA, severe neck pain, CVA, Cervicogenic HA, occipital neuralgia, torticollis,arthritis,viral illness Final Clinical Impressions(s) / UC Diagnoses   Final diagnoses:  Severe pain  Neck pain     Discharge Instructions      Go to the emergency room for further evaluation of severe neck and head pain 10/10.     ED Prescriptions   None    PDMP not reviewed this encounter.     [1]  Social History Tobacco Use   Smoking status: Never   Smokeless tobacco: Never  Vaping Use   Vaping status: Never Used  Substance Use Topics   Alcohol use: No   Drug use: No     Deena Shaub, Rilla, NP 03/12/24 1259

## 2024-03-12 NOTE — ED Triage Notes (Signed)
 Patient c/o pain in the back of her neck that radiates up to the top of her head and started yesterday.  Patient denies injury or fall.  Patient took Tylenol  8am this morning.  Patient denies N/V.

## 2024-03-12 NOTE — ED Triage Notes (Signed)
 C/O pain to posterior neck since yesterday. STates pain worsening today and radiating up to head.  Denies injury or fall. Tylenol  taken at 0800 with no relief.  AAOx3. Skin warm and dry. NAD

## 2024-04-25 ENCOUNTER — Ambulatory Visit: Admitting: Cardiovascular Disease

## 2024-05-02 ENCOUNTER — Ambulatory Visit: Admitting: Cardiovascular Disease

## 2024-05-16 ENCOUNTER — Ambulatory Visit: Admitting: Cardiovascular Disease
